# Patient Record
Sex: Male | Born: 1955 | Race: Black or African American | Hispanic: No | Marital: Single | Smoking: Current every day smoker
Health system: Southern US, Community
[De-identification: ages and names within clinical notes are randomized; demographics above are authoritative.]

## PROBLEM LIST (undated history)

## (undated) DIAGNOSIS — J449 Chronic obstructive pulmonary disease, unspecified: Secondary | ICD-10-CM

## (undated) DIAGNOSIS — I272 Pulmonary hypertension, unspecified: Secondary | ICD-10-CM

## (undated) DIAGNOSIS — F1020 Alcohol dependence, uncomplicated: Secondary | ICD-10-CM

## (undated) DIAGNOSIS — I5032 Chronic diastolic (congestive) heart failure: Secondary | ICD-10-CM

## (undated) DIAGNOSIS — F17200 Nicotine dependence, unspecified, uncomplicated: Secondary | ICD-10-CM

## (undated) DIAGNOSIS — I4892 Unspecified atrial flutter: Secondary | ICD-10-CM

## (undated) DIAGNOSIS — I10 Essential (primary) hypertension: Secondary | ICD-10-CM

## (undated) HISTORY — DX: Chronic diastolic (congestive) heart failure: I50.32

## (undated) HISTORY — DX: Chronic obstructive pulmonary disease, unspecified: J44.9

## (undated) HISTORY — DX: Unspecified atrial flutter: I48.92

## (undated) HISTORY — DX: Pulmonary hypertension, unspecified: I27.20

## (undated) HISTORY — PX: OTHER SURGICAL HISTORY: SHX169

---

## 2019-09-27 ENCOUNTER — Encounter (HOSPITAL_BASED_OUTPATIENT_CLINIC_OR_DEPARTMENT_OTHER): Payer: Self-pay | Admitting: *Deleted

## 2019-09-27 ENCOUNTER — Emergency Department (HOSPITAL_BASED_OUTPATIENT_CLINIC_OR_DEPARTMENT_OTHER): Payer: Medicare Other

## 2019-09-27 ENCOUNTER — Other Ambulatory Visit: Payer: Self-pay

## 2019-09-27 ENCOUNTER — Inpatient Hospital Stay (HOSPITAL_BASED_OUTPATIENT_CLINIC_OR_DEPARTMENT_OTHER)
Admission: EM | Admit: 2019-09-27 | Discharge: 2019-10-03 | DRG: 286 | Disposition: A | Discharge status: Home Health | Payer: Medicare Other | Attending: Internal Medicine | Admitting: Internal Medicine

## 2019-09-27 DIAGNOSIS — Z8249 Family history of ischemic heart disease and other diseases of the circulatory system: Secondary | ICD-10-CM

## 2019-09-27 DIAGNOSIS — T501X5A Adverse effect of loop [high-ceiling] diuretics, initial encounter: Secondary | ICD-10-CM | POA: Diagnosis not present

## 2019-09-27 DIAGNOSIS — F1010 Alcohol abuse, uncomplicated: Secondary | ICD-10-CM | POA: Diagnosis present

## 2019-09-27 DIAGNOSIS — T508X5A Adverse effect of diagnostic agents, initial encounter: Secondary | ICD-10-CM | POA: Diagnosis not present

## 2019-09-27 DIAGNOSIS — I130 Hypertensive heart and chronic kidney disease with heart failure and stage 1 through stage 4 chronic kidney disease, or unspecified chronic kidney disease: Secondary | ICD-10-CM | POA: Diagnosis present

## 2019-09-27 DIAGNOSIS — E8770 Fluid overload, unspecified: Secondary | ICD-10-CM

## 2019-09-27 DIAGNOSIS — I082 Rheumatic disorders of both aortic and tricuspid valves: Secondary | ICD-10-CM | POA: Diagnosis present

## 2019-09-27 DIAGNOSIS — F1020 Alcohol dependence, uncomplicated: Secondary | ICD-10-CM | POA: Diagnosis present

## 2019-09-27 DIAGNOSIS — I10 Essential (primary) hypertension: Secondary | ICD-10-CM | POA: Diagnosis present

## 2019-09-27 DIAGNOSIS — I509 Heart failure, unspecified: Secondary | ICD-10-CM

## 2019-09-27 DIAGNOSIS — I774 Celiac artery compression syndrome: Secondary | ICD-10-CM | POA: Diagnosis present

## 2019-09-27 DIAGNOSIS — I361 Nonrheumatic tricuspid (valve) insufficiency: Secondary | ICD-10-CM | POA: Diagnosis not present

## 2019-09-27 DIAGNOSIS — D6959 Other secondary thrombocytopenia: Secondary | ICD-10-CM | POA: Diagnosis present

## 2019-09-27 DIAGNOSIS — N179 Acute kidney failure, unspecified: Secondary | ICD-10-CM

## 2019-09-27 DIAGNOSIS — F1090 Alcohol use, unspecified, uncomplicated: Secondary | ICD-10-CM | POA: Diagnosis present

## 2019-09-27 DIAGNOSIS — I471 Supraventricular tachycardia: Secondary | ICD-10-CM | POA: Diagnosis present

## 2019-09-27 DIAGNOSIS — I4892 Unspecified atrial flutter: Secondary | ICD-10-CM

## 2019-09-27 DIAGNOSIS — N141 Nephropathy induced by other drugs, medicaments and biological substances: Secondary | ICD-10-CM | POA: Diagnosis not present

## 2019-09-27 DIAGNOSIS — I50814 Right heart failure due to left heart failure: Secondary | ICD-10-CM

## 2019-09-27 DIAGNOSIS — J449 Chronic obstructive pulmonary disease, unspecified: Secondary | ICD-10-CM | POA: Diagnosis present

## 2019-09-27 DIAGNOSIS — I519 Heart disease, unspecified: Secondary | ICD-10-CM

## 2019-09-27 DIAGNOSIS — E785 Hyperlipidemia, unspecified: Secondary | ICD-10-CM | POA: Diagnosis present

## 2019-09-27 DIAGNOSIS — F17210 Nicotine dependence, cigarettes, uncomplicated: Secondary | ICD-10-CM | POA: Diagnosis present

## 2019-09-27 DIAGNOSIS — I5031 Acute diastolic (congestive) heart failure: Secondary | ICD-10-CM

## 2019-09-27 DIAGNOSIS — B9689 Other specified bacterial agents as the cause of diseases classified elsewhere: Secondary | ICD-10-CM | POA: Diagnosis present

## 2019-09-27 DIAGNOSIS — F17200 Nicotine dependence, unspecified, uncomplicated: Secondary | ICD-10-CM | POA: Diagnosis present

## 2019-09-27 DIAGNOSIS — I5043 Acute on chronic combined systolic (congestive) and diastolic (congestive) heart failure: Secondary | ICD-10-CM

## 2019-09-27 DIAGNOSIS — I351 Nonrheumatic aortic (valve) insufficiency: Secondary | ICD-10-CM | POA: Diagnosis not present

## 2019-09-27 DIAGNOSIS — J9601 Acute respiratory failure with hypoxia: Secondary | ICD-10-CM | POA: Diagnosis present

## 2019-09-27 DIAGNOSIS — N189 Chronic kidney disease, unspecified: Secondary | ICD-10-CM | POA: Diagnosis present

## 2019-09-27 DIAGNOSIS — K761 Chronic passive congestion of liver: Secondary | ICD-10-CM | POA: Diagnosis present

## 2019-09-27 DIAGNOSIS — I4719 Other supraventricular tachycardia: Secondary | ICD-10-CM | POA: Diagnosis present

## 2019-09-27 DIAGNOSIS — F1011 Alcohol abuse, in remission: Secondary | ICD-10-CM | POA: Diagnosis present

## 2019-09-27 DIAGNOSIS — I2729 Other secondary pulmonary hypertension: Secondary | ICD-10-CM | POA: Diagnosis present

## 2019-09-27 DIAGNOSIS — I5082 Biventricular heart failure: Secondary | ICD-10-CM | POA: Diagnosis present

## 2019-09-27 DIAGNOSIS — I272 Pulmonary hypertension, unspecified: Secondary | ICD-10-CM

## 2019-09-27 DIAGNOSIS — Z20822 Contact with and (suspected) exposure to covid-19: Secondary | ICD-10-CM | POA: Diagnosis present

## 2019-09-27 DIAGNOSIS — R0602 Shortness of breath: Secondary | ICD-10-CM | POA: Diagnosis present

## 2019-09-27 DIAGNOSIS — R8271 Bacteriuria: Secondary | ICD-10-CM | POA: Diagnosis present

## 2019-09-27 DIAGNOSIS — I5033 Acute on chronic diastolic (congestive) heart failure: Secondary | ICD-10-CM

## 2019-09-27 DIAGNOSIS — R000 Tachycardia, unspecified: Secondary | ICD-10-CM | POA: Diagnosis not present

## 2019-09-27 DIAGNOSIS — R945 Abnormal results of liver function studies: Secondary | ICD-10-CM | POA: Diagnosis present

## 2019-09-27 DIAGNOSIS — F1029 Alcohol dependence with unspecified alcohol-induced disorder: Secondary | ICD-10-CM | POA: Diagnosis not present

## 2019-09-27 HISTORY — DX: Nicotine dependence, unspecified, uncomplicated: F17.200

## 2019-09-27 HISTORY — DX: Essential (primary) hypertension: I10

## 2019-09-27 HISTORY — DX: Alcohol dependence, uncomplicated: F10.20

## 2019-09-27 LAB — URINALYSIS, COMPLETE (UACMP) WITH MICROSCOPIC
Bilirubin Urine: NEGATIVE
Glucose, UA: NEGATIVE mg/dL
Ketones, ur: NEGATIVE mg/dL
Nitrite: NEGATIVE
Protein, ur: 100 mg/dL — AB
Specific Gravity, Urine: 1.02 (ref 1.005–1.030)
pH: 6 (ref 5.0–8.0)

## 2019-09-27 LAB — CBC WITH DIFFERENTIAL/PLATELET
Abs Immature Granulocytes: 0.03 10*3/uL (ref 0.00–0.07)
Basophils Absolute: 0 10*3/uL (ref 0.0–0.1)
Basophils Relative: 1 %
Eosinophils Absolute: 0 10*3/uL (ref 0.0–0.5)
Eosinophils Relative: 1 %
HCT: 40.4 % (ref 39.0–52.0)
Hemoglobin: 14.1 g/dL (ref 13.0–17.0)
Immature Granulocytes: 1 %
Lymphocytes Relative: 42 %
Lymphs Abs: 2.6 10*3/uL (ref 0.7–4.0)
MCH: 33.7 pg (ref 26.0–34.0)
MCHC: 34.9 g/dL (ref 30.0–36.0)
MCV: 96.7 fL (ref 80.0–100.0)
Monocytes Absolute: 0.8 10*3/uL (ref 0.1–1.0)
Monocytes Relative: 13 %
Neutro Abs: 2.5 10*3/uL (ref 1.7–7.7)
Neutrophils Relative %: 42 %
Platelets: 130 10*3/uL — ABNORMAL LOW (ref 150–400)
RBC: 4.18 MIL/uL — ABNORMAL LOW (ref 4.22–5.81)
RDW: 16.8 % — ABNORMAL HIGH (ref 11.5–15.5)
WBC: 6 10*3/uL (ref 4.0–10.5)
nRBC: 0.3 % — ABNORMAL HIGH (ref 0.0–0.2)

## 2019-09-27 LAB — HEPATIC FUNCTION PANEL
ALT: 84 U/L — ABNORMAL HIGH (ref 0–44)
AST: 71 U/L — ABNORMAL HIGH (ref 15–41)
Albumin: 3.7 g/dL (ref 3.5–5.0)
Alkaline Phosphatase: 71 U/L (ref 38–126)
Bilirubin, Direct: 0.7 mg/dL — ABNORMAL HIGH (ref 0.0–0.2)
Indirect Bilirubin: 0.8 mg/dL (ref 0.3–0.9)
Total Bilirubin: 1.5 mg/dL — ABNORMAL HIGH (ref 0.3–1.2)
Total Protein: 7.6 g/dL (ref 6.5–8.1)

## 2019-09-27 LAB — BRAIN NATRIURETIC PEPTIDE: B Natriuretic Peptide: 604.8 pg/mL — ABNORMAL HIGH (ref 0.0–100.0)

## 2019-09-27 LAB — BASIC METABOLIC PANEL
Anion gap: 11 (ref 5–15)
BUN: 22 mg/dL (ref 8–23)
CO2: 30 mmol/L (ref 22–32)
Calcium: 8.7 mg/dL — ABNORMAL LOW (ref 8.9–10.3)
Chloride: 97 mmol/L — ABNORMAL LOW (ref 98–111)
Creatinine, Ser: 1.64 mg/dL — ABNORMAL HIGH (ref 0.61–1.24)
GFR calc Af Amer: 50 mL/min — ABNORMAL LOW (ref >60)
GFR calc non Af Amer: 43 mL/min — ABNORMAL LOW (ref >60)
Glucose, Bld: 84 mg/dL (ref 70–99)
Potassium: 4.2 mmol/L (ref 3.5–5.1)
Sodium: 138 mmol/L (ref 135–145)

## 2019-09-27 LAB — SARS CORONAVIRUS 2 AG (30 MIN TAT): SARS Coronavirus 2 Ag: NEGATIVE

## 2019-09-27 LAB — PROTIME-INR
INR: 1 (ref 0.8–1.2)
Prothrombin Time: 12.9 seconds (ref 11.4–15.2)

## 2019-09-27 LAB — TROPONIN I (HIGH SENSITIVITY)
Troponin I (High Sensitivity): 70 ng/L — ABNORMAL HIGH (ref <18)
Troponin I (High Sensitivity): 65 ng/L — ABNORMAL HIGH (ref <18)

## 2019-09-27 LAB — D-DIMER, QUANTITATIVE: D-Dimer, Quant: 1.17 ug/mL-FEU — ABNORMAL HIGH (ref 0.00–0.50)

## 2019-09-27 MED ORDER — DILTIAZEM HCL-DEXTROSE 125-5 MG/125ML-% IV SOLN (PREMIX)
5.0000 mg/h | INTRAVENOUS | Status: DC
  Administered 2019-09-27: 10 mg/h via INTRAVENOUS
  Administered 2019-09-28 – 2019-09-29 (×4): 15 mg/h via INTRAVENOUS
  Filled 2019-09-27 (×5): qty 125

## 2019-09-27 MED ORDER — ADENOSINE 6 MG/2ML IV SOLN
INTRAVENOUS | Status: AC
  Administered 2019-09-27: 19:00:00 6 mg
  Filled 2019-09-27: qty 6

## 2019-09-27 MED ORDER — LORAZEPAM 2 MG/ML IJ SOLN: 0.0000 mg | Freq: Two times a day (BID) | INTRAMUSCULAR | Status: DC

## 2019-09-27 MED ORDER — LORAZEPAM 1 MG PO TABS: 0.0000 mg | ORAL_TABLET | Freq: Two times a day (BID) | ORAL | Status: DC

## 2019-09-27 MED ORDER — THIAMINE HCL 100 MG/ML IJ SOLN: 100.0000 mg | Freq: Every day | INTRAMUSCULAR | Status: DC

## 2019-09-27 MED ORDER — IOHEXOL 350 MG/ML SOLN
100.0000 mL | Freq: Once | INTRAVENOUS | Status: AC
  Administered 2019-09-27: 80 mL via INTRAVENOUS

## 2019-09-27 MED ORDER — LORAZEPAM 2 MG/ML IJ SOLN: 0.0000 mg | Freq: Four times a day (QID) | INTRAMUSCULAR | Status: DC

## 2019-09-27 MED ORDER — DILTIAZEM LOAD VIA INFUSION
10.0000 mg | Freq: Once | INTRAVENOUS | Status: AC
  Administered 2019-09-27: 10 mg via INTRAVENOUS
  Filled 2019-09-27: qty 10

## 2019-09-27 MED ORDER — FUROSEMIDE 10 MG/ML IJ SOLN: 40.0000 mg | Freq: Once | INTRAMUSCULAR | Status: DC

## 2019-09-27 MED ORDER — FUROSEMIDE 10 MG/ML IJ SOLN
40.0000 mg | Freq: Once | INTRAMUSCULAR | Status: AC
  Administered 2019-09-27: 40 mg via INTRAVENOUS
  Filled 2019-09-27: qty 4

## 2019-09-27 MED ORDER — LORAZEPAM 1 MG PO TABS: 0.0000 mg | ORAL_TABLET | Freq: Four times a day (QID) | ORAL | Status: DC

## 2019-09-27 MED ORDER — THIAMINE HCL 100 MG PO TABS
100.0000 mg | ORAL_TABLET | Freq: Every day | ORAL | Status: DC
  Administered 2019-09-28: 09:00:00 100 mg via ORAL
  Filled 2019-09-27: qty 1

## 2019-09-27 NOTE — ED Notes (Signed)
PT on monitor 

## 2019-09-27 NOTE — ED Notes (Signed)
Pt. Has Cardizem at 10mg  /hr going IV with no adverse affects.  Pt. Back to NSR  With Resp. At 14-18 and Sats 100% on 3 Liters [place].  Pt. In no distress at present time.  Pt. B/P still slightly elevated.  Pt. Lower extremities are edematous since Feb. With no relief.  Pt. Has not been taking care of self accordingly.  Pt. Legs up to thighs are swollen actually.  Pt. Does not use oxygen at home.  Pt. Has friend at bedside aware of Pt. Condition helping with Pt. History.

## 2019-09-27 NOTE — ED Triage Notes (Signed)
Both legs have been swelling x 3 days. His face is puffy. He feels SOB. He does not see a doctor. Hx of HTN.

## 2019-09-27 NOTE — ED Provider Notes (Addendum)
[facility] EMERGENCY DEPARTMENT Provider Note   CSN: [id number] Arrival date & time: 09/27/19  1833     History Chief Complaint  Patient presents with  . Leg Swelling  . Tachycardia    Galen Russman is a 64 y.o. male.  Patient states leg swelling increasing over the last several days, some shortness of breath, heavy alcohol use history.  Does not see a doctor regularly and does not take any medications.  Heart rate upon arrival 170s.  Patient denies any chest pain.  No headache, no abdominal pain.  The history is provided by the patient.  Shortness of Breath Severity:  Mild Onset quality:  Gradual Timing:  Constant Progression:  Unchanged Chronicity:  New Context: activity   Relieved by:  Nothing Worsened by:  Nothing Associated symptoms: no abdominal pain, no chest pain, no claudication, no cough, no diaphoresis, no ear pain, no fever, no headaches, no hemoptysis, no neck pain, no PND, no rash, no sore throat, no sputum production, no syncope, no swollen glands, no vomiting and no wheezing   Risk factors: alcohol use        Past Medical History:  Diagnosis Date  . Hypertension     There are no problems to display for this patient.   History reviewed. No pertinent surgical history.     No family history on file.  Social History   Tobacco Use  . Smoking status: Current Every Day Smoker  . Smokeless tobacco: Never Used  Substance Use Topics  . Alcohol use: Yes  . Drug use: Never    Home Medications Prior to Admission medications   Not on File    Allergies    Patient has no known allergies.  Review of Systems   Review of Systems  Constitutional: Negative for chills, diaphoresis and fever.  HENT: Negative for ear pain and sore throat.   Eyes: Negative for pain and visual disturbance.  Respiratory: Positive for shortness of breath. Negative for cough, hemoptysis, sputum production and wheezing.   Cardiovascular: Positive for leg swelling.  Negative for chest pain, palpitations, claudication, syncope and PND.  Gastrointestinal: Negative for abdominal pain and vomiting.  Genitourinary: Negative for dysuria and hematuria.  Musculoskeletal: Negative for arthralgias, back pain and neck pain.  Skin: Negative for color change and rash.  Neurological: Negative for seizures, syncope and headaches.  All other systems reviewed and are negative.   Physical Exam Updated Vital Signs BP (!) 154/116 (BP Location: Right Arm)   Pulse 96   Temp 98.2 F (36.8 C) (Oral)   Resp (!) 22   Ht 5' 9.5" (1.765 m)   Wt 81 kg   SpO2 94%   BMI 25.99 kg/m   Physical Exam Vitals and nursing note reviewed.  Constitutional:      General: He is not in acute distress.    Appearance: He is well-developed. He is not ill-appearing.  HENT:     Head: Normocephalic and atraumatic.     Nose: Nose normal.     Mouth/Throat:     Mouth: Mucous membranes are moist.  Eyes:     Extraocular Movements: Extraocular movements intact.     Conjunctiva/sclera: Conjunctivae normal.     Pupils: Pupils are equal, round, and reactive to light.  Cardiovascular:     Rate and Rhythm: Regular rhythm. Tachycardia present.     Heart sounds: Normal heart sounds. No murmur.  Pulmonary:     Effort: No respiratory distress.     Breath sounds: Wheezing  and rales present.     Comments: Mild increased work of breathing  Abdominal:     General: Abdomen is flat. There is no distension.     Palpations: Abdomen is soft. There is no mass.     Tenderness: There is no abdominal tenderness. There is no guarding or rebound.     Hernia: No hernia is present.  Musculoskeletal:     Cervical back: Neck supple.     Right lower leg: Edema (3+) present.     Left lower leg: Edema (3+) present.  Skin:    General: Skin is warm.     Capillary Refill: Capillary refill takes less than 2 seconds.  Neurological:     General: No focal deficit present.     Mental Status: He is alert.   Psychiatric:        Mood and Affect: Mood normal.     ED Results / Procedures / Treatments   Labs (all labs ordered are listed, but only abnormal results are displayed) Labs Reviewed  BASIC METABOLIC PANEL - Abnormal; Notable for the following components:      Result Value   Chloride 97 (*)    Creatinine, Ser 1.64 (*)    Calcium 8.7 (*)    GFR calc non Af Amer 43 (*)    GFR calc Af Amer 50 (*)    All other components within normal limits  CBC WITH DIFFERENTIAL/PLATELET - Abnormal; Notable for the following components:   RBC 4.18 (*)    RDW 16.8 (*)    Platelets 130 (*)    nRBC 0.3 (*)    All other components within normal limits  BRAIN NATRIURETIC PEPTIDE - Abnormal; Notable for the following components:   B Natriuretic Peptide 604.8 (*)    All other components within normal limits  HEPATIC FUNCTION PANEL - Abnormal; Notable for the following components:   AST 71 (*)    ALT 84 (*)    Total Bilirubin 1.5 (*)    Bilirubin, Direct 0.7 (*)    All other components within normal limits  D-DIMER, QUANTITATIVE (NOT AT [facility]) - Abnormal; Notable for the following components:   D-Dimer, Quant 1.17 (*)    All other components within normal limits  TROPONIN I (HIGH SENSITIVITY) - Abnormal; Notable for the following components:   Troponin I (High Sensitivity) 70 (*)    All other components within normal limits  SARS CORONAVIRUS 2 AG (30 MIN TAT)  URINE CULTURE  SARS CORONAVIRUS 2 (TAT 6-24 HRS)  PROTIME-INR  URINALYSIS, COMPLETE (UACMP) WITH MICROSCOPIC  TROPONIN I (HIGH SENSITIVITY)    EKG EKG Interpretation  Date/Time:  Monday September 27 2019 19:05:42 EDT Ventricular Rate:  175 PR Interval:    QRS Duration: 98 QT Interval:  297 QTC Calculation: 507 R Axis:   96 Text Interpretation: Supraventricular tachycardia Anterior infarct, age indeterminate Baseline wander in lead(s) II Confirmed by Virgina Norfolk [phone]) on 09/27/2019 7:45:27 PM   Radiology CT Angio Chest PE W  and/or Wo Contrast  Result Date: 09/27/2019 CLINICAL DATA:  Shortness of breath. Swelling of both legs and face. EXAM: CT ANGIOGRAPHY CHEST WITH CONTRAST TECHNIQUE: Multidetector CT imaging of the chest was performed using the standard protocol during bolus administration of intravenous contrast. Multiplanar CT image reconstructions and MIPs were obtained to evaluate the vascular anatomy. CONTRAST:  59mL OMNIPAQUE IOHEXOL 350 MG/ML SOLN COMPARISON:  None. FINDINGS: Cardiovascular: Contrast injection is sufficient to demonstrate satisfactory opacification of the pulmonary arteries to the segmental level.  There is no pulmonary embolus. The main pulmonary artery is within normal limits for size. There is no CT evidence of acute right heart strain. There are atherosclerotic changes of the thoracic aorta without evidence for dissection or aneurysm. Heart size is mildly enlarged. There is a trace pericardial effusion. Mediastinum/Nodes: --No mediastinal or hilar lymphadenopathy. --No axillary lymphadenopathy. --No supraclavicular lymphadenopathy. --Normal thyroid gland. --The esophagus is unremarkable Lungs/Pleura: There are moderate emphysematous changes. There is no pneumothorax. No pleural effusion. Upper Abdomen: There is a 1.9 cm cyst in the left hepatic lobe. There is a moderate grade stenosis of the origin of the celiac axis with poststenotic dilatation of the mid to distal celiac axis which currently measures approximately 1.4 cm. Musculoskeletal: There is an old healed sternal body fracture. Review of the MIP images confirms the above findings. IMPRESSION: 1. No evidence for pulmonary embolus. 2. Moderate emphysematous changes in the lungs. 3. 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 4. Aortic Atherosclerosis (ICD10-I70.0) and Emphysema (ICD10-J43.9). Electronically Signed   By:  Constance Holster M.D.   On: 09/27/2019 22:04   DG Chest Port 1 View  Result Date: 09/27/2019 CLINICAL DATA:  64 year old male with shortness of breath. EXAM: PORTABLE CHEST 1 VIEW COMPARISON:  Chest radiograph dated 11/28/2017 FINDINGS: No focal consolidation, pleural effusion, pneumothorax. There is mild cardiomegaly. Atherosclerotic calcification of the aorta. No acute osseous pathology. IMPRESSION: No active disease. Electronically Signed   By: Anner Crete M.D.   On: 09/27/2019 19:54    Procedures .Critical Care Performed by: Lennice Sites, DO Authorized by: Lennice Sites, DO   Critical care provider statement:    Critical care time (minutes):  45   Critical care was necessary to treat or prevent imminent or life-threatening deterioration of the following conditions:  Cardiac failure   Critical care was time spent personally by me on the following activities:  Blood draw for specimens, development of treatment plan with patient or surrogate, discussions with consultants, discussions with primary provider, evaluation of patient's response to treatment, examination of patient, obtaining history from patient or surrogate, ordering and performing treatments and interventions, ordering and review of laboratory studies, ordering and review of radiographic studies, pulse oximetry, re-evaluation of patient's condition and review of old charts   I assumed direction of critical care for this patient from another provider in my specialty: no     (including critical care time)  Medications Ordered in ED Medications  diltiazem (CARDIZEM) 1 mg/mL load via infusion 10 mg (10 mg Intravenous Bolus from Bag 09/27/19 2005)    And  diltiazem (CARDIZEM) 125 mg in dextrose 5% 125 mL (1 mg/mL) infusion (12 mg/hr Intravenous Rate/Dose Change 09/27/19 2210)  LORazepam (ATIVAN) injection 0-4 mg (has no administration in time range)    Or  LORazepam (ATIVAN) tablet 0-4 mg (has no administration in time range)   LORazepam (ATIVAN) injection 0-4 mg (has no administration in time range)    Or  LORazepam (ATIVAN) tablet 0-4 mg (has no administration in time range)  thiamine tablet 100 mg (has no administration in time range)    Or  thiamine (B-1) injection 100 mg (has no administration in time range)  furosemide (LASIX) injection 40 mg (has no administration in time range)  adenosine (ADENOCARD) 6 MG/2ML injection (6 mg  Given by Other 09/27/19 1922)  iohexol (OMNIPAQUE) 350 MG/ML injection 100 mL (80 mLs Intravenous Contrast  Given 09/27/19 2149)    ED Course  I have reviewed the triage vital signs and the nursing notes.  Pertinent labs & imaging results that were available during my care of the patient were reviewed by me and considered in my medical decision making (see chart for details).    MDM Rules/Calculators/A&P                       Spenser Harren is a 64 year old male with no significant medical history presents the ED with leg swelling, shortness of breath.  Vital signs upon arrival are significant for tachycardia to the 170s.  Otherwise some hypertension.  Patient placed on 2 L for comfort however greater than 90% on room air.  Patient tachypneic.  States that he has noticed increasing leg swelling bilaterally for the last several days.  EKG upon arrival shows SVT.  Patient was given 6 mg of adenosine and heart rate slowed down enough to show that likely has atrial tachycardia.  Went right back into an SVT rhythm.  Discussed EKG on the phone with cardiology and also likely atrial tachycardia.  Patient to be started on IV diltiazem bolus and infusion.  Patient appears to have volume overload on exam.  3+ pitting edema.  No chest pain.  Has some coarse breath sounds on exam may be some wheezing.  However chest x-ray shows mild cardiomegaly but no major effusions.  BNP is 604 and troponin is 70.  While on diltiazem infusion patient did convert to a normal sinus rhythm.  No ischemic changes.  No chest  pain.  No significant anemia, electrolyte abnormality except for mild elevation in creatinine.  No leukocytosis.  No obvious pneumonia.  Likely volume overload in the setting of heavy alcohol abuse.  Also possibly due to arrhythmia.  However will further evaluate with D-dimer for PE.  D-dimer is elevated.  We will get a CT PE scan to further rule out PE.  Patient on 2 L of oxygen and continues to be stable in a normal sinus rhythm. First COVID test is negative.  CT scan negative for PE.  To be admitted for further care for acute hypoxic respiratory failure likely from volume overload.   This chart was dictated using voice recognition software.  Despite best efforts to proofread,  errors can occur which can change the documentation meaning.    Brahm Barbeau was evaluated in Emergency Department on 09/27/2019 for the symptoms described in the history of present illness. He was evaluated in the context of the global COVID-19 pandemic, which necessitated consideration that the patient might be at risk for infection with the SARS-CoV-2 virus that causes COVID-19. Institutional protocols and algorithms that pertain to the evaluation of patients at risk for COVID-19 are in a state of rapid change based on information released by regulatory bodies including the CDC and federal and state organizations. These policies and algorithms were followed during the patient's care in the ED.    Final Clinical Impression(s) / ED Diagnoses Final diagnoses:  Tachycardia  Hypervolemia, unspecified hypervolemia type  Acute respiratory failure with hypoxia [facility])    Rx / DC Orders ED Discharge Orders    None       Virgina Norfolk, DO 09/27/19 2211    Virgina Norfolk, DO 09/27/19 2211

## 2019-09-27 NOTE — ED Notes (Signed)
ED Provider at bedside. 

## 2019-09-27 NOTE — ED Notes (Signed)
[facility] notified Aaron Fuller) - to repage hospitalist that was on the line previously

## 2019-09-27 NOTE — ED Notes (Signed)
HR 177 at 1922 - given 6 of adenosine, pt HR down to 88, unsustained, pt HR Returned to 175.

## 2019-09-27 NOTE — Progress Notes (Signed)
Patient is currently on a 2 liter nasal cannula with a SPO2 of 96%.

## 2019-09-27 NOTE — ED Notes (Signed)
Patient denies pain and is resting comfortably.  

## 2019-09-27 NOTE — ED Notes (Signed)
[facility] notified (Tara) - patient ready for transport 

## 2019-09-28 ENCOUNTER — Encounter (HOSPITAL_COMMUNITY): Payer: Self-pay | Admitting: Internal Medicine

## 2019-09-28 ENCOUNTER — Inpatient Hospital Stay (HOSPITAL_COMMUNITY): Payer: Medicare Other

## 2019-09-28 DIAGNOSIS — I4892 Unspecified atrial flutter: Secondary | ICD-10-CM

## 2019-09-28 DIAGNOSIS — I272 Pulmonary hypertension, unspecified: Secondary | ICD-10-CM

## 2019-09-28 DIAGNOSIS — I361 Nonrheumatic tricuspid (valve) insufficiency: Secondary | ICD-10-CM

## 2019-09-28 DIAGNOSIS — I50814 Right heart failure due to left heart failure: Secondary | ICD-10-CM

## 2019-09-28 DIAGNOSIS — F1020 Alcohol dependence, uncomplicated: Secondary | ICD-10-CM | POA: Diagnosis present

## 2019-09-28 DIAGNOSIS — I351 Nonrheumatic aortic (valve) insufficiency: Secondary | ICD-10-CM

## 2019-09-28 DIAGNOSIS — F1010 Alcohol abuse, uncomplicated: Secondary | ICD-10-CM | POA: Diagnosis present

## 2019-09-28 DIAGNOSIS — I5031 Acute diastolic (congestive) heart failure: Secondary | ICD-10-CM

## 2019-09-28 DIAGNOSIS — I509 Heart failure, unspecified: Secondary | ICD-10-CM

## 2019-09-28 DIAGNOSIS — F1011 Alcohol abuse, in remission: Secondary | ICD-10-CM | POA: Diagnosis present

## 2019-09-28 DIAGNOSIS — R000 Tachycardia, unspecified: Secondary | ICD-10-CM

## 2019-09-28 DIAGNOSIS — F1090 Alcohol use, unspecified, uncomplicated: Secondary | ICD-10-CM | POA: Diagnosis present

## 2019-09-28 DIAGNOSIS — I5033 Acute on chronic diastolic (congestive) heart failure: Secondary | ICD-10-CM

## 2019-09-28 DIAGNOSIS — I5043 Acute on chronic combined systolic (congestive) and diastolic (congestive) heart failure: Secondary | ICD-10-CM

## 2019-09-28 DIAGNOSIS — F17200 Nicotine dependence, unspecified, uncomplicated: Secondary | ICD-10-CM | POA: Diagnosis present

## 2019-09-28 DIAGNOSIS — I10 Essential (primary) hypertension: Secondary | ICD-10-CM | POA: Diagnosis present

## 2019-09-28 DIAGNOSIS — I519 Heart disease, unspecified: Secondary | ICD-10-CM

## 2019-09-28 LAB — CBC
HCT: 39.2 % (ref 39.0–52.0)
Hemoglobin: 13.6 g/dL (ref 13.0–17.0)
MCH: 33.2 pg (ref 26.0–34.0)
MCHC: 34.7 g/dL (ref 30.0–36.0)
MCV: 95.6 fL (ref 80.0–100.0)
Platelets: 125 K/uL — ABNORMAL LOW (ref 150–400)
RBC: 4.1 MIL/uL — ABNORMAL LOW (ref 4.22–5.81)
RDW: 16.5 % — ABNORMAL HIGH (ref 11.5–15.5)
WBC: 4.7 K/uL (ref 4.0–10.5)
nRBC: 0.4 % — ABNORMAL HIGH (ref 0.0–0.2)

## 2019-09-28 LAB — COMPREHENSIVE METABOLIC PANEL WITH GFR
ALT: 76 U/L — ABNORMAL HIGH (ref 0–44)
AST: 67 U/L — ABNORMAL HIGH (ref 15–41)
Albumin: 3.4 g/dL — ABNORMAL LOW (ref 3.5–5.0)
Alkaline Phosphatase: 70 U/L (ref 38–126)
Anion gap: 12 (ref 5–15)
BUN: 14 mg/dL (ref 8–23)
CO2: 35 mmol/L — ABNORMAL HIGH (ref 22–32)
Calcium: 8.7 mg/dL — ABNORMAL LOW (ref 8.9–10.3)
Chloride: 95 mmol/L — ABNORMAL LOW (ref 98–111)
Creatinine, Ser: 1.36 mg/dL — ABNORMAL HIGH (ref 0.61–1.24)
GFR calc Af Amer: >60 mL/min (ref >60)
GFR calc non Af Amer: 54 mL/min — ABNORMAL LOW (ref >60)
Glucose, Bld: 75 mg/dL (ref 70–99)
Potassium: 4 mmol/L (ref 3.5–5.1)
Sodium: 142 mmol/L (ref 135–145)
Total Bilirubin: 3.3 mg/dL — ABNORMAL HIGH (ref 0.3–1.2)
Total Protein: 7.1 g/dL (ref 6.5–8.1)

## 2019-09-28 LAB — HIV ANTIBODY (ROUTINE TESTING W REFLEX): HIV Screen 4th Generation wRfx: NONREACTIVE

## 2019-09-28 LAB — SARS CORONAVIRUS 2 (TAT 6-24 HRS): SARS Coronavirus 2: NEGATIVE

## 2019-09-28 LAB — PROTIME-INR
INR: 1.1 (ref 0.8–1.2)
Prothrombin Time: 14 seconds (ref 11.4–15.2)

## 2019-09-28 LAB — PHOSPHORUS: Phosphorus: 3.5 mg/dL (ref 2.5–4.6)

## 2019-09-28 LAB — LIPID PANEL
Cholesterol: 186 mg/dL (ref 0–200)
HDL: 101 mg/dL (ref >40)
LDL Cholesterol: 71 mg/dL (ref 0–99)
Total CHOL/HDL Ratio: 1.8 RATIO
Triglycerides: 71 mg/dL (ref <150)
VLDL: 14 mg/dL (ref 0–40)

## 2019-09-28 LAB — MAGNESIUM: Magnesium: 0.8 mg/dL — CL (ref 1.7–2.4)

## 2019-09-28 LAB — TSH: TSH: 0.914 u[IU]/mL (ref 0.350–4.500)

## 2019-09-28 LAB — ECHOCARDIOGRAM COMPLETE
Height: 69 in
Weight: 2744 oz

## 2019-09-28 LAB — HEMOGLOBIN A1C
Hgb A1c MFr Bld: 5.2 % (ref 4.8–5.6)
Mean Plasma Glucose: 102.54 mg/dL

## 2019-09-28 MED ORDER — SODIUM CHLORIDE 0.9% FLUSH: 3.0000 mL | Freq: Two times a day (BID) | INTRAVENOUS | Status: DC

## 2019-09-28 MED ORDER — HYDROCODONE-ACETAMINOPHEN 5-325 MG PO TABS: 1.0000 | ORAL_TABLET | ORAL | Status: DC | PRN

## 2019-09-28 MED ORDER — ZOLPIDEM TARTRATE 5 MG PO TABS
5.0000 mg | ORAL_TABLET | Freq: Every evening | ORAL | Status: DC | PRN
  Administered 2019-09-29 – 2019-09-30 (×2): 5 mg via ORAL
  Filled 2019-09-28 (×2): qty 1

## 2019-09-28 MED ORDER — SODIUM CHLORIDE 0.9 % IV SOLN: 250.0000 mL | INTRAVENOUS | Status: DC | PRN

## 2019-09-28 MED ORDER — ASPIRIN 81 MG PO CHEW
81.0000 mg | CHEWABLE_TABLET | ORAL | Status: AC
  Administered 2019-09-29: 81 mg via ORAL
  Filled 2019-09-28: qty 1

## 2019-09-28 MED ORDER — ENOXAPARIN SODIUM 40 MG/0.4ML ~~LOC~~ SOLN: 40.0000 mg | SUBCUTANEOUS | Status: DC

## 2019-09-28 MED ORDER — ENOXAPARIN SODIUM 40 MG/0.4ML ~~LOC~~ SOLN
40.0000 mg | SUBCUTANEOUS | Status: DC
  Administered 2019-09-28: 40 mg via SUBCUTANEOUS
  Filled 2019-09-28 (×2): qty 0.4

## 2019-09-28 MED ORDER — ASPIRIN EC 81 MG PO TBEC
81.0000 mg | DELAYED_RELEASE_TABLET | Freq: Every day | ORAL | Status: DC
  Filled 2019-09-28: qty 1

## 2019-09-28 MED ORDER — ADULT MULTIVITAMIN W/MINERALS CH
1.0000 | ORAL_TABLET | Freq: Every day | ORAL | Status: DC
  Administered 2019-09-28 – 2019-10-03 (×6): 1 via ORAL
  Filled 2019-09-28 (×6): qty 1

## 2019-09-28 MED ORDER — FOLIC ACID 1 MG PO TABS
1.0000 mg | ORAL_TABLET | Freq: Every day | ORAL | Status: DC
  Administered 2019-09-28 – 2019-10-03 (×6): 1 mg via ORAL
  Filled 2019-09-28 (×6): qty 1

## 2019-09-28 MED ORDER — BISACODYL 5 MG PO TBEC: 5.0000 mg | DELAYED_RELEASE_TABLET | Freq: Every day | ORAL | Status: DC | PRN

## 2019-09-28 MED ORDER — THIAMINE HCL 100 MG PO TABS
100.0000 mg | ORAL_TABLET | Freq: Every day | ORAL | Status: DC
  Administered 2019-09-29 – 2019-10-03 (×5): 100 mg via ORAL
  Filled 2019-09-28 (×5): qty 1

## 2019-09-28 MED ORDER — MORPHINE SULFATE (PF) 2 MG/ML IV SOLN: 2.0000 mg | INTRAVENOUS | Status: DC | PRN

## 2019-09-28 MED ORDER — SODIUM CHLORIDE 0.9% FLUSH
3.0000 mL | Freq: Two times a day (BID) | INTRAVENOUS | Status: DC
  Administered 2019-09-28 – 2019-10-02 (×2): 3 mL via INTRAVENOUS

## 2019-09-28 MED ORDER — POLYETHYLENE GLYCOL 3350 17 G PO PACK: 17.0000 g | PACK | Freq: Every day | ORAL | Status: DC | PRN

## 2019-09-28 MED ORDER — ONDANSETRON HCL 4 MG PO TABS: 4.0000 mg | ORAL_TABLET | Freq: Four times a day (QID) | ORAL | Status: DC | PRN

## 2019-09-28 MED ORDER — DOCUSATE SODIUM 100 MG PO CAPS
100.0000 mg | ORAL_CAPSULE | Freq: Two times a day (BID) | ORAL | Status: DC
  Administered 2019-09-28 – 2019-10-03 (×10): 100 mg via ORAL
  Filled 2019-09-28 (×11): qty 1

## 2019-09-28 MED ORDER — HEPARIN BOLUS VIA INFUSION
4000.0000 [IU] | Freq: Once | INTRAVENOUS | Status: DC
  Filled 2019-09-28: qty 4000

## 2019-09-28 MED ORDER — SODIUM CHLORIDE 0.9% FLUSH: 3.0000 mL | INTRAVENOUS | Status: DC | PRN

## 2019-09-28 MED ORDER — NICOTINE 14 MG/24HR TD PT24
14.0000 mg | MEDICATED_PATCH | Freq: Every day | TRANSDERMAL | Status: DC
  Administered 2019-09-28 – 2019-10-01 (×4): 14 mg via TRANSDERMAL
  Filled 2019-09-28 (×6): qty 1

## 2019-09-28 MED ORDER — HYDRALAZINE HCL 20 MG/ML IJ SOLN: 5.0000 mg | INTRAMUSCULAR | Status: DC | PRN

## 2019-09-28 MED ORDER — HEPARIN (PORCINE) 25000 UT/250ML-% IV SOLN
1100.0000 [IU]/h | INTRAVENOUS | Status: DC
  Administered 2019-09-28 – 2019-09-30 (×2): 1100 [IU]/h via INTRAVENOUS
  Filled 2019-09-28 (×2): qty 250

## 2019-09-28 MED ORDER — METOPROLOL TARTRATE 12.5 MG HALF TABLET
12.5000 mg | ORAL_TABLET | Freq: Two times a day (BID) | ORAL | Status: DC
  Administered 2019-09-28 – 2019-09-29 (×2): 12.5 mg via ORAL
  Filled 2019-09-28 (×2): qty 1

## 2019-09-28 MED ORDER — THIAMINE HCL 100 MG/ML IJ SOLN
100.0000 mg | Freq: Every day | INTRAMUSCULAR | Status: DC
  Filled 2019-09-28: qty 2

## 2019-09-28 MED ORDER — SODIUM CHLORIDE 0.9 % IV SOLN: INTRAVENOUS | Status: DC

## 2019-09-28 MED ORDER — ACETAMINOPHEN 650 MG RE SUPP: 650.0000 mg | Freq: Four times a day (QID) | RECTAL | Status: DC | PRN

## 2019-09-28 MED ORDER — FUROSEMIDE 10 MG/ML IJ SOLN
40.0000 mg | Freq: Two times a day (BID) | INTRAMUSCULAR | Status: DC
  Administered 2019-09-28 – 2019-09-29 (×3): 40 mg via INTRAVENOUS
  Filled 2019-09-28 (×4): qty 4

## 2019-09-28 MED ORDER — ACETAMINOPHEN 325 MG PO TABS: 650.0000 mg | ORAL_TABLET | Freq: Four times a day (QID) | ORAL | Status: DC | PRN

## 2019-09-28 MED ORDER — ONDANSETRON HCL 4 MG/2ML IJ SOLN: 4.0000 mg | Freq: Four times a day (QID) | INTRAMUSCULAR | Status: DC | PRN

## 2019-09-28 MED ORDER — ATORVASTATIN CALCIUM 10 MG PO TABS
20.0000 mg | ORAL_TABLET | Freq: Every day | ORAL | Status: DC
  Administered 2019-09-28 – 2019-10-02 (×5): 20 mg via ORAL
  Filled 2019-09-28 (×5): qty 2

## 2019-09-28 MED ORDER — LORAZEPAM 2 MG/ML IJ SOLN: 1.0000 mg | INTRAMUSCULAR | Status: DC | PRN

## 2019-09-28 MED ORDER — ASPIRIN EC 81 MG PO TBEC
81.0000 mg | DELAYED_RELEASE_TABLET | Freq: Every day | ORAL | Status: DC
  Administered 2019-09-28: 81 mg via ORAL
  Filled 2019-09-28: qty 1

## 2019-09-28 MED ORDER — MAGNESIUM OXIDE 400 (241.3 MG) MG PO TABS
400.0000 mg | ORAL_TABLET | Freq: Two times a day (BID) | ORAL | Status: DC
  Administered 2019-09-28 – 2019-10-03 (×11): 400 mg via ORAL
  Filled 2019-09-28 (×11): qty 1

## 2019-09-28 MED ORDER — MAGNESIUM SULFATE 2 GM/50ML IV SOLN
2.0000 g | Freq: Once | INTRAVENOUS | Status: AC
  Administered 2019-09-28: 13:00:00 2 g via INTRAVENOUS
  Filled 2019-09-28: qty 50

## 2019-09-28 MED ORDER — LORAZEPAM 1 MG PO TABS: 1.0000 mg | ORAL_TABLET | ORAL | Status: DC | PRN

## 2019-09-28 NOTE — Progress Notes (Signed)
MD made aware, pts. Mg 0.8

## 2019-09-28 NOTE — Progress Notes (Addendum)
ANTICOAGULATION CONSULT NOTE - Initial Consult  Pharmacy Consult for Heparin Indication: atrial fibrillation  Allergies  Allergen Reactions  . Latex Rash    Patient Measurements: Height: 5\' 9"  (175.3 cm) Weight: 77.8 kg (171 lb 8 oz)(scale b) IBW/kg (Calculated) : 70.7 Heparin Dosing Weight: 77.8 kg  Vital Signs: Temp: 98.3 F (36.8 C) (04/06 2023) Temp Source: Oral (04/06 2023) BP: 155/90 (04/06 2023) Pulse Rate: 94 (04/06 2023)  Labs: Recent Labs    09/27/19 1912 09/27/19 2017 09/27/19 2208 09/28/19 1104  HGB 14.1  --   --  13.6  HCT 40.4  --   --  39.2  PLT 130*  --   --  125*  LABPROT  --  12.9  --  14.0  INR  --  1.0  --  1.1  CREATININE 1.64*  --   --  1.36*  TROPONINIHS 70*  --  65*  --     Estimated Creatinine Clearance: 54.2 mL/min (A) (by C-G formula based on SCr of 1.36 mg/dL (H)).   Medical History: Past Medical History:  Diagnosis Date  . Alcohol dependence (HCC)   . Hypertension   . Tobacco dependence     Assessment: 64 year old male with SVT vs atrial tachycardia/flutter now converted to NSR. Pharmacy consulted for IV Heparin with plans to transition to ELiquis after cath per Cardiology since duration of flutter with RVR unknown. Not on anticoagulants prior to admission. H/H within normal limits. Platelets low on admission - 125 currently, per Dr. 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 INR 1.1 - baseline.  SCr elevated. Did receive Lovenox 40mg  at 2121 PM. Plan for Cath in AM.   Goal of Therapy:  Heparin level 0.3-0.7 units/ml Monitor platelets by anticoagulation protocol: Yes   Plan:  Start Heparin at 1100 units/hr.  No bolus due to recent Lovenox and low Plts.  Heparin level in 6 hours.  Daily Heparin level and CBC while on therapy Follow-up Cath tomorrow- timing not yet available.   , PharmD, BCPS, BCCCP Clinical Pharmacist Please refer to [facility] for [facility]  numbers 09/28/2019,10:39 PM

## 2019-09-28 NOTE — Plan of Care (Signed)

## 2019-09-28 NOTE — ED Notes (Signed)
Spoke with EDP about Pt. Need for ativan and thiamine.[name] KitchenMarland KitchenConsider for tomorrow if Pt. Still in ED due to Pt. Having no symptoms tonight and CIWA is 0 now.

## 2019-09-28 NOTE — TOC Progression Note (Signed)
Transition of Care [facility]) - Progression Note    Patient Details  Name: Aaron Fuller MRN: [id number] Date of Birth: 1956/03/06  Transition of Care [facility] CM/SW [place], Schley Phone Number: 09/28/2019, 12:38 PM  Clinical Narrative:    CSW received a consult from RN to speak with pt.  CSW met with pt and girlfriend at bedside.  Pt's girlfriend would like to complete HPOA.  CSW updated Spiritual Care with request.  No further needs noted.  Please consult [place] again if a need arise.  CSW signing off.        Expected Discharge Plan and Services                                                 Social Determinants of Health (SDOH) Interventions    Readmission Risk Interventions No flowsheet data found.

## 2019-09-28 NOTE — Consult Note (Signed)
Cardiology Consultation:   Patient ID: Aaron Fuller MRN: [id number]; DOB: 1955/06/29  Admit date: 09/27/2019 Date of Consult: 09/28/2019  Primary Care Provider: Patient, No Pcp Per Primary Cardiologist: Fransico Him, MD  Primary Electrophysiologist:  None    Patient Profile:   Aaron Fuller is a 64 y.o. male with a hx of HTN, tobacco use, and ETOH dependency who is being seen today for the evaluation of new onset heart failure at the request of Dr. Lorin Mercy.  History of Present Illness:   Aaron Fuller has not been seen by cardiology in the past. No history of MI, stent, arrythmia, stroke, diabetes, hyperlipidemia. Family history positive for MI in father at 66 and diabetes and MI in mother. No drug use. 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 He has smoked most of his adult life. Patient lives with his girlfriend as is able to care for himself.  The patient presented to [facility] for shortness of breath and lower leg edema for the last week. States legs have been getting much bigger and feeling heavy. Also reports orthopnea and PND. Denied chest pain, fever, chills, N/V, bowel changes.    On arrival he was tachycardic with rates in the 170s, possible SVT. He was also hypertensive. He was given adenosine 6mg  and subsequent rhythm reviewed by cardiology to be aflutter. Noted to have edema on exam and was hypoxic placed on supplemental oxygen. BNP elevated to 604. Creatinine 1.64, potassium 4.2, glucose 84, albumin 3.7. AST 71 and ALT 84. WBC 6.0, Hgb 14.1.  Hs troponin 70>65 CXR unremarkable. CTA chest negative for PE. He was started on IV dilt and given IV lasix and transferred to [facility] for further work-up.   Patient is feeling better today. States swelling in his legs has improved and breathing is better. No chest pain or history of chest pain. He plans to quit smoking and drinking ETOH.    Past Medical History:  Diagnosis Date  . Alcohol dependence ([place])    . Hypertension   . Tobacco dependence     Past Surgical History:  Procedure Laterality Date  . ulcer surgery       Home Medications:  Prior to Admission medications   Not on File    Inpatient Medications: Scheduled Meds: . aspirin EC  81 mg Oral Daily  . atorvastatin  20 mg Oral q1800  . docusate sodium  100 mg Oral BID  . enoxaparin (LOVENOX) injection  40 mg Subcutaneous Q24H  . folic acid  1 mg Oral Daily  . furosemide  40 mg Intravenous Q12H  . magnesium oxide  400 mg Oral BID  . metoprolol tartrate  12.5 mg Oral BID  . multivitamin with minerals  1 tablet Oral Daily  . nicotine  14 mg Transdermal Daily  . sodium chloride flush  3 mL Intravenous Q12H  . thiamine  100 mg Oral Daily   Or  . thiamine  100 mg Intravenous Daily   Continuous Infusions: . sodium chloride    . diltiazem (CARDIZEM) infusion 15 mg/hr (09/28/19 1254)   PRN Meds: sodium chloride, acetaminophen **OR** acetaminophen, bisacodyl, hydrALAZINE, HYDROcodone-acetaminophen, LORazepam **OR** LORazepam, morphine injection, ondansetron **OR** ondansetron (ZOFRAN) IV, polyethylene glycol, sodium chloride flush, zolpidem  Allergies:   No Known Allergies  Social History:   Social History   Socioeconomic History  . Marital status: Single    Spouse name: Not on file  . Number of children: Not on file  .  Years of education: Not on file  . Highest education level: Not on file  Occupational History  . Occupation: retired Investment banker, operational  Tobacco Use  . Smoking status: Current Every Day Smoker    Packs/day: 1.00    Years: 49.00    Pack years: 49.00  . Smokeless tobacco: Never Used  . Tobacco comment: currently 0.5  Substance and Sexual Activity  . Alcohol use: Yes    Comment: drinks 1 pint a day, last drink 2-3 days ago  . Drug use: Never  . Sexual activity: Not on file  Other Topics Concern  . Not on file  Social History Narrative  . Not on file   Social Determinants of Health   Financial Resource  Strain:   . Difficulty of Paying Living Expenses:   Food Insecurity:   . Worried About Programme researcher, broadcasting/film/video in the Last Year:   . Barista in the Last Year:   Transportation Needs:   . Freight forwarder (Medical):   Aaron Kitchen Lack of Transportation (Non-Medical):   Physical Activity:   . Days of Exercise per Week:   . Minutes of Exercise per Session:   Stress:   . Feeling of Stress :   Social Connections:   . Frequency of Communication with Friends and Family:   . Frequency of Social Gatherings with Friends and Family:   . Attends Religious Services:   . Active Member of Clubs or Organizations:   . Attends Banker Meetings:   Aaron Kitchen Marital Status:   Intimate Partner Violence:   . Fear of Current or Ex-Partner:   . Emotionally Abused:   Aaron Kitchen Physically Abused:   . Sexually Abused:     Family History:   Family History  Problem Relation Age of Onset  . CAD Mother   . Diabetes Mother   . Heart failure Mother   . CAD Father   . Diabetes Sister      ROS:  Please see the history of present illness.  All other ROS reviewed and negative.     Physical Exam/Data:   Vitals:   09/28/19 0423 09/28/19 0727 09/28/19 1052 09/28/19 1621  BP: (!) 160/77 (!) 148/83 (!) 152/93 (!) 164/100  Pulse: 89 88 87 91  Resp: 20 20 19 19   Temp: 98.1 F (36.7 C) 98.6 F (37 C) 98.5 F (36.9 C) 97.8 F (36.6 C)  TempSrc: Oral Oral Oral Oral  SpO2: 97% 97% 92% 97%  Weight:      Height:        Intake/Output Summary (Last 24 hours) at 09/28/2019 1707 Last data filed at 09/28/2019 1621 Gross per 24 hour  Intake 471.09 ml  Output 5510 ml  Net -5038.91 ml   Last 3 Weights 09/28/2019 09/27/2019  Weight (lbs) 171 lb 8 oz 178 lb 9.2 oz  Weight (kg) 77.792 kg 81 kg     Body mass index is 25.33 kg/m.  General:  Well nourished, well developed, in no acute distress HEENT: normal Lymph: no adenopathy Neck: + JVD Endocrine:  No thryomegaly Vascular: No carotid bruits; FA pulses 2+  bilaterally without bruits  Cardiac:  normal S1, S2; RRR; no murmur  Lungs:  Diffusely diminished breath sounds, diffuse wheezing  Abd: soft, nontender, no hepatomegaly  Ext: 1+ edema Musculoskeletal:  No deformities, BUE and BLE strength normal and equal Skin: warm and dry  Neuro:  CNs 2-12 intact, no focal abnormalities noted Psych:  Normal affect   EKG:  The EKG was personally reviewed and demonstrates:  NSR,  Telemetry:  Telemetry was personally reviewed and demonstrates:  NSR< HR 80-90s, some elevation 120-150, unsure if this is SVT vs aflutter, back in sinus rhythm.   Relevant CV Studies:  Echo  1. Left ventricular ejection fraction, by estimation, is 55 to 60%. The  left ventricle has normal function. The left ventricle has no regional  wall motion abnormalities. There is mild concentric left ventricular  hypertrophy. Left ventricular diastolic  parameters are consistent with Grade I diastolic dysfunction (impaired  relaxation).  2. Right ventricular systolic function is moderately reduced. The right  ventricular size is moderately enlarged. There is moderately elevated  pulmonary artery systolic pressure. The estimated right ventricular  systolic pressure is 54.7 mmHg.  3. Right atrial size was moderately dilated.  4. The mitral valve is normal in structure. No evidence of mitral valve  regurgitation.  5. The aortic valve is tricuspid. Aortic valve regurgitation is moderate.  No aortic stenosis is present.  6. The inferior vena cava is dilated in size with <50% respiratory  variability, suggesting right atrial pressure of 15 mmHg.  Laboratory Data:  High Sensitivity Troponin:   Recent Labs  Lab 09/27/19 1912 09/27/19 2208  TROPONINIHS 70* 65*     Chemistry Recent Labs  Lab 09/27/19 1912 09/28/19 1104  NA 138 142  K 4.2 4.0  CL 97* 95*  CO2 30 35*  GLUCOSE 84 75  BUN 22 14  CREATININE 1.64* 1.36*  CALCIUM 8.7* 8.7*  GFRNONAA 43* 54*  GFRAA 50* >60   ANIONGAP 11 12    Recent Labs  Lab 09/27/19 1912 09/28/19 1104  PROT 7.6 7.1  ALBUMIN 3.7 3.4*  AST 71* 67*  ALT 84* 76*  ALKPHOS 71 70  BILITOT 1.5* 3.3*   Hematology Recent Labs  Lab 09/27/19 1912 09/28/19 1104  WBC 6.0 4.7  RBC 4.18* 4.10*  HGB 14.1 13.6  HCT 40.4 39.2  MCV 96.7 95.6  MCH 33.7 33.2  MCHC 34.9 34.7  RDW 16.8* 16.5*  PLT 130* 125*   BNP Recent Labs  Lab 09/27/19 1912  BNP 604.8*    DDimer  Recent Labs  Lab 09/27/19 2017  DDIMER 1.17*     Radiology/Studies:  CT Angio Chest PE W and/or Wo Contrast  Result Date: 09/27/2019 CLINICAL DATA:  Shortness of breath. Swelling of both legs and face. EXAM: CT ANGIOGRAPHY CHEST WITH CONTRAST TECHNIQUE: Multidetector CT imaging of the chest was performed using the standard protocol during bolus administration of intravenous contrast. Multiplanar CT image reconstructions and MIPs were obtained to evaluate the vascular anatomy. CONTRAST:  54mL OMNIPAQUE IOHEXOL 350 MG/ML SOLN COMPARISON:  None. FINDINGS: Cardiovascular: Contrast injection is sufficient to demonstrate satisfactory opacification of the pulmonary arteries to the segmental level. There is no pulmonary embolus. The main pulmonary artery is within normal limits for size. There is no CT evidence of acute right heart strain. There are atherosclerotic changes of the thoracic aorta without evidence for dissection or aneurysm. Heart size is mildly enlarged. There is a trace pericardial effusion. Mediastinum/Nodes: --No mediastinal or hilar lymphadenopathy. --No axillary lymphadenopathy. --No supraclavicular lymphadenopathy. --Normal thyroid gland. --The esophagus is unremarkable Lungs/Pleura: There are moderate emphysematous changes. There is no pneumothorax. No pleural effusion. Upper Abdomen: There is a 1.9 cm cyst in the left hepatic lobe. There is a moderate grade stenosis of the origin of the celiac axis with poststenotic dilatation of the mid to distal  celiac axis which currently  measures approximately 1.4 cm. Musculoskeletal: There is an old healed sternal body fracture. Review of the MIP images confirms the above findings. IMPRESSION: 1. No evidence for pulmonary embolus. 2. Moderate emphysematous changes in the lungs. 3. 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 4. Aortic Atherosclerosis (ICD10-I70.0) and Emphysema (ICD10-J43.9). Electronically Signed   By: Katherine Mantle M.D.   On: 09/27/2019 22:04   DG Chest Port 1 View  Result Date: 09/27/2019 CLINICAL DATA:  64 year old male with shortness of breath. EXAM: PORTABLE CHEST 1 VIEW COMPARISON:  Chest radiograph dated 11/28/2017 FINDINGS: No focal consolidation, pleural effusion, pneumothorax. There is mild cardiomegaly. Atherosclerotic calcification of the aorta. No acute osseous pathology. IMPRESSION: No active disease. Electronically Signed   By: Elgie Collard M.D.   On: 09/27/2019 19:54   ECHOCARDIOGRAM COMPLETE  Result Date: 09/28/2019    ECHOCARDIOGRAM REPORT   Patient Name:   DAVIYON WIDMAYER Date of Exam: 09/28/2019 Medical Rec #:  [id number]  Height:       69.0 in Accession #:    [id number] Weight:       171.5 lb Date of Birth:  [birth year]  BSA:          1.935 m Patient Age:    63 years   BP:           152/93 mmHg Patient Gender: M          HR:           87 bpm. Exam Location:  Inpatient Procedure: 2D Echo Indications:    CHF 428.21  History:        Patient has no prior history of Echocardiogram examinations.                 Risk Factors:Hypertension and Current Smoker. Tobacco                 dependence. ETOH Dependence.  Sonographer:    Celene Skeen RDCS (AE) Referring Phys: [id number] JENNIFER YATES IMPRESSIONS  1. Left ventricular ejection fraction, by estimation, is 55 to 60%. The left ventricle has normal function. The left ventricle has no regional wall motion abnormalities.  There is mild concentric left ventricular hypertrophy. Left ventricular diastolic parameters are consistent with Grade I diastolic dysfunction (impaired relaxation).  2. Right ventricular systolic function is moderately reduced. The right ventricular size is moderately enlarged. There is moderately elevated pulmonary artery systolic pressure. The estimated right ventricular systolic pressure is 54.7 mmHg.  3. Right atrial size was moderately dilated.  4. The mitral valve is normal in structure. No evidence of mitral valve regurgitation.  5. The aortic valve is tricuspid. Aortic valve regurgitation is moderate. No aortic stenosis is present.  6. The inferior vena cava is dilated in size with <50% respiratory variability, suggesting right atrial pressure of 15 mmHg. FINDINGS  Left Ventricle: Left ventricular ejection fraction, by estimation, is 55 to 60%. The left ventricle has normal function. The left ventricle has no regional wall motion abnormalities. The left ventricular internal cavity size was normal in size. There is  mild concentric left ventricular hypertrophy. Left ventricular diastolic parameters are consistent with Grade I diastolic dysfunction (impaired relaxation). Indeterminate filling pressures. Right Ventricle: The right ventricular size is moderately enlarged. No increase in right ventricular wall thickness. Right ventricular systolic function is moderately reduced. There is moderately elevated pulmonary artery systolic pressure. The tricuspid  regurgitant velocity  is 3.15 m/s, and with an assumed right atrial pressure of 15 mmHg, the estimated right ventricular systolic pressure is 54.7 mmHg. Left Atrium: Left atrial size was normal in size. Right Atrium: Right atrial size was moderately dilated. Pericardium: There is no evidence of pericardial effusion. Mitral Valve: The mitral valve is normal in structure. No evidence of mitral valve regurgitation. Tricuspid Valve: The tricuspid valve is normal  in structure. Tricuspid valve regurgitation is mild. Aortic Valve: The aortic valve is tricuspid. Aortic valve regurgitation is moderate. Aortic regurgitation PHT measures 403 msec. No aortic stenosis is present. Pulmonic Valve: The pulmonic valve was not well visualized. Pulmonic valve regurgitation is not visualized. Aorta: The aortic root is normal in size and structure. Venous: The inferior vena cava is dilated in size with less than 50% respiratory variability, suggesting right atrial pressure of 15 mmHg. IAS/Shunts: No atrial level shunt detected by color flow Doppler.  LEFT VENTRICLE PLAX 2D LVIDd:         4.30 cm  Diastology LVIDs:         2.90 cm  LV e' medial:   6.85 cm/s LV PW:         1.20 cm  LV E/e' medial: 11.5 LV IVS:        1.40 cm LVOT diam:     2.00 cm LV SV:         67 LV SV Index:   35 LVOT Area:     3.14 cm  RIGHT VENTRICLE RV S prime:     8.81 cm/s TAPSE (M-mode): 1.8 cm LEFT ATRIUM           Index       RIGHT ATRIUM           Index LA diam:      3.10 cm 1.60 cm/m  RA Area:     25.50 cm LA Vol (A4C): 48.0 ml 24.80 ml/m RA Volume:   82.50 ml  42.63 ml/m  AORTIC VALVE LVOT Vmax:   114.00 cm/s LVOT Vmean:  65.050 cm/s LVOT VTI:    0.214 m AI PHT:      403 msec  AORTA Ao Root diam: 3.40 cm MITRAL VALVE               TRICUSPID VALVE MV Area (PHT): 3.60 cm    TR Peak grad:   39.7 mmHg MV Decel Time: 211 msec    TR Vmax:        315.00 cm/s MV E velocity: 78.80 cm/s MV A velocity: 94.30 cm/s  SHUNTS MV E/A ratio:  0.84        Systemic VTI:  0.21 m                            Systemic Diam: 2.00 cm Rachelle Hora Croitoru MD Electronically signed by Thurmon Fair MD Signature Date/Time: 09/28/2019/4:37:35 PM    Final    {  Assessment and Plan:   New onset heart failure Presented with a couple days of worsening sob and LLE. BNP elevated to 604. HS troponin mildly elevated. Albumin 3.4. CXR clear. CTA chest negative for PE.  - Echo showed EF 55-60%, no WMA, G1DD, moderately reduced RV function, mild  LVH, mod AI - started on IV lasix 40 mg BID - patient has put out 5L since admission, 1.6L overnight - weight down 7 lbs - creatinine improving 1.64>1.36 - start on Lopressor 12.5 mg BID - continue  with diuresis  Atrial tachycardia/SVT - on admission noted to be in SVT broke with adenosine and subsequent rhythm thought to be atrial tachycardia.  - started on IV dilt - Follow up EKG shows NSR, 95 bpm, RAD - Telemetry with some elevated rates up to 150s, appears to be regular with some p wave activity. Will discuss with MD - Echo pending as above - Heart rate well controlled - TSH 0.914 - K+ 4.0, Mag 0.8>>supplement  Elevated Troponin - HS troponin mildly elevated at 70>65 - Patient has RF for CAD including HTN, ETOH, tobacco use, family history - Will likely need ischemic eval eventually. Consider cardiac CT - started on Aspirin  HTN - not on meds at baseline - Hydralazine prn added - will add low dose BB. Will likely need titration and/or addition of meds  HLD - started on lipitor - LDL 71  ETOH dependence - chronic>>plans to stop - CIWA - per IM  Tobacco dependence - patient plans to stop - wheezing on exam  Dr. Mayford Knife to see later  For questions or updates, please contact [facility] Please consult [email or web address] for contact info under     Signed, Zakaria Fromer David Stall, PA-C  09/28/2019 5:07 PM

## 2019-09-28 NOTE — Evaluation (Signed)
Physical Therapy Evaluation Patient Details Name: Aaron Fuller MRN: [id number] DOB: 1956/01/28 Today's Date: 09/28/2019   History of Present Illness  Pt is a 64 y/o male admitted secondary to worsening SOB and BLE swelling. Thought to be secondary to new onset CHF. PMH includes alcohol use, tobacco use, and HTN.   Clinical Impression  Pt admitted secondary to problem above with deficits below. Pt requiring min guard to supervision for mobility tasks. Only able to tolerate short distance ambulation secondary to shortness of breath and decreased oxygen sats. Oxygen sats decreased to 87% on 2L and required 2L of O2, prolonged seated rest, and pursed lip breathing to return to 90%. Feel pt will progress well once symptoms improve. Will continue to follow acutely to maximize functional mobility independence and safety.     Follow Up Recommendations Supervision for mobility/OOB(HHPT vs No PT follow up pending progression )    Equipment Recommendations  None recommended by PT    Recommendations for Other Services       Precautions / Restrictions Precautions Precautions: Fall;Other (comment) Precaution Comments: watch O2 sats  Restrictions Weight Bearing Restrictions: No      Mobility  Bed Mobility Overal bed mobility: Needs Assistance Bed Mobility: Supine to Sit;Sit to Supine     Supine to sit: Supervision Sit to supine: Supervision   General bed mobility comments: Supervision for safety and line management.   Transfers Overall transfer level: Needs assistance Equipment used: None Transfers: Sit to/from Stand Sit to Stand: Min assist         General transfer comment: Min A for steadying assist to stand.   Ambulation/Gait Ambulation/Gait assistance: Min guard Gait Distance (Feet): 10 Feet Assistive device: None Gait Pattern/deviations: Step-through pattern;Decreased stride length Gait velocity: Decreased   General Gait Details: Only able to tolerate short distance gait  secondary to increased shortness of breath and oxygen sats decreased to 87% on 2L. Required prolonged seated rest and pursed lip breathing to return to 90% on 2L.   Stairs            Wheelchair Mobility    Modified Rankin (Stroke Patients Only)       Balance Overall balance assessment: Needs assistance Sitting-balance support: No upper extremity supported;Feet supported Sitting balance-Leahy Scale: Good     Standing balance support: No upper extremity supported;During functional activity Standing balance-Leahy Scale: Fair                               Pertinent Vitals/Pain Pain Assessment: No/denies pain    Home Living Family/patient expects to be discharged to:: Private residence Living Arrangements: Spouse/significant other Available Help at Discharge: Family;Available 24 hours/day Type of Home: House Home Access: Stairs to enter Entrance Stairs-Rails: Right;Left(both on the front; on the R on the back ) Secretary/administrator of Steps: 2 Home Layout: One level Home Equipment: None      Prior Function Level of Independence: Independent               Hand Dominance        Extremity/Trunk Assessment   Upper Extremity Assessment Upper Extremity Assessment: Overall WFL for tasks assessed    Lower Extremity Assessment Lower Extremity Assessment: Generalized weakness    Cervical / Trunk Assessment Cervical / Trunk Assessment: Normal  Communication   Communication: No difficulties  Cognition Arousal/Alertness: Awake/alert Behavior During Therapy: WFL for tasks assessed/performed Overall Cognitive Status: Within Functional Limits for tasks assessed  General Comments      Exercises     Assessment/Plan    PT Assessment Patient needs continued PT services  PT Problem List Decreased strength;Decreased mobility;Decreased activity tolerance;Cardiopulmonary status limiting  activity       PT Treatment Interventions Gait training;Stair training;Functional mobility training;Therapeutic activities;Therapeutic exercise;Balance training;Patient/family education    PT Goals (Current goals can be found in the Care Plan section)  Acute Rehab PT Goals Patient Stated Goal: to feel better PT Goal Formulation: With patient Time For Goal Achievement: 10/12/19 Potential to Achieve Goals: Good    Frequency Min 3X/week   Barriers to discharge        Co-evaluation               AM-PAC PT "6 Clicks" Mobility  Outcome Measure Help needed turning from your back to your side while in a flat bed without using bedrails?: None Help needed moving from lying on your back to sitting on the side of a flat bed without using bedrails?: None Help needed moving to and from a bed to a chair (including a wheelchair)?: A Little Help needed standing up from a chair using your arms (e.g., wheelchair or bedside chair)?: A Little Help needed to walk in hospital room?: A Little Help needed climbing 3-5 steps with a railing? : A Lot 6 Click Score: 19    End of Session Equipment Utilized During Treatment: Gait belt;Oxygen Activity Tolerance: Treatment limited secondary to medical complications (Comment)(shortness of breath and decreased oxygen sats) Patient left: in bed;with call bell/phone within reach;with bed alarm set;with family/visitor present;with nursing/sitter in room Nurse Communication: Mobility status PT Visit Diagnosis: Difficulty in walking, not elsewhere classified (R26.2)    Time: 7096-2836 PT Time Calculation (min) (ACUTE ONLY): 20 min   Charges:   PT Evaluation $PT Eval Moderate Complexity: 1 Mod          Reuel Derby, PT, DPT  Acute Rehabilitation Services  Pager: [phone] Office: [phone]   Rudean Hitt 09/28/2019, 4:56 PM

## 2019-09-28 NOTE — Progress Notes (Signed)
Chaplain engaged in initial visit with Aaron Fuller and his girlfriend.  Chaplain provided education around Scientist, water quality.  Chaplain answered all questions asked about the healthcare power of attorney. Ka's girlfriend stated they would try and get the paperwork notarized outside of the hospital.    Chaplain will follow-up as needed.

## 2019-09-28 NOTE — Progress Notes (Signed)
  Echocardiogram 2D Echocardiogram has been performed.  Celene Skeen 09/28/2019, 2:57 PM

## 2019-09-28 NOTE — H&P (Signed)
History and Physical    Aaron Fuller GLO:[id number] DOB: Oct 14, 1955 DOA: 09/27/2019  PCP: Patient, No Pcp Per - has initial appt on 4/23; last saw a PCP maybe 7-8 months ago Consultants:  None Patient coming from:  Home - lives with wife ("my old lady, she's a nurse"); NOK: Aaron Fuller, "my common law wife", [phone]  Chief Complaint: SOB, LE edema  HPI: Aaron Fuller is a 64 y.o. male with medical history significant of HTN; ETOH dependence presenting to [facility] with SOB and B LE edema.  He reports that his legs starting getting heavy, "they have been swelling and going down."  He bends on his knees at the end of the bed to watch TV but this time they were much bigger.  +SOB.  No cough.  +orthopnea.  +PND.  He is feeling better now - he thinks this is due to improved breathing.  His chest has been feeling tight - comes and goes.  Nothing makes it better or worse. "history told me" that he had CHF when he was younger, her sister told him that he has a bad heart.  He knows that he is making poor life choices and that he needs to stop drinking and smoking.  He has a 2yo grandson and wants to live to see him grow up and so he is motivated to change his behaviors.   ED Course:  [facility] to [facility] transfer, per Dr. Loney Loh:   Patient presenting with complaints of shortness of breath and bilateral leg swelling for the past few days.  Symptoms getting progressively worse.  Also has a history of alcoholism.  He was tachycardic on arrival with heart rate in the 170s and what appeared to be SVT.  Also hypertensive.  He was given adenosine 6 mg.  Subsequent rhythm was reviewed with cardiology and felt to be possible Aflutter.  Cardizem infusion started.  He has now converted back to sinus rhythm with heart rate in the 90s.  Has +3 edema on exam and hypoxic with oxygen saturation 88% on room air.  BNP elevated at 604 but chest x-ray clear. CTA negative for PE. Showing moderate emphysematous changes in the lungs. No pulm  edema. Patient will be given Lasix given significant peripheral edema. Will need echo to assess for CM given hx of alcoholism.   Review of Systems: As per HPI; otherwise review of systems reviewed and negative.   Ambulatory Status:  Ambulates without assistance  COVID Vaccine Status:  None  Past Medical History:  Diagnosis Date  . Alcohol dependence (HCC)   . Hypertension   . Tobacco dependence     Past Surgical History:  Procedure Laterality Date  . ulcer surgery      Social History   Socioeconomic History  . Marital status: Single    Spouse name: Not on file  . Number of children: Not on file  . Years of education: Not on file  . Highest education level: Not on file  Occupational History  . Occupation: retired Investment banker, operational  Tobacco Use  . Smoking status: Current Every Day Smoker    Packs/day: 1.00    Years: 49.00    Pack years: 49.00  . Smokeless tobacco: Never Used  . Tobacco comment: currently 0.5  Substance and Sexual Activity  . Alcohol use: Yes    Comment: drinks 1 pint a day, last drink 2-3 days ago  . Drug use: Never  . Sexual activity: Not on file  Other Topics Concern  . Not  on file  Social History Narrative  . Not on file   Social Determinants of Health   Financial Resource Strain:   . Difficulty of Paying Living Expenses:   Food Insecurity:   . Worried About Programme researcher, broadcasting/film/video in the Last Year:   . Barista in the Last Year:   Transportation Needs:   . Freight forwarder (Medical):   [name] Kitchen Lack of Transportation (Non-Medical):   Physical Activity:   . Days of Exercise per Week:   . Minutes of Exercise per Session:   Stress:   . Feeling of Stress :   Social Connections:   . Frequency of Communication with Friends and Family:   . Frequency of Social Gatherings with Friends and Family:   . Attends Religious Services:   . Active Member of Clubs or Organizations:   . Attends Banker Meetings:   [name] Kitchen Marital Status:   Intimate  Partner Violence:   . Fear of Current or Ex-Partner:   . Emotionally Abused:   [name] Kitchen Physically Abused:   . Sexually Abused:     No Known Allergies  Family History  Problem Relation Age of Onset  . CAD Mother   . Diabetes Mother   . Heart failure Mother   . CAD Father   . Diabetes Sister     Prior to Admission medications   Not on File    Physical Exam: Vitals:   09/28/19 0218 09/28/19 0423 09/28/19 0727 09/28/19 1052  BP:  (!) 160/77 (!) 148/83 (!) 152/93  Pulse:  89 88 87  Resp: (!) 23 20 20 19   Temp:  98.1 F (36.7 C) 98.6 F (37 C) 98.5 F (36.9 C)  TempSrc:  Oral Oral Oral  SpO2:  97% 97% 92%  Weight:      Height:         . General:  Appears calm and comfortable and is NAD . Eyes:   EOMI, normal lids, iris . ENT:  grossly normal hearing, lips & tongue, mmm; mostly edentulous . Neck:  no LAD, masses or thyromegaly . Cardiovascular:  RRR, no m/r/g. 3+ LE edema.  Respiratory:   CTA bilaterally with no wheezes/rales/rhonchi.  Normal respiratory effort. . Abdomen:  soft, NT, ND, NABS . Skin:  no rash or induration seen on limited exam . Musculoskeletal:  grossly normal tone BUE/BLE, good ROM, no bony abnormality . Psychiatric:  grossly normal mood and affect, speech fluent and appropriate, AOx3; emotionally labile when talking about his grandson . Neurologic:  CN 2-12 grossly intact, moves all extremities in coordinated fashion    Radiological Exams on Admission: CT Angio Chest PE W and/or Wo Contrast  Result Date: 09/27/2019 CLINICAL DATA:  Shortness of breath. Swelling of both legs and face. EXAM: CT ANGIOGRAPHY CHEST WITH CONTRAST TECHNIQUE: Multidetector CT imaging of the chest was performed using the standard protocol during bolus administration of intravenous contrast. Multiplanar CT image reconstructions and MIPs were obtained to evaluate the vascular anatomy. CONTRAST:  97mL OMNIPAQUE IOHEXOL 350 MG/ML SOLN COMPARISON:  None. FINDINGS: Cardiovascular:  Contrast injection is sufficient to demonstrate satisfactory opacification of the pulmonary arteries to the segmental level. There is no pulmonary embolus. The main pulmonary artery is within normal limits for size. There is no CT evidence of acute right heart strain. There are atherosclerotic changes of the thoracic aorta without evidence for dissection or aneurysm. Heart size is mildly enlarged. There is a trace pericardial effusion. Mediastinum/Nodes: --No mediastinal or  hilar lymphadenopathy. --No axillary lymphadenopathy. --No supraclavicular lymphadenopathy. --Normal thyroid gland. --The esophagus is unremarkable Lungs/Pleura: There are moderate emphysematous changes. There is no pneumothorax. No pleural effusion. Upper Abdomen: There is a 1.9 cm cyst in the left hepatic lobe. There is a moderate grade stenosis of the origin of the celiac axis with poststenotic dilatation of the mid to distal celiac axis which currently measures approximately 1.4 cm. Musculoskeletal: There is an old healed sternal body fracture. Review of the MIP images confirms the above findings. IMPRESSION: 1. No evidence for pulmonary embolus. 2. Moderate emphysematous changes in the lungs. 3. 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 4. Aortic Atherosclerosis (ICD10-I70.0) and Emphysema (ICD10-J43.9). Electronically Signed   By: Constance Holster M.D.   On: 09/27/2019 22:04   DG Chest Port 1 View  Result Date: 09/27/2019 CLINICAL DATA:  64 year old male with shortness of breath. EXAM: PORTABLE CHEST 1 VIEW COMPARISON:  Chest radiograph dated 11/28/2017 FINDINGS: No focal consolidation, pleural effusion, pneumothorax. There is mild cardiomegaly. Atherosclerotic calcification of the aorta. No acute osseous pathology. IMPRESSION: No active disease. Electronically Signed   By: Anner Crete M.D.   On: 09/27/2019  19:54    EKG: Independently reviewed.   1905 - SVT with rate of 175, QTc 507 2014 - NSR with rate 95; nonspecific ST changes with no evidence of acute ischemia   Labs on Admission: I have personally reviewed the available labs and imaging studies at the time of the admission.  Pertinent labs:   BUN 22/Creatinine 1.64/GFR 43 -> 14/1.36/>60 Mag++ 0.8 AST 67/ALT 76/Bili 3.3 BNP 604.8 HS troponin 70, 65 WBC 6.0 Platelets 130 D-dimer 1.17 INR 1.0 UA: trace Hgb, trace LE, 100 protein, many bacteria Lipids: 186/101/71/71 A1c 5.2   Assessment/Plan Principal Problem:   New onset of congestive heart failure (HCC) Active Problems:   Atrial tachycardia (HCC)   Alcohol dependence (HCC)   Hypertension   Tobacco dependence   New onset CHF -Patient presented to [facility] with SVT and/or atrial tachycardia -This was actually asymptomatic for him, but he had noticed SOB, orthopnea, PND, and marked LE edema -CXR/chest CT negative for pulmonary edema and also for PE -Mildly elevated BNP without known baseline -With elevated BNP and symptoms, acute decompensated CHF seems probable as diagnosis -Will admit, as per the Emergency HF Mortality Risk Grade.  The patient has: severe electrolyte abnormalities; cardiac arrhythmia -Will start ASA -No ACE at this time due to renal dysfunction -No beta blocker due to decompensation on presentation -CHF order set utilized -Cardiology consulted -Was given Lasix 40 mg x 1 in ER and will repeat with 40 mg IV BID -Continue [place] O2 for now if needed -Improving kidney function at this time, will follow -Mildly elevated HS troponin is likely related to demand ischemia; however, he does report intermittent midsternal CP which could be c/w angina and so ischemic evaluation may be in order but will defer to cardiology  SVT vs. Atrial tachycardia -Patient with extremely rapid HR while at [facility] -He was given adenosine and then started on Cardizem drip based on  cardiology input -Will order Diltiazem drip as per protocol with plan to transition to PO Diltiazem once heart rate is controlled -Will request Echocardiogram for further evaluation  -Will risk stratify with FLP and HgbA1c (normal); will also order TSH (normal) and UDS.  -Repeat EKG in AM  -Will consult cardiology  -  Heart rate is now well controlled. -Cardiology to weigh in on need for [facility] but does not appear to be indicated at this time  HTN -Reported possibly history but not taking medications -Will add prn hydralazine -He appears likely to need medication during hospitalization or at the time of d/c   HLD -Start Lipitor for LDL goal <70  ETOH dependence -Patient with chronic ETOH dependence -He is at increased risk for complications of withdrawal including seizures, DTs -CIWA protocol -TOC team consult for possible inpatient treatment -Elevated LFTs are likely related to alcoholism  Thrombocytopenia - Due to bone marrow suppression from alcohol abuse - Monitor daily platelet count   Tobacco dependence -Encourage cessation.   -This was discussed with the patient and should be reviewed on an ongoing basis.   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    DVT prophylaxis: Lovenox  Code Status:  Full - confirmed with patient Family Communication: None present; he reports that his "old lady" will be here soon Disposition Plan:  Home once clinically improved Consults called: Cardiology; PT; TOC team  Admission status: Admit - It is my clinical opinion that admission to INPATIENT is reasonable and necessary because this patient will require at least 2 midnights in the hospital to treat this condition based on the medical complexity of the problems presented.  Given the aforementioned  information, the predictability of an adverse outcome is felt to be significant.     Jonah Blue MD [facility]   How to contact the [facility] Attending or Consulting provider 7A - 7P or covering provider during after hours 7P -7A, for this patient?  1. Check the care team in [facility] and look for a) attending/consulting TRH provider listed and b) the [facility] team listed 2. Log into [email or web address] and use [place]'s universal password to access. If you do not have the password, please contact the hospital operator. 3. Locate the [facility] provider you are looking for under [facility] and page to a number that you can be directly reached. 4. If you still have difficulty reaching the provider, please page the [facility] (Director on Call) for the Hospitalists listed on amion for assistance.   09/28/2019, 1:24 PM

## 2019-09-29 ENCOUNTER — Inpatient Hospital Stay (HOSPITAL_COMMUNITY)
Admission: EM | Disposition: A | Discharge status: Home Health | Payer: Self-pay | Source: Home / Self Care | Attending: Internal Medicine

## 2019-09-29 DIAGNOSIS — F1029 Alcohol dependence with unspecified alcohol-induced disorder: Secondary | ICD-10-CM

## 2019-09-29 DIAGNOSIS — I509 Heart failure, unspecified: Secondary | ICD-10-CM

## 2019-09-29 DIAGNOSIS — J9601 Acute respiratory failure with hypoxia: Secondary | ICD-10-CM

## 2019-09-29 HISTORY — PX: RIGHT/LEFT HEART CATH AND CORONARY ANGIOGRAPHY: CATH118266

## 2019-09-29 LAB — POCT I-STAT EG7
Acid-Base Excess: 14 mmol/L — ABNORMAL HIGH (ref 0.0–2.0)
Acid-Base Excess: 14 mmol/L — ABNORMAL HIGH (ref 0.0–2.0)
Acid-Base Excess: 14 mmol/L — ABNORMAL HIGH (ref 0.0–2.0)
Acid-Base Excess: 14 mmol/L — ABNORMAL HIGH (ref 0.0–2.0)
Bicarbonate: 45.8 mmol/L — ABNORMAL HIGH (ref 20.0–28.0)
Bicarbonate: 45.9 mmol/L — ABNORMAL HIGH (ref 20.0–28.0)
Bicarbonate: 46 mmol/L — ABNORMAL HIGH (ref 20.0–28.0)
Bicarbonate: 46.3 mmol/L — ABNORMAL HIGH (ref 20.0–28.0)
Calcium, Ion: 1.18 mmol/L (ref 1.15–1.40)
Calcium, Ion: 1.19 mmol/L (ref 1.15–1.40)
Calcium, Ion: 1.19 mmol/L (ref 1.15–1.40)
Calcium, Ion: 1.2 mmol/L (ref 1.15–1.40)
HCT: 46 % (ref 39.0–52.0)
HCT: 46 % (ref 39.0–52.0)
HCT: 47 % (ref 39.0–52.0)
HCT: 47 % (ref 39.0–52.0)
Hemoglobin: 15.6 g/dL (ref 13.0–17.0)
Hemoglobin: 15.6 g/dL (ref 13.0–17.0)
Hemoglobin: 16 g/dL (ref 13.0–17.0)
Hemoglobin: 16 g/dL (ref 13.0–17.0)
O2 Saturation: 73 %
O2 Saturation: 73 %
O2 Saturation: 94 %
O2 Saturation: 95 %
Potassium: 3.8 mmol/L (ref 3.5–5.1)
Potassium: 3.9 mmol/L (ref 3.5–5.1)
Potassium: 3.9 mmol/L (ref 3.5–5.1)
Potassium: 4 mmol/L (ref 3.5–5.1)
Sodium: 139 mmol/L (ref 135–145)
Sodium: 140 mmol/L (ref 135–145)
Sodium: 140 mmol/L (ref 135–145)
Sodium: 140 mmol/L (ref 135–145)
TCO2: 49 mmol/L — ABNORMAL HIGH (ref 22–32)
TCO2: 49 mmol/L — ABNORMAL HIGH (ref 22–32)
TCO2: 49 mmol/L — ABNORMAL HIGH (ref 22–32)
TCO2: 49 mmol/L — ABNORMAL HIGH (ref 22–32)
pCO2, Ven: 93.5 mmHg (ref 44.0–60.0)
pCO2, Ven: 93.9 mmHg (ref 44.0–60.0)
pCO2, Ven: 94.9 mmHg (ref 44.0–60.0)
pCO2, Ven: 96.3 mmHg (ref 44.0–60.0)
pH, Ven: 7.29 (ref 7.250–7.430)
pH, Ven: 7.293 (ref 7.250–7.430)
pH, Ven: 7.297 (ref 7.250–7.430)
pH, Ven: 7.298 (ref 7.250–7.430)
pO2, Ven: 46 mmHg — ABNORMAL HIGH (ref 32.0–45.0)
pO2, Ven: 46 mmHg — ABNORMAL HIGH (ref 32.0–45.0)
pO2, Ven: 85 mmHg — ABNORMAL HIGH (ref 32.0–45.0)
pO2, Ven: 91 mmHg — ABNORMAL HIGH (ref 32.0–45.0)

## 2019-09-29 LAB — CBC WITH DIFFERENTIAL/PLATELET
Abs Immature Granulocytes: 0.02 10*3/uL (ref 0.00–0.07)
Basophils Absolute: 0 10*3/uL (ref 0.0–0.1)
Basophils Relative: 1 %
Eosinophils Absolute: 0.1 10*3/uL (ref 0.0–0.5)
Eosinophils Relative: 1 %
HCT: 40.7 % (ref 39.0–52.0)
Hemoglobin: 14 g/dL (ref 13.0–17.0)
Immature Granulocytes: 0 %
Lymphocytes Relative: 27 %
Lymphs Abs: 1.5 10*3/uL (ref 0.7–4.0)
MCH: 33.8 pg (ref 26.0–34.0)
MCHC: 34.4 g/dL (ref 30.0–36.0)
MCV: 98.3 fL (ref 80.0–100.0)
Monocytes Absolute: 0.9 10*3/uL (ref 0.1–1.0)
Monocytes Relative: 16 %
Neutro Abs: 3 10*3/uL (ref 1.7–7.7)
Neutrophils Relative %: 55 %
Platelets: 129 10*3/uL — ABNORMAL LOW (ref 150–400)
RBC: 4.14 MIL/uL — ABNORMAL LOW (ref 4.22–5.81)
RDW: 16.6 % — ABNORMAL HIGH (ref 11.5–15.5)
WBC: 5.4 10*3/uL (ref 4.0–10.5)
nRBC: 0.9 % — ABNORMAL HIGH (ref 0.0–0.2)

## 2019-09-29 LAB — BASIC METABOLIC PANEL
Anion gap: 10 (ref 5–15)
BUN: 17 mg/dL (ref 8–23)
CO2: 39 mmol/L — ABNORMAL HIGH (ref 22–32)
Calcium: 8.8 mg/dL — ABNORMAL LOW (ref 8.9–10.3)
Chloride: 93 mmol/L — ABNORMAL LOW (ref 98–111)
Creatinine, Ser: 1.39 mg/dL — ABNORMAL HIGH (ref 0.61–1.24)
GFR calc Af Amer: >60 mL/min (ref >60)
GFR calc non Af Amer: 53 mL/min — ABNORMAL LOW (ref >60)
Glucose, Bld: 150 mg/dL — ABNORMAL HIGH (ref 70–99)
Potassium: 3.7 mmol/L (ref 3.5–5.1)
Sodium: 142 mmol/L (ref 135–145)

## 2019-09-29 LAB — PROTIME-INR
INR: 1.1 (ref 0.8–1.2)
Prothrombin Time: 14.4 seconds (ref 11.4–15.2)

## 2019-09-29 LAB — HEPARIN LEVEL (UNFRACTIONATED): Heparin Unfractionated: 0.32 IU/mL (ref 0.30–0.70)

## 2019-09-29 LAB — MAGNESIUM: Magnesium: 1.3 mg/dL — ABNORMAL LOW (ref 1.7–2.4)

## 2019-09-29 SURGERY — RIGHT/LEFT HEART CATH AND CORONARY ANGIOGRAPHY
Anesthesia: LOCAL | Laterality: Bilateral

## 2019-09-29 MED ORDER — LIDOCAINE HCL (PF) 1 % IJ SOLN
INTRAMUSCULAR | Status: AC
  Filled 2019-09-29: qty 30

## 2019-09-29 MED ORDER — MIDAZOLAM HCL 2 MG/2ML IJ SOLN
INTRAMUSCULAR | Status: AC
  Filled 2019-09-29: qty 2

## 2019-09-29 MED ORDER — MAGNESIUM SULFATE 4 GM/100ML IV SOLN
4.0000 g | Freq: Once | INTRAVENOUS | Status: AC
  Administered 2019-09-29: 08:00:00 4 g via INTRAVENOUS
  Filled 2019-09-29: qty 100

## 2019-09-29 MED ORDER — ONDANSETRON HCL 4 MG/2ML IJ SOLN: 4.0000 mg | Freq: Four times a day (QID) | INTRAMUSCULAR | Status: DC | PRN

## 2019-09-29 MED ORDER — VERAPAMIL HCL 2.5 MG/ML IV SOLN
INTRAVENOUS | Status: DC | PRN
  Administered 2019-09-29: 10 mL via INTRA_ARTERIAL

## 2019-09-29 MED ORDER — IOHEXOL 350 MG/ML SOLN
INTRAVENOUS | Status: DC | PRN
  Administered 2019-09-29: 50 mL via INTRA_ARTERIAL

## 2019-09-29 MED ORDER — LIDOCAINE HCL (PF) 1 % IJ SOLN
INTRAMUSCULAR | Status: DC | PRN
  Administered 2019-09-29: 2 mL via INTRADERMAL

## 2019-09-29 MED ORDER — VERAPAMIL HCL 2.5 MG/ML IV SOLN
INTRAVENOUS | Status: AC
  Filled 2019-09-29: qty 2

## 2019-09-29 MED ORDER — SODIUM CHLORIDE 0.9% FLUSH: 3.0000 mL | INTRAVENOUS | Status: DC | PRN

## 2019-09-29 MED ORDER — LABETALOL HCL 5 MG/ML IV SOLN: 10.0000 mg | INTRAVENOUS | Status: AC | PRN

## 2019-09-29 MED ORDER — HEPARIN (PORCINE) IN NACL 1000-0.9 UT/500ML-% IV SOLN
INTRAVENOUS | Status: AC
  Filled 2019-09-29: qty 500

## 2019-09-29 MED ORDER — ACETAMINOPHEN 325 MG PO TABS: 650.0000 mg | ORAL_TABLET | ORAL | Status: DC | PRN

## 2019-09-29 MED ORDER — METOPROLOL TARTRATE 25 MG PO TABS
25.0000 mg | ORAL_TABLET | Freq: Two times a day (BID) | ORAL | Status: DC
  Administered 2019-09-29 – 2019-09-30 (×3): 25 mg via ORAL
  Filled 2019-09-29 (×3): qty 1

## 2019-09-29 MED ORDER — HYDRALAZINE HCL 20 MG/ML IJ SOLN: 10.0000 mg | INTRAMUSCULAR | Status: AC | PRN

## 2019-09-29 MED ORDER — METOPROLOL TARTRATE 12.5 MG HALF TABLET
12.5000 mg | ORAL_TABLET | Freq: Once | ORAL | Status: AC
  Administered 2019-09-29: 12.5 mg via ORAL
  Filled 2019-09-29: qty 1

## 2019-09-29 MED ORDER — FENTANYL CITRATE (PF) 100 MCG/2ML IJ SOLN
INTRAMUSCULAR | Status: DC | PRN
  Administered 2019-09-29: 25 ug via INTRAVENOUS

## 2019-09-29 MED ORDER — SODIUM CHLORIDE 0.9 % IV SOLN: 250.0000 mL | INTRAVENOUS | Status: DC | PRN

## 2019-09-29 MED ORDER — DIAZEPAM 5 MG PO TABS: 5.0000 mg | ORAL_TABLET | Freq: Four times a day (QID) | ORAL | Status: DC | PRN

## 2019-09-29 MED ORDER — SODIUM CHLORIDE 0.9% FLUSH
3.0000 mL | Freq: Two times a day (BID) | INTRAVENOUS | Status: DC
  Administered 2019-09-30 – 2019-10-03 (×5): 3 mL via INTRAVENOUS

## 2019-09-29 MED ORDER — HEPARIN SODIUM (PORCINE) 1000 UNIT/ML IJ SOLN
INTRAMUSCULAR | Status: AC
  Filled 2019-09-29: qty 1

## 2019-09-29 MED ORDER — FENTANYL CITRATE (PF) 100 MCG/2ML IJ SOLN
INTRAMUSCULAR | Status: AC
  Filled 2019-09-29: qty 2

## 2019-09-29 MED ORDER — MIDAZOLAM HCL 2 MG/2ML IJ SOLN
INTRAMUSCULAR | Status: DC | PRN
  Administered 2019-09-29: 2 mg via INTRAVENOUS

## 2019-09-29 MED ORDER — SODIUM CHLORIDE 0.9 % IV SOLN: INTRAVENOUS | Status: AC

## 2019-09-29 MED ORDER — HEPARIN (PORCINE) IN NACL 1000-0.9 UT/500ML-% IV SOLN
INTRAVENOUS | Status: DC | PRN
  Administered 2019-09-29 (×3): 500 mL

## 2019-09-29 SURGICAL SUPPLY — 13 items
CATH BALLN WEDGE 5F 110CM (CATHETERS) ×1 IMPLANT
CATH OPTITORQUE TIG 4.0 5F (CATHETERS) ×1 IMPLANT
DEVICE RAD COMP TR BAND LRG (VASCULAR PRODUCTS) ×1 IMPLANT
GLIDESHEATH SLEND SS 6F .021 (SHEATH) ×1 IMPLANT
GUIDEWIRE INQWIRE 1.5J.035X260 (WIRE) IMPLANT
INQWIRE 1.5J .035X260CM (WIRE) ×2
KIT HEART LEFT (KITS) ×2 IMPLANT
PACK CARDIAC CATHETERIZATION (CUSTOM PROCEDURE TRAY) ×2 IMPLANT
SHEATH GLIDE SLENDER 4/5FR (SHEATH) ×1 IMPLANT
SHEATH PROBE COVER 6X72 (BAG) ×1 IMPLANT
TRANSDUCER W/STOPCOCK (MISCELLANEOUS) ×2 IMPLANT
TUBING CIL FLEX 10 FLL-RA (TUBING) ×2 IMPLANT
WIRE EMERALD 3MM-J .025X260CM (WIRE) ×1 IMPLANT

## 2019-09-29 NOTE — Progress Notes (Signed)
PROGRESS NOTE   Aaron Fuller  GBT:[id number]    DOB: [birth year]    DOA: 09/27/2019  PCP: Patient, No Pcp Per   I have briefly reviewed patients previous medical records in [facility].  Chief Complaint:   Chief Complaint  Patient presents with  . Leg Swelling  . Tachycardia    Brief Narrative:  64 year old male, lives with family, independent, not on home oxygen, PMH of HTN, alcohol dependence, tobacco abuse, admitted to [facility] via [facility] with complaints of dyspnea, orthopnea, PND and bilateral lower extremity edema.  In the ED noted to be in SVT in the 170s, hypertensive, given adenosine 6 mg, rhythm reviewed by cardiology and felt to be possible atrial flutter, Cardizem infusion started, converted to sinus rhythm.  Also hypoxic at 88% on room air.  CTA chest negative for PE.  Admitted for new onset acute diastolic CHF, SVT/atrial flutter with RVR, elevated troponin, acute respiratory failure with hypoxia.  Diuresed with IV Lasix.  Improving.   Assessment & Plan:  Principal Problem:   New onset of congestive heart failure (HCC) Active Problems:   Atrial tachycardia (HCC)   Alcohol dependence (HCC)   Hypertension   Tobacco dependence   Tachycardia   Right ventricular dysfunction   Pulmonary hypertension, unspecified (HCC)   Acute diastolic heart failure (HCC)   Atrial flutter with rapid ventricular response ([place])   New onset acute diastolic CHF: BNP 710. HS troponin mildly elevated.  CTA chest negative for PE.  TTE showed LVEF 62-69%, grade 1 diastolic dysfunction and moderately reduced RV function.  Started on IV Lasix 40 mg twice daily.  -5.8 L thus far.  Creatinine progressively improving, 1.64 down to 1.39 likely cardiorenal etiology.  Etiology of right heart failure unclear but could be due to moderate pulmonary hypertension given COPD and smoking history.  Cardiology considering eventual right and left heart cath.  Addendum: Patient did have cardiac cath 4/7 which showed  findings consistent with moderate pulmonary hypertension with recommendations to optimize fluid status, blood pressure, cardiac rhythm inpatient with diastolic dysfunction who presented with SVT/atrial flutter.  Elevated troponin: Suspected due to demand ischemia in the setting of atrial flutter with RVR, acute CHF and hypoxia.  Eventual plan for right and left heart cath to define coronary anatomy and assess PAP and LV filling pressure.  SVT/atrial flutter with RVR: Presented with SVT which broke with adenosine and subsequent rhythm thought to be atrial flutter.  Started on IV diltiazem-now appears to be of and on metoprolol p.o.  Converted to sinus rhythm.  TSH 0.914.  Continue IV heparin drip with plans to change to DOAC after cath. CHADS2VASC score is 2  Acute respiratory failure with hypoxia: Due to decompensated CHF complicating underlying COPD.  Seems to have resolved.  Essential hypertension: Uncontrolled.  Adjusting medications.  Metoprolol increased to 25 mg twice daily.  Hyperlipidemia: LDL 71.  Continue atorvastatin.  Alcohol dependence: Abstinence counseled.  On CIWA protocol.  No overt withdrawal.  Tobacco abuse: Cessation counseled.  Continue nicotine patch.  COPD: Tobacco cessation counseled.  As needed bronchodilator nebulizations.  Thrombocytopenia: Likely related to alcohol abuse.  Stable.  Follow daily CBC.  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  Outpatient follow-up.  Body mass index is 23.41 kg/m.   Acute kidney injury: Patient presented with creatinine of 1.64 which is improved to 1.39.  Possibly due to cardiorenal syndrome.  Continue to monitor BMP daily.  No prior labs to know his baseline.  Hypomagnesemia: 0.8 > 1.3.  Replace aggressively and follow.  Abnormal LFTs: Likely combination of alcohol dependence and hepatic passive congestion from CHF.  Stable.  Follow LFTs.  Asymptomatic bacteriuria: Urine culture shows >100 K of gram-positive cocci, follow  final culture results.   DVT prophylaxis: IV heparin drip Code Status: Full Family Communication: None at bedside Disposition:  . Patient came from: Home           . Anticipated d/c place: Home . Barriers to d/c: Decompensated CHF with significant volume overload that requires ongoing IV Lasix and further evaluation including right and left heart cath by cardiology.   Consultants:   Cardiology  Procedures:   None  Antimicrobials:   None   Subjective:  Feels better.  Dyspnea improved but still has dyspnea on exertion.  Leg edema improved but not completely resolved.  States that he drank a pint of vodka daily and smoked half a pack of cigarettes daily prior to admission but plans to quit and not resume.  Objective:   Vitals:   09/29/19 1638 09/29/19 1716 09/29/19 1756 09/29/19 1800  BP: (!) 166/88 (!) 149/87 (!) 152/83 (!) 162/84  Pulse:  76  (!) 106  Resp: 15 20 20 16   Temp:      TempSrc:      SpO2:      Weight:      Height:        General exam: Middle-age male, moderately built and nourished lying propped up in bed, mildly tachypneic but not in overt distress. Respiratory system: Slightly diminished breath sounds bilaterally with few fine basal crackles and scattered couple of expiratory rhonchi posteriorly.  Mildly tachypneic when speaking.  No overt respiratory distress. Cardiovascular system: S1 & S2 heard, RRR. No JVD, murmurs, rubs, gallops or clicks.  1+ bilateral ankle edema.  Telemetry personally reviewed: Sinus rhythm. Gastrointestinal system: Abdomen is nondistended, soft and nontender. No organomegaly or masses felt. Normal bowel sounds heard. Central nervous system: Alert and oriented. No focal neurological deficits. Extremities: Symmetric 5 x 5 power. Skin: No rashes, lesions or ulcers Psychiatry: Judgement and insight appear normal. Mood & affect appropriate.     Data Reviewed:   I have personally reviewed following labs and imaging  studies   CBC: Recent Labs  Lab 09/27/19 1912 09/27/19 1912 09/28/19 1104 09/28/19 1104 09/29/19 0001 09/29/19 1559 09/29/19 1600 09/29/19 1625 09/29/19 1627  WBC 6.0  --  4.7  --  5.4  --   --   --   --   NEUTROABS 2.5  --   --   --  3.0  --   --   --   --   HGB 14.1   < > 13.6   < > 14.0   < > 16.0 15.6 15.6  HCT 40.4   < > 39.2   < > 40.7   < > 47.0 46.0 46.0  MCV 96.7  --  95.6  --  98.3  --   --   --   --   PLT 130*  --  125*  --  129*  --   --   --   --    < > = values in this interval not displayed.    Basic Metabolic Panel: Recent Labs  Lab 09/27/19 1912 09/27/19 1912 09/28/19 1104 09/28/19 1104 09/29/19 0001 09/29/19 1559 09/29/19 1600 09/29/19 1625 09/29/19 1627  NA 138   < > 142   < > 142   < > 140 140 139  K 4.2   < > 4.0   < > 3.7   < > 4.0 3.9 3.8  CL 97*  --  95*  --  93*  --   --   --   --   CO2 30  --  35*  --  39*  --   --   --   --   GLUCOSE 84  --  75  --  150*  --   --   --   --   BUN 22  --  14  --  17  --   --   --   --   CREATININE 1.64*  --  1.36*  --  1.39*  --   --   --   --   CALCIUM 8.7*  --  8.7*  --  8.8*  --   --   --   --   MG  --   --  0.8*  --  1.3*  --   --   --   --   PHOS  --   --  3.5  --   --   --   --   --   --    < > = values in this interval not displayed.    Liver Function Tests: Recent Labs  Lab 09/27/19 1912 09/28/19 1104  AST 71* 67*  ALT 84* 76*  ALKPHOS 71 70  BILITOT 1.5* 3.3*  PROT 7.6 7.1  ALBUMIN 3.7 3.4*    CBG: No results for input(s): GLUCAP in the last 168 hours.  Microbiology Studies:   Recent Results (from the past 240 hour(s))  SARS Coronavirus 2 Ag (30 min TAT) - Nasal Swab (BD Veritor Kit)     Status: None   Collection Time: 09/27/19  8:16 PM   Specimen: Nasal Swab (BD Veritor Kit)  Result Value Ref Range Status   SARS Coronavirus 2 Ag NEGATIVE NEGATIVE Final    Comment: (NOTE) SARS-CoV-2 antigen NOT DETECTED.  Negative results are presumptive.  Negative results do not  preclude SARS-CoV-2 infection and should not be used as the sole basis for treatment or other patient management decisions, including infection  control decisions, particularly in the presence of clinical signs and  symptoms consistent with COVID-19, or in those who have been in contact with the virus.  Negative results must be combined with clinical observations, patient history, and epidemiological information. The expected result is Negative. Fact Sheet for Patients: PodPark.tn Fact Sheet for Healthcare Providers: GiftContent.is This test is not yet approved or cleared by the [place] FDA and  has been authorized for detection and/or diagnosis of SARS-CoV-2 by FDA under an Emergency Use Authorization (EUA).  This EUA will remain in effect (meaning this test can be used) for the duration of  the COVID-19 de claration under Section 564(b)(1) of the Act, 21 U.S.C. section 360bbb-3(b)(1), unless the authorization is terminated or revoked sooner. Performed at [facility], [address] [place], [place] [address]   SARS CORONAVIRUS 2 (TAT 6-24 HRS) Nasopharyngeal Nasopharyngeal Swab     Status: None   Collection Time: 09/27/19  8:17 PM   Specimen: Nasopharyngeal Swab  Result Value Ref Range Status   SARS Coronavirus 2 NEGATIVE NEGATIVE Final    Comment: (NOTE) SARS-CoV-2 target nucleic acids are NOT DETECTED. The SARS-CoV-2 RNA is generally detectable in upper and lower respiratory specimens during the acute phase of infection. Negative results do not preclude SARS-CoV-2 infection,  do not rule out co-infections with other pathogens, and should not be used as the sole basis for treatment or other patient management decisions. Negative results must be combined with clinical observations, patient history, and epidemiological information. The expected result is Negative. Fact Sheet for  Patients: SugarRoll.be Fact Sheet for Healthcare Providers: https://www.woods-mathews.com/ This test is not yet approved or cleared by the [place] FDA and  has been authorized for detection and/or diagnosis of SARS-CoV-2 by FDA under an Emergency Use Authorization (EUA). This EUA will remain  in effect (meaning this test can be used) for the duration of the COVID-19 declaration under Section 56 4(b)(1) of the Act, 21 U.S.C. section 360bbb-3(b)(1), unless the authorization is terminated or revoked sooner. Performed at [facility], [place] [address], [place], [place] [address]   Urine culture     Status: Abnormal (Preliminary result)   Collection Time: 09/27/19  9:59 PM   Specimen: Urine, Random  Result Value Ref Range Status   Specimen Description   Final    URINE, RANDOM Performed at [facility], [address] [place], [place] [address]    Special Requests   Final    NONE Performed at [facility], [address] [place], [place] [address]    Culture (A)  Final    >=100,000 COLONIES/mL GRAM POSITIVE COCCI IDENTIFICATION AND SUSCEPTIBILITIES TO FOLLOW Performed at [facility], [place] [address] [place], [place] [address]    Report Status PENDING  Incomplete     Radiology Studies:  CARDIAC CATHETERIZATION  Result Date: 09/29/2019  Hemodynamic findings consistent with moderate pulmonary hypertension.  Moderately elevated right heart pressures with PA pressure 63/28 and mean pressure 39 mmHg consistent with at least moderate pulmonary hypertension. PVR: 2.9 WU LVEDP 17 - 18 mmHg. Normal epicardial coronary arteries. RECOMMENDATION: Optimization of fluid status, blood pressure, and cardiac rhythm in this patient diastolic dysfunction who presented with SVT/atrial flutter.  Smoking cessation is essential.     Scheduled Meds:   . [START ON 09/30/2019] aspirin EC  81 mg Oral Daily  . atorvastatin  20  mg Oral q1800  . docusate sodium  100 mg Oral BID  . folic acid  1 mg Oral Daily  . furosemide  40 mg Intravenous Q12H  . magnesium oxide  400 mg Oral BID  . metoprolol tartrate  25 mg Oral BID  . multivitamin with minerals  1 tablet Oral Daily  . nicotine  14 mg Transdermal Daily  . sodium chloride flush  3 mL Intravenous Q12H  . sodium chloride flush  3 mL Intravenous Q12H  . sodium chloride flush  3 mL Intravenous Q12H  . thiamine  100 mg Oral Daily   Or  . thiamine  100 mg Intravenous Daily    Continuous Infusions:   . sodium chloride    . sodium chloride    . sodium chloride    . heparin 1,100 Units/hr (09/28/19 2323)     LOS: 2 days     Vernell Leep, MD, [place], [facility]. [facility]    To contact the attending provider between 7A-7P or the covering provider during after hours 7P-7A, please log into the web site [email or web address] and access using universal [place] password for that web site. If you do not have the password, please call the hospital operator.  09/29/2019, 6:38 PM

## 2019-09-29 NOTE — Interval H&P Note (Signed)
Cath Lab Visit (complete for each Cath Lab visit)  Clinical Evaluation Leading to the Procedure:   ACS: No.  Non-ACS:    Anginal Classification: CCS III  Anti-ischemic medical therapy: Minimal Therapy (1 class of medications)  Non-Invasive Test Results: No non-invasive testing performed  Prior CABG: No previous CABG      History and Physical Interval Note:  09/29/2019 3:05 PM  [name]  has presented today for surgery, with the diagnosis of chf.  The various methods of treatment have been discussed with the patient and family. After consideration of risks, benefits and other options for treatment, the patient has consented to  Procedure(s): RIGHT/LEFT HEART CATH AND CORONARY ANGIOGRAPHY (Bilateral) as a surgical intervention.  The patient's history has been reviewed, patient examined, no change in status, stable for surgery.  I have reviewed the patient's chart and labs.  Questions were answered to the patient's satisfaction.     Nicki Guadal[place]

## 2019-09-29 NOTE — Progress Notes (Addendum)
Progress Note  Patient Name: Aaron Fuller Date of Encounter: 09/29/2019  Primary Cardiologist: Fransico Him, MD   Subjective   Denies any chest pain.  Breathing better this am  Inpatient Medications    Scheduled Meds: . [START ON 09/30/2019] aspirin EC  81 mg Oral Daily  . atorvastatin  20 mg Oral q1800  . docusate sodium  100 mg Oral BID  . folic acid  1 mg Oral Daily  . furosemide  40 mg Intravenous Q12H  . magnesium oxide  400 mg Oral BID  . metoprolol tartrate  12.5 mg Oral BID  . multivitamin with minerals  1 tablet Oral Daily  . nicotine  14 mg Transdermal Daily  . sodium chloride flush  3 mL Intravenous Q12H  . sodium chloride flush  3 mL Intravenous Q12H  . thiamine  100 mg Oral Daily   Or  . thiamine  100 mg Intravenous Daily   Continuous Infusions: . sodium chloride    . sodium chloride    . sodium chloride 10 mL/hr at 09/29/19 0606  . diltiazem (CARDIZEM) infusion 15 mg/hr (09/29/19 0601)  . heparin 1,100 Units/hr (09/28/19 2323)  . magnesium sulfate bolus IVPB 4 g (09/29/19 0822)   PRN Meds: sodium chloride, sodium chloride, acetaminophen **OR** acetaminophen, bisacodyl, hydrALAZINE, HYDROcodone-acetaminophen, LORazepam **OR** LORazepam, morphine injection, ondansetron **OR** ondansetron (ZOFRAN) IV, polyethylene glycol, sodium chloride flush, sodium chloride flush, zolpidem   Vital Signs    Vitals:   09/29/19 0037 09/29/19 0412 09/29/19 0416 09/29/19 0737  BP: (!) 142/89 (!) 144/85  (!) 154/94  Pulse: 81 82  81  Resp: 19 20  18   Temp: 98.4 F (36.9 C) 97.9 F (36.6 C)  98 F (36.7 C)  TempSrc: Oral Oral  Oral  SpO2: 97% 96%  90%  Weight:   71.9 kg   Height:        Intake/Output Summary (Last 24 hours) at 09/29/2019 0859 Last data filed at 09/29/2019 0606 Gross per 24 hour  Intake 774.5 ml  Output 2275 ml  Net -1500.5 ml   Filed Weights   09/27/19 1854 09/28/19 0052 09/29/19 0416  Weight: 81 kg 77.8 kg 71.9 kg    Telemetry    NSR -  Personally Reviewed  ECG    No new EKG to review - Personally Reviewed  Physical Exam   GEN: No acute distress.   Neck: No JVD Cardiac: RRR, no murmurs, rubs, or gallops.  Respiratory: wheezes at left base GI: Soft, nontender, non-distended  MS: No edema; No deformity. Neuro:  Nonfocal  Psych: Normal affect   Labs    Chemistry Recent Labs  Lab 09/27/19 1912 09/28/19 1104 09/29/19 0001  NA 138 142 142  K 4.2 4.0 3.7  CL 97* 95* 93*  CO2 30 35* 39*  GLUCOSE 84 75 150*  BUN 22 14 17   CREATININE 1.64* 1.36* 1.39*  CALCIUM 8.7* 8.7* 8.8*  PROT 7.6 7.1  --   ALBUMIN 3.7 3.4*  --   AST 71* 67*  --   ALT 84* 76*  --   ALKPHOS 71 70  --   BILITOT 1.5* 3.3*  --   GFRNONAA 43* 54* 53*  GFRAA 50* >60 >60  ANIONGAP 11 12 10      Hematology Recent Labs  Lab 09/27/19 1912 09/28/19 1104 09/29/19 0001  WBC 6.0 4.7 5.4  RBC 4.18* 4.10* 4.14*  HGB 14.1 13.6 14.0  HCT 40.4 39.2 40.7  MCV 96.7 95.6 98.3  MCH  33.7 33.2 33.8  MCHC 34.9 34.7 34.4  RDW 16.8* 16.5* 16.6*  PLT 130* 125* 129*    Cardiac EnzymesNo results for input(s): TROPONINI in the last 168 hours. No results for input(s): TROPIPOC in the last 168 hours.   BNP Recent Labs  Lab 09/27/19 1912  BNP 604.8*     DDimer  Recent Labs  Lab 09/27/19 2017  DDIMER 1.17*     Radiology    CT Angio Chest PE W and/or Wo Contrast  Result Date: 09/27/2019 CLINICAL DATA:  Shortness of breath. Swelling of both legs and face. EXAM: CT ANGIOGRAPHY CHEST WITH CONTRAST TECHNIQUE: Multidetector CT imaging of the chest was performed using the standard protocol during bolus administration of intravenous contrast. Multiplanar CT image reconstructions and MIPs were obtained to evaluate the vascular anatomy. CONTRAST:  80mL OMNIPAQUE IOHEXOL 350 MG/ML SOLN COMPARISON:  None. FINDINGS: Cardiovascular: Contrast injection is sufficient to demonstrate satisfactory opacification of the pulmonary arteries to the segmental level.  There is no pulmonary embolus. The main pulmonary artery is within normal limits for size. There is no CT evidence of acute right heart strain. There are atherosclerotic changes of the thoracic aorta without evidence for dissection or aneurysm. Heart size is mildly enlarged. There is a trace pericardial effusion. Mediastinum/Nodes: --No mediastinal or hilar lymphadenopathy. --No axillary lymphadenopathy. --No supraclavicular lymphadenopathy. --Normal thyroid gland. --The esophagus is unremarkable Lungs/Pleura: There are moderate emphysematous changes. There is no pneumothorax. No pleural effusion. Upper Abdomen: There is a 1.9 cm cyst in the left hepatic lobe. There is a moderate grade stenosis of the origin of the celiac axis with poststenotic dilatation of the mid to distal celiac axis which currently measures approximately 1.4 cm. Musculoskeletal: There is an old healed sternal body fracture. Review of the MIP images confirms the above findings. IMPRESSION: 1. No evidence for pulmonary embolus. 2. Moderate emphysematous changes in the lungs. 3. 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 4. Aortic Atherosclerosis (ICD10-I70.0) and Emphysema (ICD10-J43.9). Electronically Signed   By: Christopher  Green M.D.   On: 09/27/2019 22:04   DG Chest Port 1 View  Result Date: 09/27/2019 CLINICAL DATA:  63-year-old male with shortness of breath. EXAM: PORTABLE CHEST 1 VIEW COMPARISON:  Chest radiograph dated 11/28/2017 FINDINGS: No focal consolidation, pleural effusion, pneumothorax. There is mild cardiomegaly. Atherosclerotic calcification of the aorta. No acute osseous pathology. IMPRESSION: No active disease. Electronically Signed   By: Arash  Radparvar M.D.   On: 09/27/2019 19:54   ECHOCARDIOGRAM COMPLETE  Result Date: 09/28/2019    ECHOCARDIOGRAM REPORT   Patient Name:   Aaron Fuller Date of  Exam: 09/28/2019 Medical Rec #:  [id number]  Height:       69.0 in Accession #:    [id number] Weight:       171.5 lb Date of Birth:  05/26/1956  BSA:          1.935 m Patient Age:    63 years   BP:           152/93 mmHg Patient Gender: M          HR:           87 bpm. Exam Location:  Inpatient Procedure: 2D Echo Indications:    CHF 428.21  History:        Patient has no prior history of Echocardiogram examinations.                   Risk Factors:Hypertension and Current Smoker. Tobacco                 dependence. ETOH Dependence.  Sonographer:    Celene Skeen RDCS (AE) Referring Phys: [id number] JENNIFER YATES IMPRESSIONS  1. Left ventricular ejection fraction, by estimation, is 55 to 60%. The left ventricle has normal function. The left ventricle has no regional wall motion abnormalities. There is mild concentric left ventricular hypertrophy. Left ventricular diastolic parameters are consistent with Grade I diastolic dysfunction (impaired relaxation).  2. Right ventricular systolic function is moderately reduced. The right ventricular size is moderately enlarged. There is moderately elevated pulmonary artery systolic pressure. The estimated right ventricular systolic pressure is 54.7 mmHg.  3. Right atrial size was moderately dilated.  4. The mitral valve is normal in structure. No evidence of mitral valve regurgitation.  5. The aortic valve is tricuspid. Aortic valve regurgitation is moderate. No aortic stenosis is present.  6. The inferior vena cava is dilated in size with <50% respiratory variability, suggesting right atrial pressure of 15 mmHg. FINDINGS  Left Ventricle: Left ventricular ejection fraction, by estimation, is 55 to 60%. The left ventricle has normal function. The left ventricle has no regional wall motion abnormalities. The left ventricular internal cavity size was normal in size. There is  mild concentric left ventricular hypertrophy. Left ventricular diastolic parameters are consistent with Grade I  diastolic dysfunction (impaired relaxation). Indeterminate filling pressures. Right Ventricle: The right ventricular size is moderately enlarged. No increase in right ventricular wall thickness. Right ventricular systolic function is moderately reduced. There is moderately elevated pulmonary artery systolic pressure. The tricuspid  regurgitant velocity is 3.15 m/s, and with an assumed right atrial pressure of 15 mmHg, the estimated right ventricular systolic pressure is 54.7 mmHg. Left Atrium: Left atrial size was normal in size. Right Atrium: Right atrial size was moderately dilated. Pericardium: There is no evidence of pericardial effusion. Mitral Valve: The mitral valve is normal in structure. No evidence of mitral valve regurgitation. Tricuspid Valve: The tricuspid valve is normal in structure. Tricuspid valve regurgitation is mild. Aortic Valve: The aortic valve is tricuspid. Aortic valve regurgitation is moderate. Aortic regurgitation PHT measures 403 msec. No aortic stenosis is present. Pulmonic Valve: The pulmonic valve was not well visualized. Pulmonic valve regurgitation is not visualized. Aorta: The aortic root is normal in size and structure. Venous: The inferior vena cava is dilated in size with less than 50% respiratory variability, suggesting right atrial pressure of 15 mmHg. IAS/Shunts: No atrial level shunt detected by color flow Doppler.  LEFT VENTRICLE PLAX 2D LVIDd:         4.30 cm  Diastology LVIDs:         2.90 cm  LV e' medial:   6.85 cm/s LV PW:         1.20 cm  LV E/e' medial: 11.5 LV IVS:        1.40 cm LVOT diam:     2.00 cm LV SV:         67 LV SV Index:   35 LVOT Area:     3.14 cm  RIGHT VENTRICLE RV S prime:     8.81 cm/s TAPSE (M-mode): 1.8 cm LEFT ATRIUM           Index       RIGHT ATRIUM           Index LA diam:      3.10 cm 1.60 cm/m  RA Area:  25.50 cm LA Vol (A4C): 48.0 ml 24.80 ml/m RA Volume:   82.50 ml  42.63 ml/m  AORTIC VALVE LVOT Vmax:   114.00 cm/s LVOT Vmean:   65.050 cm/s LVOT VTI:    0.214 m AI PHT:      403 msec  AORTA Ao Root diam: 3.40 cm MITRAL VALVE               TRICUSPID VALVE MV Area (PHT): 3.60 cm    TR Peak grad:   39.7 mmHg MV Decel Time: 211 msec    TR Vmax:        315.00 cm/s MV E velocity: 78.80 cm/s MV A velocity: 94.30 cm/s  SHUNTS MV E/A ratio:  0.84        Systemic VTI:  0.21 m                            Systemic Diam: 2.00 cm Rachelle Hora Croitoru MD Electronically signed by Thurmon Fair MD Signature Date/Time: 09/28/2019/4:37:35 PM    Final     Cardiac Studies   2D echo IMPRESSIONS   1. Left ventricular ejection fraction, by estimation, is 55 to 60%. The  left ventricle has normal function. The left ventricle has no regional  wall motion abnormalities. There is mild concentric left ventricular  hypertrophy. Left ventricular diastolic  parameters are consistent with Grade I diastolic dysfunction (impaired  relaxation).  2. Right ventricular systolic function is moderately reduced. The right  ventricular size is moderately enlarged. There is moderately elevated  pulmonary artery systolic pressure. The estimated right ventricular  systolic pressure is 54.7 mmHg.  3. Right atrial size was moderately dilated.  4. The mitral valve is normal in structure. No evidence of mitral valve  regurgitation.  5. The aortic valve is tricuspid. Aortic valve regurgitation is moderate.  No aortic stenosis is present.  6. The inferior vena cava is dilated in size with <50% respiratory  variability, suggesting right atrial pressure of 15 mmHg.   Patient Profile     64 y.o. male  with a hx of HTN, tobacco and ETOH (1 pint Vodka daliy) abuse and fm hx of CAD with his father having an MI in his late 37's.  Admitted with 1 week hx of DOE and LE edema, Orthopnea and PND and found to be in acute CHF and atrial flutter with RVR.  Assessment & Plan    New onset heart failure Presented with a couple days of worsening sob and LLE. BNP elevated to 604.  HS troponin mildly elevated. Albumin 3.4. CXR clear. CTA chest negative for PE.  - Echo showed EF 55-60%, no WMA, G1DD, moderately reduced RV function, mild LVH, mod AI - started on IV lasix 40 mg BID - he put out 2.75L yesterday and is net neg 5.6L since admit - weight down 20 lbs since admit - creatinine stable 1.64>1.36>1.39 with diuresis - continue Lopressor 12.5 mg BID - continue with diuresis - follow I&O's, daily weights and renal function - ? Etiology of right heart failure - Chest CTA neg for acute PE.  RA also enlarged.   - He has moderate pulmonary HTN and with smoking hx, likely has COPD as at least part of the etiology of his elevated PAP and likely a component of pulmonary venous HTN from diastolic CHF.   - Would benefit from RHC to assess PAP and PVR.  - Hstrop mildly elevated and likely related to demand  ischemia in the setting of aflutter with RVR and CHF but he has CRF including hx of tobacco abuse for years and fm hx of CAD. - Plan for right and left heart cath to define coronary anatomy and assess PAP and LV filling pressure.   SVT/atrial flutter with RVR - on admission noted to be in SVT broke with adenosine and subsequent rhythm thought to be atrial flutter - started on IV dilt - maintaining NSR on tele - TSH 0.914 - K+ 4.0, Mag 0.8>>supplement - continue IV Heparin gtt for now and change to DOAC after cath -CHADS2VASC score is 2  Elevated Troponin - HS troponin mildly elevated at 70>65 - Patient has RF for CAD including HTN, ETOH, tobacco use, family history - likely related to CHF but given CRFs recommend proceeding as above with right and left heart cath - continue ASA  HTN - not on meds at baseline - BP remains elevated -increase Lopressor to 25mg  BID  HLD - started on lipitor - LDL 71  ETOH dependence - chronic>>plans to stop - CIWA - per IM  Tobacco dependence - patient plans to stop - wheezing on exam  I have spent a total of 35  minutes with patient reviewing hospital notes, 2D echo , telemetry, EKGs, labs and examining patient as well as establishing an assessment and plan that was discussed with the patient.  > 50% of time was spent in direct patient care.       For questions or updates, please contact [facility] Please consult [email or web address] for contact info under Cardiology/STEMI.      Signed, , MD  09/29/2019, 8:59 AM

## 2019-09-29 NOTE — Plan of Care (Signed)
  Problem: Education: Goal: Knowledge of General Education information will improve Description: Including pain rating scale, medication(s)/side effects and non-pharmacologic comfort measures Outcome: Progressing   Problem: Health Behavior/Discharge Planning: Goal: Ability to manage health-related needs will improve Outcome: Progressing   Problem: Clinical Measurements: Goal: Ability to maintain clinical measurements within normal limits will improve Outcome: Progressing   Problem: Safety: Goal: Ability to remain free from injury will improve Outcome: Progressing   Problem: Cardiac: Goal: Ability to achieve and maintain adequate cardiopulmonary perfusion will improve Outcome: Progressing   

## 2019-09-29 NOTE — Progress Notes (Signed)
ANTICOAGULATION CONSULT NOTE   Pharmacy Consult for Heparin Indication: atrial fibrillation  Allergies  Allergen Reactions  . Latex Rash    Patient Measurements: Height: 5\' 9"  (175.3 cm) Weight: 77.8 kg (171 lb 8 oz)(scale b) IBW/kg (Calculated) : 70.7 Heparin Dosing Weight: 77.8 kg  Vital Signs: Temp: 98.4 F (36.9 C) (04/07 0037) Temp Source: Oral (04/07 0037) BP: 142/89 (04/07 0037) Pulse Rate: 81 (04/07 0037)  Labs: Recent Labs    09/27/19 1912 09/27/19 1912 09/27/19 2017 09/27/19 2208 09/28/19 1104 09/29/19 0001  HGB 14.1   < >  --   --  13.6 14.0  HCT 40.4  --   --   --  39.2 40.7  PLT 130*  --   --   --  125* 129*  LABPROT  --   --  12.9  --  14.0 14.4  INR  --   --  1.0  --  1.1 1.1  HEPARINUNFRC  --   --   --   --   --  0.32  CREATININE 1.64*  --   --   --  1.36*  --   TROPONINIHS 70*  --   --  65*  --   --    < > = values in this interval not displayed.    Estimated Creatinine Clearance: 54.2 mL/min (A) (by C-G formula based on SCr of 1.36 mg/dL (H)).  Assessment: 64 year old male with SVT vs atrial tachycardia/flutter now converted to NSR. Pharmacy consulted for IV Heparin with plans to transition to ELiquis after cath per Cardiology since duration of flutter with RVR unknown. Not on anticoagulants prior to admission. H/H within normal limits. Platelets low on admission - 125 currently, per Dr. 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 INR 1.1 - baseline.  SCr elevated. Did receive Lovenox 40mg  4/6 2121 PM. Plan for Cath in AM.   Heparin level therapeutic (0.32) on gtt at 1100 units/hr.   Goal of Therapy:  Heparin level 0.3-0.7 units/ml Monitor platelets by anticoagulation protocol: Yes   Plan:  Continue Heparin at 1100 units/hr.  Daily Heparin level and CBC while on therapy Follow-up Cath tomorrow- timing not yet available.   6/6, PharmD, BCPS Please see amion for complete clinical  pharmacist phone list 09/29/2019,1:09 AM

## 2019-09-29 NOTE — H&P (View-Only) (Signed)
Progress Note  Patient Name: Aaron Fuller Date of Encounter: 09/29/2019  Primary Cardiologist: Fransico Him, MD   Subjective   Denies any chest pain.  Breathing better this am  Inpatient Medications    Scheduled Meds: . [START ON 09/30/2019] aspirin EC  81 mg Oral Daily  . atorvastatin  20 mg Oral q1800  . docusate sodium  100 mg Oral BID  . folic acid  1 mg Oral Daily  . furosemide  40 mg Intravenous Q12H  . magnesium oxide  400 mg Oral BID  . metoprolol tartrate  12.5 mg Oral BID  . multivitamin with minerals  1 tablet Oral Daily  . nicotine  14 mg Transdermal Daily  . sodium chloride flush  3 mL Intravenous Q12H  . sodium chloride flush  3 mL Intravenous Q12H  . thiamine  100 mg Oral Daily   Or  . thiamine  100 mg Intravenous Daily   Continuous Infusions: . sodium chloride    . sodium chloride    . sodium chloride 10 mL/hr at 09/29/19 0606  . diltiazem (CARDIZEM) infusion 15 mg/hr (09/29/19 0601)  . heparin 1,100 Units/hr (09/28/19 2323)  . magnesium sulfate bolus IVPB 4 g (09/29/19 0822)   PRN Meds: sodium chloride, sodium chloride, acetaminophen **OR** acetaminophen, bisacodyl, hydrALAZINE, HYDROcodone-acetaminophen, LORazepam **OR** LORazepam, morphine injection, ondansetron **OR** ondansetron (ZOFRAN) IV, polyethylene glycol, sodium chloride flush, sodium chloride flush, zolpidem   Vital Signs    Vitals:   09/29/19 0037 09/29/19 0412 09/29/19 0416 09/29/19 0737  BP: (!) 142/89 (!) 144/85  (!) 154/94  Pulse: 81 82  81  Resp: 19 20  18   Temp: 98.4 F (36.9 C) 97.9 F (36.6 C)  98 F (36.7 C)  TempSrc: Oral Oral  Oral  SpO2: 97% 96%  90%  Weight:   71.9 kg   Height:        Intake/Output Summary (Last 24 hours) at 09/29/2019 0859 Last data filed at 09/29/2019 0606 Gross per 24 hour  Intake 774.5 ml  Output 2275 ml  Net -1500.5 ml   Filed Weights   09/27/19 1854 09/28/19 0052 09/29/19 0416  Weight: 81 kg 77.8 kg 71.9 kg    Telemetry    NSR -  Personally Reviewed  ECG    No new EKG to review - Personally Reviewed  Physical Exam   GEN: No acute distress.   Neck: No JVD Cardiac: RRR, no murmurs, rubs, or gallops.  Respiratory: wheezes at left base GI: Soft, nontender, non-distended  MS: No edema; No deformity. Neuro:  Nonfocal  Psych: Normal affect   Labs    Chemistry Recent Labs  Lab 09/27/19 1912 09/28/19 1104 09/29/19 0001  NA 138 142 142  K 4.2 4.0 3.7  CL 97* 95* 93*  CO2 30 35* 39*  GLUCOSE 84 75 150*  BUN 22 14 17   CREATININE 1.64* 1.36* 1.39*  CALCIUM 8.7* 8.7* 8.8*  PROT 7.6 7.1  --   ALBUMIN 3.7 3.4*  --   AST 71* 67*  --   ALT 84* 76*  --   ALKPHOS 71 70  --   BILITOT 1.5* 3.3*  --   GFRNONAA 43* 54* 53*  GFRAA 50* >60 >60  ANIONGAP 11 12 10      Hematology Recent Labs  Lab 09/27/19 1912 09/28/19 1104 09/29/19 0001  WBC 6.0 4.7 5.4  RBC 4.18* 4.10* 4.14*  HGB 14.1 13.6 14.0  HCT 40.4 39.2 40.7  MCV 96.7 95.6 98.3  MCH  33.7 33.2 33.8  MCHC 34.9 34.7 34.4  RDW 16.8* 16.5* 16.6*  PLT 130* 125* 129*    Cardiac EnzymesNo results for input(s): TROPONINI in the last 168 hours. No results for input(s): TROPIPOC in the last 168 hours.   BNP Recent Labs  Lab 09/27/19 1912  BNP 604.8*     DDimer  Recent Labs  Lab 09/27/19 2017  DDIMER 1.17*     Radiology    CT Angio Chest PE W and/or Wo Contrast  Result Date: 09/27/2019 CLINICAL DATA:  Shortness of breath. Swelling of both legs and face. EXAM: CT ANGIOGRAPHY CHEST WITH CONTRAST TECHNIQUE: Multidetector CT imaging of the chest was performed using the standard protocol during bolus administration of intravenous contrast. Multiplanar CT image reconstructions and MIPs were obtained to evaluate the vascular anatomy. CONTRAST:  46mL OMNIPAQUE IOHEXOL 350 MG/ML SOLN COMPARISON:  None. FINDINGS: Cardiovascular: Contrast injection is sufficient to demonstrate satisfactory opacification of the pulmonary arteries to the segmental level.  There is no pulmonary embolus. The main pulmonary artery is within normal limits for size. There is no CT evidence of acute right heart strain. There are atherosclerotic changes of the thoracic aorta without evidence for dissection or aneurysm. Heart size is mildly enlarged. There is a trace pericardial effusion. Mediastinum/Nodes: --No mediastinal or hilar lymphadenopathy. --No axillary lymphadenopathy. --No supraclavicular lymphadenopathy. --Normal thyroid gland. --The esophagus is unremarkable Lungs/Pleura: There are moderate emphysematous changes. There is no pneumothorax. No pleural effusion. Upper Abdomen: There is a 1.9 cm cyst in the left hepatic lobe. There is a moderate grade stenosis of the origin of the celiac axis with poststenotic dilatation of the mid to distal celiac axis which currently measures approximately 1.4 cm. Musculoskeletal: There is an old healed sternal body fracture. Review of the MIP images confirms the above findings. IMPRESSION: 1. No evidence for pulmonary embolus. 2. Moderate emphysematous changes in the lungs. 3. 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 4. Aortic Atherosclerosis (ICD10-I70.0) and Emphysema (ICD10-J43.9). Electronically Signed   By: Katherine Mantle M.D.   On: 09/27/2019 22:04   DG Chest Port 1 View  Result Date: 09/27/2019 CLINICAL DATA:  64 year old male with shortness of breath. EXAM: PORTABLE CHEST 1 VIEW COMPARISON:  Chest radiograph dated 11/28/2017 FINDINGS: No focal consolidation, pleural effusion, pneumothorax. There is mild cardiomegaly. Atherosclerotic calcification of the aorta. No acute osseous pathology. IMPRESSION: No active disease. Electronically Signed   By: Elgie Collard M.D.   On: 09/27/2019 19:54   ECHOCARDIOGRAM COMPLETE  Result Date: 09/28/2019    ECHOCARDIOGRAM REPORT   Patient Name:   Aaron Fuller Date of  Exam: 09/28/2019 Medical Rec #:  [id number]  Height:       69.0 in Accession #:    [id number] Weight:       171.5 lb Date of Birth:  1956-04-18  BSA:          1.935 m Patient Age:    63 years   BP:           152/93 mmHg Patient Gender: M          HR:           87 bpm. Exam Location:  Inpatient Procedure: 2D Echo Indications:    CHF 428.21  History:        Patient has no prior history of Echocardiogram examinations.  Risk Factors:Hypertension and Current Smoker. Tobacco                 dependence. ETOH Dependence.  Sonographer:    Celene Skeen RDCS (AE) Referring Phys: [id number] JENNIFER YATES IMPRESSIONS  1. Left ventricular ejection fraction, by estimation, is 55 to 60%. The left ventricle has normal function. The left ventricle has no regional wall motion abnormalities. There is mild concentric left ventricular hypertrophy. Left ventricular diastolic parameters are consistent with Grade I diastolic dysfunction (impaired relaxation).  2. Right ventricular systolic function is moderately reduced. The right ventricular size is moderately enlarged. There is moderately elevated pulmonary artery systolic pressure. The estimated right ventricular systolic pressure is 54.7 mmHg.  3. Right atrial size was moderately dilated.  4. The mitral valve is normal in structure. No evidence of mitral valve regurgitation.  5. The aortic valve is tricuspid. Aortic valve regurgitation is moderate. No aortic stenosis is present.  6. The inferior vena cava is dilated in size with <50% respiratory variability, suggesting right atrial pressure of 15 mmHg. FINDINGS  Left Ventricle: Left ventricular ejection fraction, by estimation, is 55 to 60%. The left ventricle has normal function. The left ventricle has no regional wall motion abnormalities. The left ventricular internal cavity size was normal in size. There is  mild concentric left ventricular hypertrophy. Left ventricular diastolic parameters are consistent with Grade I  diastolic dysfunction (impaired relaxation). Indeterminate filling pressures. Right Ventricle: The right ventricular size is moderately enlarged. No increase in right ventricular wall thickness. Right ventricular systolic function is moderately reduced. There is moderately elevated pulmonary artery systolic pressure. The tricuspid  regurgitant velocity is 3.15 m/s, and with an assumed right atrial pressure of 15 mmHg, the estimated right ventricular systolic pressure is 54.7 mmHg. Left Atrium: Left atrial size was normal in size. Right Atrium: Right atrial size was moderately dilated. Pericardium: There is no evidence of pericardial effusion. Mitral Valve: The mitral valve is normal in structure. No evidence of mitral valve regurgitation. Tricuspid Valve: The tricuspid valve is normal in structure. Tricuspid valve regurgitation is mild. Aortic Valve: The aortic valve is tricuspid. Aortic valve regurgitation is moderate. Aortic regurgitation PHT measures 403 msec. No aortic stenosis is present. Pulmonic Valve: The pulmonic valve was not well visualized. Pulmonic valve regurgitation is not visualized. Aorta: The aortic root is normal in size and structure. Venous: The inferior vena cava is dilated in size with less than 50% respiratory variability, suggesting right atrial pressure of 15 mmHg. IAS/Shunts: No atrial level shunt detected by color flow Doppler.  LEFT VENTRICLE PLAX 2D LVIDd:         4.30 cm  Diastology LVIDs:         2.90 cm  LV e' medial:   6.85 cm/s LV PW:         1.20 cm  LV E/e' medial: 11.5 LV IVS:        1.40 cm LVOT diam:     2.00 cm LV SV:         67 LV SV Index:   35 LVOT Area:     3.14 cm  RIGHT VENTRICLE RV S prime:     8.81 cm/s TAPSE (M-mode): 1.8 cm LEFT ATRIUM           Index       RIGHT ATRIUM           Index LA diam:      3.10 cm 1.60 cm/m  RA Area:  25.50 cm LA Vol (A4C): 48.0 ml 24.80 ml/m RA Volume:   82.50 ml  42.63 ml/m  AORTIC VALVE LVOT Vmax:   114.00 cm/s LVOT Vmean:   65.050 cm/s LVOT VTI:    0.214 m AI PHT:      403 msec  AORTA Ao Root diam: 3.40 cm MITRAL VALVE               TRICUSPID VALVE MV Area (PHT): 3.60 cm    TR Peak grad:   39.7 mmHg MV Decel Time: 211 msec    TR Vmax:        315.00 cm/s MV E velocity: 78.80 cm/s MV A velocity: 94.30 cm/s  SHUNTS MV E/A ratio:  0.84        Systemic VTI:  0.21 m                            Systemic Diam: 2.00 cm Rachelle Hora Croitoru MD Electronically signed by Thurmon Fair MD Signature Date/Time: 09/28/2019/4:37:35 PM    Final     Cardiac Studies   2D echo IMPRESSIONS   1. Left ventricular ejection fraction, by estimation, is 55 to 60%. The  left ventricle has normal function. The left ventricle has no regional  wall motion abnormalities. There is mild concentric left ventricular  hypertrophy. Left ventricular diastolic  parameters are consistent with Grade I diastolic dysfunction (impaired  relaxation).  2. Right ventricular systolic function is moderately reduced. The right  ventricular size is moderately enlarged. There is moderately elevated  pulmonary artery systolic pressure. The estimated right ventricular  systolic pressure is 54.7 mmHg.  3. Right atrial size was moderately dilated.  4. The mitral valve is normal in structure. No evidence of mitral valve  regurgitation.  5. The aortic valve is tricuspid. Aortic valve regurgitation is moderate.  No aortic stenosis is present.  6. The inferior vena cava is dilated in size with <50% respiratory  variability, suggesting right atrial pressure of 15 mmHg.   Patient Profile     64 y.o. male  with a hx of HTN, tobacco and ETOH (1 pint Vodka daliy) abuse and fm hx of CAD with his father having an MI in his late 37's.  Admitted with 1 week hx of DOE and LE edema, Orthopnea and PND and found to be in acute CHF and atrial flutter with RVR.  Assessment & Plan    New onset heart failure Presented with a couple days of worsening sob and LLE. BNP elevated to 604.  HS troponin mildly elevated. Albumin 3.4. CXR clear. CTA chest negative for PE.  - Echo showed EF 55-60%, no WMA, G1DD, moderately reduced RV function, mild LVH, mod AI - started on IV lasix 40 mg BID - he put out 2.75L yesterday and is net neg 5.6L since admit - weight down 20 lbs since admit - creatinine stable 1.64>1.36>1.39 with diuresis - continue Lopressor 12.5 mg BID - continue with diuresis - follow I&O's, daily weights and renal function - ? Etiology of right heart failure - Chest CTA neg for acute PE.  RA also enlarged.   - He has moderate pulmonary HTN and with smoking hx, likely has COPD as at least part of the etiology of his elevated PAP and likely a component of pulmonary venous HTN from diastolic CHF.   - Would benefit from RHC to assess PAP and PVR.  - Hstrop mildly elevated and likely related to demand  ischemia in the setting of aflutter with RVR and CHF but he has CRF including hx of tobacco abuse for years and fm hx of CAD. - Plan for right and left heart cath to define coronary anatomy and assess PAP and LV filling pressure.   SVT/atrial flutter with RVR - on admission noted to be in SVT broke with adenosine and subsequent rhythm thought to be atrial flutter - started on IV dilt - maintaining NSR on tele - TSH 0.914 - K+ 4.0, Mag 0.8>>supplement - continue IV Heparin gtt for now and change to DOAC after cath -CHADS2VASC score is 2  Elevated Troponin - HS troponin mildly elevated at 70>65 - Patient has RF for CAD including HTN, ETOH, tobacco use, family history - likely related to CHF but given CRFs recommend proceeding as above with right and left heart cath - continue ASA  HTN - not on meds at baseline - BP remains elevated -increase Lopressor to 25mg  BID  HLD - started on lipitor - LDL 71  ETOH dependence - chronic>>plans to stop - CIWA - per IM  Tobacco dependence - patient plans to stop - wheezing on exam  I have spent a total of 35  minutes with patient reviewing hospital notes, 2D echo , telemetry, EKGs, labs and examining patient as well as establishing an assessment and plan that was discussed with the patient.  > 50% of time was spent in direct patient care.       For questions or updates, please contact [facility] Please consult [email or web address] for contact info under Cardiology/STEMI.      Signed, , MD  09/29/2019, 8:59 AM

## 2019-09-29 NOTE — Progress Notes (Signed)
Received pt from [facility], alert and oriented, wife at bedside. R brachial and R radial level 0

## 2019-09-30 ENCOUNTER — Inpatient Hospital Stay (HOSPITAL_COMMUNITY): Payer: Medicare Other

## 2019-09-30 DIAGNOSIS — N179 Acute kidney failure, unspecified: Secondary | ICD-10-CM

## 2019-09-30 LAB — BASIC METABOLIC PANEL
Anion gap: 10 (ref 5–15)
BUN: 18 mg/dL (ref 8–23)
CO2: 40 mmol/L — ABNORMAL HIGH (ref 22–32)
Calcium: 8.7 mg/dL — ABNORMAL LOW (ref 8.9–10.3)
Chloride: 90 mmol/L — ABNORMAL LOW (ref 98–111)
Creatinine, Ser: 1.95 mg/dL — ABNORMAL HIGH (ref 0.61–1.24)
GFR calc Af Amer: 41 mL/min — ABNORMAL LOW (ref >60)
GFR calc non Af Amer: 35 mL/min — ABNORMAL LOW (ref >60)
Glucose, Bld: 128 mg/dL — ABNORMAL HIGH (ref 70–99)
Potassium: 3.8 mmol/L (ref 3.5–5.1)
Sodium: 140 mmol/L (ref 135–145)

## 2019-09-30 LAB — MAGNESIUM: Magnesium: 2.1 mg/dL (ref 1.7–2.4)

## 2019-09-30 LAB — CBC
HCT: 39.4 % (ref 39.0–52.0)
Hemoglobin: 13.3 g/dL (ref 13.0–17.0)
MCH: 33.6 pg (ref 26.0–34.0)
MCHC: 33.8 g/dL (ref 30.0–36.0)
MCV: 99.5 fL (ref 80.0–100.0)
Platelets: 119 10*3/uL — ABNORMAL LOW (ref 150–400)
RBC: 3.96 MIL/uL — ABNORMAL LOW (ref 4.22–5.81)
RDW: 16.7 % — ABNORMAL HIGH (ref 11.5–15.5)
WBC: 5.3 10*3/uL (ref 4.0–10.5)
nRBC: 1.3 % — ABNORMAL HIGH (ref 0.0–0.2)

## 2019-09-30 LAB — HEPATIC FUNCTION PANEL
ALT: 63 U/L — ABNORMAL HIGH (ref 0–44)
AST: 69 U/L — ABNORMAL HIGH (ref 15–41)
Albumin: 3.1 g/dL — ABNORMAL LOW (ref 3.5–5.0)
Alkaline Phosphatase: 74 U/L (ref 38–126)
Bilirubin, Direct: 0.7 mg/dL — ABNORMAL HIGH (ref 0.0–0.2)
Indirect Bilirubin: 0.9 mg/dL (ref 0.3–0.9)
Total Bilirubin: 1.6 mg/dL — ABNORMAL HIGH (ref 0.3–1.2)
Total Protein: 6.5 g/dL (ref 6.5–8.1)

## 2019-09-30 LAB — URINE CULTURE: Culture: >=100000 — AB

## 2019-09-30 LAB — GLUCOSE, CAPILLARY: Glucose-Capillary: 82 mg/dL (ref 70–99)

## 2019-09-30 LAB — HEPARIN LEVEL (UNFRACTIONATED): Heparin Unfractionated: 0.18 IU/mL — ABNORMAL LOW (ref 0.30–0.70)

## 2019-09-30 MED ORDER — APIXABAN 5 MG PO TABS
5.0000 mg | ORAL_TABLET | Freq: Two times a day (BID) | ORAL | Status: DC
  Administered 2019-09-30 – 2019-10-03 (×7): 5 mg via ORAL
  Filled 2019-09-30 (×7): qty 1

## 2019-09-30 MED FILL — Heparin Sodium (Porcine) Inj 1000 Unit/ML: INTRAMUSCULAR | Qty: 10 | Status: AC

## 2019-09-30 NOTE — Progress Notes (Signed)
Physical Therapy Treatment Patient Details Name: Aaron Fuller MRN: [id number] DOB: [birth year] Today's Date: 09/30/2019    History of Present Illness Pt is a 64 y/o male admitted secondary to worsening SOB and BLE swelling. Thought to be secondary to new onset CHF. PMH includes alcohol use, tobacco use, and HTN.     PT Comments    Pt doing better with mobility this pm, was able to get oob with mod I, sat unsupported edge of bed with mod I and no LOBs, ambulated approx 241ft with RW and min guard assist, slow cadenced gait but functional. Was on room air and initially sats reading 86% (did not change with pursed lip breathing) after change in probe location sats 96% (no change in pt breathing noted/ no distress noted). Will continue to work with pt while he is hospital and progress independence as he tolerates, d/c recommendation has been amended. Pt is willing to have HHPT secondary to this being new onset of CHF.     Follow Up Recommendations  Home health PT     Equipment Recommendations  None recommended by PT    Recommendations for Other Services       Precautions / Restrictions Precautions Precautions: Fall;Other (comment) Precaution Comments: watch O2 sats  Restrictions Weight Bearing Restrictions: No    Mobility  Bed Mobility Overal bed mobility: Modified Independent             General bed mobility comments: able to get in/out bed w/ mod I  Transfers Overall transfer level: Needs assistance Equipment used: None Transfers: Stand Pivot Transfers;Sit to/from Stand Sit to Stand: Min guard;Supervision Stand pivot transfers: Min guard;Supervision       General transfer comment: able to transfer with stand by assist, feels more comfortable with walker sec to weakness experienced earlier  Ambulation/Gait Ambulation/Gait assistance: Min guard Gait Distance (Feet): 200 Feet Assistive device: Rolling walker (2 wheeled) Gait Pattern/deviations: Step-through  pattern Gait velocity: dec   General Gait Details: used walker for safety, did very well with ambulation, slow cadenced but functional   Stairs             Wheelchair Mobility    Modified Rankin (Stroke Patients Only)       Balance Overall balance assessment: Needs assistance Sitting-balance support: Feet supported Sitting balance-Leahy Scale: Good Sitting balance - Comments: able to sit edge of bed with no UE/ extrenal support without LObs   Standing balance support: During functional activity;Bilateral upper extremity supported Standing balance-Leahy Scale: Fair                              Cognition Arousal/Alertness: Awake/alert Behavior During Therapy: WFL for tasks assessed/performed Overall Cognitive Status: Within Functional Limits for tasks assessed                                        Exercises      General Comments General comments (skin integrity, edema, etc.): on room air and sats in 90s, needed change in probe site but able to get good pleth and sats 96%      Pertinent Vitals/Pain Pain Assessment: No/denies pain    Home Living                      Prior Function  PT Goals (current goals can now be found in the care plan section) Acute Rehab PT Goals Patient Stated Goal: return home to family PT Goal Formulation: With patient Time For Goal Achievement: 10/12/19 Potential to Achieve Goals: Good Progress towards PT goals: Progressing toward goals    Frequency    Min 3X/week      PT Plan Current plan remains appropriate    Co-evaluation              AM-PAC PT "6 Clicks" Mobility   Outcome Measure  Help needed turning from your back to your side while in a flat bed without using bedrails?: None Help needed moving from lying on your back to sitting on the side of a flat bed without using bedrails?: None Help needed moving to and from a bed to a chair (including a wheelchair)?:  A Little Help needed standing up from a chair using your arms (e.g., wheelchair or bedside chair)?: A Little Help needed to walk in hospital room?: A Little Help needed climbing 3-5 steps with a railing? : A Lot 6 Click Score: 19    End of Session Equipment Utilized During Treatment: Gait belt Activity Tolerance: Patient limited by fatigue;Patient tolerated treatment well Patient left: in bed;with call bell/phone within reach;with nursing/sitter in room   PT Visit Diagnosis: Difficulty in walking, not elsewhere classified (R26.2)     Time: 2585-2778 PT Time Calculation (min) (ACUTE ONLY): 23 min  Charges:  $Gait Training: 8-22 mins $Therapeutic Activity: 8-22 mins                     Drema Pry, PT    Aaron Fuller 09/30/2019, 2:00 PM

## 2019-09-30 NOTE — Progress Notes (Signed)
ANTICOAGULATION CONSULT NOTE   Pharmacy Consult for Heparin Indication: atrial fibrillation  Allergies  Allergen Reactions  . Latex Rash    Patient Measurements: Height: 5\' 9"  (175.3 cm) Weight: 71.9 kg (158 lb 8 oz) IBW/kg (Calculated) : 70.7 Heparin Dosing Weight: 77.8 kg  Vital Signs: Temp: 99.3 F (37.4 C) (04/08 0029) Temp Source: Oral (04/08 0029) BP: 99/74 (04/08 0029) Pulse Rate: 108 (04/08 0029)  Labs: Recent Labs    09/27/19 1912 09/27/19 1912 09/27/19 2017 09/27/19 2208 09/28/19 1104 09/28/19 1104 09/29/19 0001 09/29/19 1559 09/29/19 1600 09/29/19 1600 09/29/19 1625 09/29/19 1627 09/30/19 0459  HGB 14.1   < >  --   --  13.6   < > 14.0   < > 16.0   < > 15.6 15.6  --   HCT 40.4   < >  --   --  39.2   < > 40.7   < > 47.0  --  46.0 46.0  --   PLT 130*  --   --   --  125*  --  129*  --   --   --   --   --   --   LABPROT  --   --  12.9  --  14.0  --  14.4  --   --   --   --   --   --   INR  --   --  1.0  --  1.1  --  1.1  --   --   --   --   --   --   HEPARINUNFRC  --   --   --   --   --   --  0.32  --   --   --   --   --  0.18*  CREATININE 1.64*  --   --   --  1.36*  --  1.39*  --   --   --   --   --   --   TROPONINIHS 70*  --   --  65*  --   --   --   --   --   --   --   --   --    < > = values in this interval not displayed.    Estimated Creatinine Clearance: 53 mL/min (A) (by C-G formula based on SCr of 1.39 mg/dL (H)).  Assessment: 64 year old male with SVT vs atrial tachycardia/flutter now converted to NSR. Pharmacy consulted for IV Heparin with plans to transition to ELiquis after cath per Cardiology since duration of flutter with RVR unknown. Not on anticoagulants prior to admission. H/H within normal limits. Platelets low on admission - 125 currently, per Dr. 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 INR 1.1 - baseline.  SCr elevated. Did receive Lovenox 40mg  4/6 2121 PM. Plan for Cath in AM.    Heparin level down to subtherapeutic (0.18) on gtt at 1100 units/hr. Heparin was off post cath 4/7 then restarted about midnight. Level may be slightly low due to not quite 6hr post restart.  Goal of Therapy:  Heparin level 0.3-0.7 units/ml Monitor platelets by anticoagulation protocol: Yes   Plan:  Increase Heparin to 1200 units/hr Plan to DOAC today so will not order f/u level now  02-16-2005, PharmD, BCPS Please see amion for complete clinical pharmacist phone list 09/30/2019,5:56 AM

## 2019-09-30 NOTE — TOC Benefit Eligibility Note (Signed)
Transition of Care [facility]) Benefit Eligibility Note    Patient Details  Name: Aaron Fuller MRN: [id number] Date of Birth: 09/07/1955   Medication/Dose: Everlene Balls  2.5 MG BID  Covered?: Yes  Tier: 3 Drug  Prescription Coverage [facility]erred [facility]acy: [facility]   and   [facility]  Spoke with Person/Company/Phone Number:: JEN   @  [phone]  Co-Pay: ZERO DOLLARS     Deductible: (LOWE INCOME SUBSIDY LEVEL TWO)  Additional Notes: ELIQUIS 5 MG BID , COVER- YES, CO-PAY- ZERO DOLLARS, TIER- 3 DRUG, P/A-NO    Mardene Sayer Phone Number: 09/30/2019, 2:12 PM

## 2019-09-30 NOTE — Progress Notes (Signed)
PROGRESS NOTE   Cross Jorge  LZJ:[id number]    DOB: 1955/08/22    DOA: 09/27/2019  PCP: Patient, No Pcp Per   I have briefly reviewed patients previous medical records in [facility].  Chief Complaint:   Chief Complaint  Patient presents with  . Leg Swelling  . Tachycardia    Brief Narrative:  64 year old male, lives with family, independent, not on home oxygen, PMH of HTN, alcohol dependence, tobacco abuse, admitted to [facility] via [facility] with complaints of dyspnea, orthopnea, PND and bilateral lower extremity edema.  In the ED noted to be in SVT in the 170s, hypertensive, given adenosine 6 mg, rhythm reviewed by cardiology and felt to be possible atrial flutter, Cardizem infusion started, converted to sinus rhythm.  Also hypoxic at 88% on room air.  CTA chest negative for PE.  Admitted for new onset acute diastolic CHF, SVT/atrial flutter with RVR, elevated troponin, acute respiratory failure with hypoxia.  Diuresed with IV Lasix.  S/p cardiac cath 4/7.  Course complicated by AKI.   Assessment & Plan:  Principal Problem:   New onset of congestive heart failure (HCC) Active Problems:   Atrial tachycardia (HCC)   Alcohol dependence (HCC)   Hypertension   Tobacco dependence   Tachycardia   Right ventricular dysfunction   Pulmonary hypertension, unspecified (HCC)   Acute diastolic heart failure (HCC)   Atrial flutter with rapid ventricular response ([place])   New onset acute diastolic CHF: BNP 024. HS troponin mildly elevated.  CTA chest negative for PE.  TTE showed LVEF 09-73%, grade 1 diastolic dysfunction and moderately reduced RV function.  Started on IV Lasix 40 mg twice daily.  -5.4 L thus far, may not be fully accurate.  Creatinine which had progressively improved from 1.64-1.39 has now bumped up to 1.9 likely related to aggressive diuresis and cath.  IV Lasix discontinued.  Cardiac cath on 4/7 showed elevated filling pressures, pulmonary hypertension and normal coronary arteries.   May consider oral Lasix tomorrow pending improvement in creatinine at time of discharge  Pulmonary hypertension: RHC showed elevated LVEDP.  Cardiology checking PFTs.   Elevated troponin: Suspected due to demand ischemia in the setting of atrial flutter with RVR, acute CHF and hypoxia.  Cardiac cath 4/7 showed normal coronaries.  SVT/atrial flutter with RVR: Presented with SVT which broke with adenosine and subsequent rhythm thought to be atrial flutter.  Started on IV diltiazem-now discontinued and on metoprolol p.o.  Converted to sinus rhythm.  TSH 0.914.  IV heparin changed to Eliquis by cardiology. CHADS2VASC score is 2  Acute respiratory failure with hypoxia: Due to decompensated CHF complicating underlying COPD.  Seems to have resolved.  Essential hypertension: Controlled on metoprolol increased to 25 mg twice daily.  Hyperlipidemia: LDL 71.  Continue atorvastatin.  Alcohol dependence: Abstinence counseled.  On CIWA protocol.  No overt withdrawal.  Tobacco abuse: Cessation counseled.  Continue nicotine patch.  COPD: Tobacco cessation counseled.  As needed bronchodilator nebulizations.  Thrombocytopenia: Likely related to alcohol abuse.  Stable.  Follow daily CBC.  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  Outpatient follow-up.  Body mass index is 23.81 kg/m.   Acute kidney injury: Patient presented with creatinine of 1.64 which had improved to 1.39 but has gone up again to 1.94 today.  AKI likely related to aggressive diuresis and post cath.  IV Lasix held.  Follow BMP in a.m.  No prior labs to know his baseline.  Hypomagnesemia: 0.8 > 1.3.  Replace aggressively/2.1 today.  Abnormal LFTs: Likely combination of alcohol dependence and hepatic passive congestion from CHF.  Stable.  Improving.  Asymptomatic bacteriuria: Urine culture shows >100 K Aerococcus.   DVT prophylaxis: IV heparin drip Code Status: Full Family Communication: None at bedside Disposition:  . Patient  came from: Home           . Anticipated d/c place: Home . Barriers to d/c: Acute kidney injury complicated by recent aggressive IV diuresis and post cath.  Lasix discontinued.  Close monitoring of BMP in a.m. and if improved, possible discharge home 4/9.   Consultants:   Cardiology  Procedures:   None  Antimicrobials:   None   Subjective:  Continues to feel better.  No dyspnea or chest pain.  Occasional wheezing.  Leg swelling resolved.  Objective:   Vitals:   09/30/19 0605 09/30/19 0605 09/30/19 0607 09/30/19 0726  BP:  105/78  120/76  Pulse:  (!) 103 (!) 103 (!) 101  Resp:  20  20  Temp:  98.4 F (36.9 C)  98.6 F (37 C)  TempSrc:  Oral  Oral  SpO2:  (!) 59% (!) 68% 90%  Weight: 73.1 kg     Height:        General exam: Middle-age male, moderately built and nourished lying propped up in bed, mildly tachypneic but not in overt distress. Respiratory system: Clear to auscultation.  No increased work of breathing.  No crackles, wheezing or rhonchi appreciated. Cardiovascular system: S1 and S2 heard, RRR.  No JVD, murmurs or pedal/ankle edema.  Telemetry personally reviewed: SR-mild ST in the 100s. Gastrointestinal system: Abdomen is nondistended, soft and nontender. No organomegaly or masses felt. Normal bowel sounds heard. Central nervous system: Alert and oriented. No focal neurological deficits. Extremities: Symmetric 5 x 5 power. Skin: No rashes, lesions or ulcers Psychiatry: Judgement and insight appear normal. Mood & affect appropriate.     Data Reviewed:   I have personally reviewed following labs and imaging studies   CBC: Recent Labs  Lab 09/27/19 1912 09/27/19 1912 09/28/19 1104 09/28/19 1104 09/29/19 0001 09/29/19 1559 09/29/19 1625 09/29/19 1627 09/30/19 0459  WBC 6.0   < > 4.7  --  5.4  --   --   --  5.3  NEUTROABS 2.5  --   --   --  3.0  --   --   --   --   HGB 14.1   < > 13.6   < > 14.0   < > 15.6 15.6 13.3  HCT 40.4   < > 39.2   < > 40.7    < > 46.0 46.0 39.4  MCV 96.7   < > 95.6  --  98.3  --   --   --  99.5  PLT 130*   < > 125*  --  129*  --   --   --  119*   < > = values in this interval not displayed.    Basic Metabolic Panel: Recent Labs  Lab 09/28/19 1104 09/28/19 1104 09/29/19 0001 09/29/19 1559 09/29/19 1625 09/29/19 1627 09/30/19 0459  NA 142   < > 142   < > 140 139 140  K 4.0   < > 3.7   < > 3.9 3.8 3.8  CL 95*  --  93*  --   --   --  90*  CO2 35*  --  39*  --   --   --  40*  GLUCOSE 75  --  150*  --   --   --  128*  BUN 14  --  17  --   --   --  18  CREATININE 1.36*  --  1.39*  --   --   --  1.95*  CALCIUM 8.7*  --  8.8*  --   --   --  8.7*  MG 0.8*  --  1.3*  --   --   --  2.1  PHOS 3.5  --   --   --   --   --   --    < > = values in this interval not displayed.    Liver Function Tests: Recent Labs  Lab 09/27/19 1912 09/28/19 1104 09/30/19 0459  AST 71* 67* 69*  ALT 84* 76* 63*  ALKPHOS 71 70 74  BILITOT 1.5* 3.3* 1.6*  PROT 7.6 7.1 6.5  ALBUMIN 3.7 3.4* 3.1*    CBG: No results for input(s): GLUCAP in the last 168 hours.  Microbiology Studies:   Recent Results (from the past 240 hour(s))  SARS Coronavirus 2 Ag (30 min TAT) - Nasal Swab (BD Veritor Kit)     Status: None   Collection Time: 09/27/19  8:16 PM   Specimen: Nasal Swab (BD Veritor Kit)  Result Value Ref Range Status   SARS Coronavirus 2 Ag NEGATIVE NEGATIVE Final    Comment: (NOTE) SARS-CoV-2 antigen NOT DETECTED.  Negative results are presumptive.  Negative results do not preclude SARS-CoV-2 infection and should not be used as the sole basis for treatment or other patient management decisions, including infection  control decisions, particularly in the presence of clinical signs and  symptoms consistent with COVID-19, or in those who have been in contact with the virus.  Negative results must be combined with clinical observations, patient history, and epidemiological information. The expected result is Negative. Fact  Sheet for Patients: PodPark.tn Fact Sheet for Healthcare Providers: GiftContent.is This test is not yet approved or cleared by the [place] FDA and  has been authorized for detection and/or diagnosis of SARS-CoV-2 by FDA under an Emergency Use Authorization (EUA).  This EUA will remain in effect (meaning this test can be used) for the duration of  the COVID-19 de claration under Section 564(b)(1) of the Act, 21 U.S.C. section 360bbb-3(b)(1), unless the authorization is terminated or revoked sooner. Performed at [facility], [address] [place], [place] [address]   SARS CORONAVIRUS 2 (TAT 6-24 HRS) Nasopharyngeal Nasopharyngeal Swab     Status: None   Collection Time: 09/27/19  8:17 PM   Specimen: Nasopharyngeal Swab  Result Value Ref Range Status   SARS Coronavirus 2 NEGATIVE NEGATIVE Final    Comment: (NOTE) SARS-CoV-2 target nucleic acids are NOT DETECTED. The SARS-CoV-2 RNA is generally detectable in upper and lower respiratory specimens during the acute phase of infection. Negative results do not preclude SARS-CoV-2 infection, do not rule out co-infections with other pathogens, and should not be used as the sole basis for treatment or other patient management decisions. Negative results must be combined with clinical observations, patient history, and epidemiological information. The expected result is Negative. Fact Sheet for Patients: SugarRoll.be Fact Sheet for Healthcare Providers: https://www.woods-mathews.com/ This test is not yet approved or cleared by the [place] FDA and  has been authorized for detection and/or diagnosis of SARS-CoV-2 by FDA under an Emergency Use Authorization (EUA). This EUA will remain  in effect (meaning this test can be used) for the duration  of the COVID-19 declaration under Section 56 4(b)(1) of the Act, 21  U.S.C. section 360bbb-3(b)(1), unless the authorization is terminated or revoked sooner. Performed at [facility], [address], [place], [place] [address]   Urine culture     Status: Abnormal   Collection Time: 09/27/19  9:59 PM   Specimen: Urine, Random  Result Value Ref Range Status   Specimen Description   Final    URINE, RANDOM Performed at [facility], [address] [place], [place] [address]    Special Requests   Final    NONE Performed at [facility], [address] [place], [place] [address]    Culture (A)  Final    >=100,000 COLONIES/mL AEROCOCCUS SPECIES Standardized susceptibility testing for this organism is not available. Performed at [facility], [place] [address] [place], [place] [address]    Report Status 09/30/2019 FINAL  Final     Radiology Studies:  CARDIAC CATHETERIZATION  Result Date: 09/29/2019  Hemodynamic findings consistent with moderate pulmonary hypertension.  Moderately elevated right heart pressures with PA pressure 63/28 and mean pressure 39 mmHg consistent with at least moderate pulmonary hypertension. PVR: 2.9 WU LVEDP 17 - 18 mmHg. Normal epicardial coronary arteries. RECOMMENDATION: Optimization of fluid status, blood pressure, and cardiac rhythm in this patient diastolic dysfunction who presented with SVT/atrial flutter.  Smoking cessation is essential.     Scheduled Meds:   . apixaban  5 mg Oral BID  . atorvastatin  20 mg Oral q1800  . docusate sodium  100 mg Oral BID  . folic acid  1 mg Oral Daily  . magnesium oxide  400 mg Oral BID  . metoprolol tartrate  25 mg Oral BID  . multivitamin with minerals  1 tablet Oral Daily  . nicotine  14 mg Transdermal Daily  . sodium chloride flush  3 mL Intravenous Q12H  . sodium chloride flush  3 mL Intravenous Q12H  . sodium chloride flush  3 mL Intravenous Q12H  . thiamine  100 mg Oral Daily   Or  . thiamine  100 mg Intravenous Daily    Continuous  Infusions:   . sodium chloride    . sodium chloride       LOS: 3 days     Vernell Leep, MD, [place], [facility]. [facility]    To contact the attending provider between 7A-7P or the covering provider during after hours 7P-7A, please log into the web site [email or web address] and access using universal [place] password for that web site. If you do not have the password, please call the hospital operator.  09/30/2019, 12:02 PM

## 2019-09-30 NOTE — Progress Notes (Signed)
pts is a mews yellow MD notified  Will continue to monitor

## 2019-09-30 NOTE — Care Management Important Message (Signed)
Important Message  Patient Details  Name: Aaron Fuller MRN: [id number] Date of Birth: 07/26/1955   Medicare Important Message Given:  Yes     Renie Ora 09/30/2019, 10:53 AM

## 2019-09-30 NOTE — Progress Notes (Signed)
Progress Note  Patient Name: Aaron Fuller Date of Encounter: 09/30/2019  Primary Cardiologist: Fransico Him, MD   Subjective   Doing well post cath.  Cath showed elevated filling pressures, pulmonary HTN and normal coronary arteries.   Inpatient Medications    Scheduled Meds: . aspirin EC  81 mg Oral Daily  . atorvastatin  20 mg Oral q1800  . docusate sodium  100 mg Oral BID  . folic acid  1 mg Oral Daily  . magnesium oxide  400 mg Oral BID  . metoprolol tartrate  25 mg Oral BID  . multivitamin with minerals  1 tablet Oral Daily  . nicotine  14 mg Transdermal Daily  . sodium chloride flush  3 mL Intravenous Q12H  . sodium chloride flush  3 mL Intravenous Q12H  . sodium chloride flush  3 mL Intravenous Q12H  . thiamine  100 mg Oral Daily   Or  . thiamine  100 mg Intravenous Daily   Continuous Infusions: . sodium chloride    . sodium chloride    . heparin 1,100 Units/hr (09/30/19 0031)   PRN Meds: sodium chloride, sodium chloride, acetaminophen, bisacodyl, diazepam, HYDROcodone-acetaminophen, LORazepam **OR** LORazepam, morphine injection, ondansetron (ZOFRAN) IV, ondansetron **OR** [DISCONTINUED] ondansetron (ZOFRAN) IV, polyethylene glycol, sodium chloride flush, sodium chloride flush, zolpidem   Vital Signs    Vitals:   09/30/19 0605 09/30/19 0605 09/30/19 0607 09/30/19 0726  BP:  105/78  120/76  Pulse:  (!) 103 (!) 103 (!) 101  Resp:  20  20  Temp:  98.4 F (36.9 C)  98.6 F (37 C)  TempSrc:  Oral  Oral  SpO2:  (!) 59% (!) 68% 90%  Weight: 73.1 kg     Height:        Intake/Output Summary (Last 24 hours) at 09/30/2019 5361 Last data filed at 09/30/2019 0411 Gross per 24 hour  Intake 640.7 ml  Output 375 ml  Net 265.7 ml   Filed Weights   09/28/19 0052 09/29/19 0416 09/30/19 0605  Weight: 77.8 kg 71.9 kg 73.1 kg    Telemetry    Sinus tachycardia at 101bpm - Personally Reviewed  ECG    Sinus tachycardia with septal infarct - Personally  Reviewed  Physical Exam   GEN: Well nourished, well developed in no acute distress HEENT: Normal NECK: No JVD; No carotid bruits LYMPHATICS: No lymphadenopathy CARDIAC:regular and tachy, no murmurs, rubs, gallops RESPIRATORY:  Scattered wheezes on exam ABDOMEN: Soft, non-tender, non-distended MUSCULOSKELETAL:  No edema; No deformity  SKIN: Warm and dry NEUROLOGIC:  Alert and oriented x 3 PSYCHIATRIC:  Normal affect    Labs    Chemistry Recent Labs  Lab 09/27/19 1912 09/27/19 1912 09/28/19 1104 09/28/19 1104 09/29/19 0001 09/29/19 1559 09/29/19 1625 09/29/19 1627 09/30/19 0459  NA 138   < > 142   < > 142   < > 140 139 140  K 4.2   < > 4.0   < > 3.7   < > 3.9 3.8 3.8  CL 97*   < > 95*  --  93*  --   --   --  90*  CO2 30   < > 35*  --  39*  --   --   --  40*  GLUCOSE 84   < > 75  --  150*  --   --   --  128*  BUN 22   < > 14  --  17  --   --   --  18  CREATININE 1.64*   < > 1.36*  --  1.39*  --   --   --  1.95*  CALCIUM 8.7*   < > 8.7*  --  8.8*  --   --   --  8.7*  PROT 7.6  --  7.1  --   --   --   --   --  6.5  ALBUMIN 3.7  --  3.4*  --   --   --   --   --  3.1*  AST 71*  --  67*  --   --   --   --   --  69*  ALT 84*  --  76*  --   --   --   --   --  63*  ALKPHOS 71  --  70  --   --   --   --   --  74  BILITOT 1.5*  --  3.3*  --   --   --   --   --  1.6*  GFRNONAA 43*   < > 54*  --  53*  --   --   --  35*  GFRAA 50*   < > >60  --  >60  --   --   --  41*  ANIONGAP 11   < > 12  --  10  --   --   --  10   < > = values in this interval not displayed.     Hematology Recent Labs  Lab 09/28/19 1104 09/28/19 1104 09/29/19 0001 09/29/19 1559 09/29/19 1625 09/29/19 1627 09/30/19 0459  WBC 4.7  --  5.4  --   --   --  5.3  RBC 4.10*  --  4.14*  --   --   --  3.96*  HGB 13.6   < > 14.0   < > 15.6 15.6 13.3  HCT 39.2   < > 40.7   < > 46.0 46.0 39.4  MCV 95.6  --  98.3  --   --   --  99.5  MCH 33.2  --  33.8  --   --   --  33.6  MCHC 34.7  --  34.4  --   --   --   33.8  RDW 16.5*  --  16.6*  --   --   --  16.7*  PLT 125*  --  129*  --   --   --  119*   < > = values in this interval not displayed.    Cardiac EnzymesNo results for input(s): TROPONINI in the last 168 hours. No results for input(s): TROPIPOC in the last 168 hours.   BNP Recent Labs  Lab 09/27/19 1912  BNP 604.8*     DDimer  Recent Labs  Lab 09/27/19 2017  DDIMER 1.17*     Radiology    CARDIAC CATHETERIZATION  Result Date: 09/29/2019  Hemodynamic findings consistent with moderate pulmonary hypertension.  Moderately elevated right heart pressures with PA pressure 63/28 and mean pressure 39 mmHg consistent with at least moderate pulmonary hypertension. PVR: 2.9 WU LVEDP 17 - 18 mmHg. Normal epicardial coronary arteries. RECOMMENDATION: Optimization of fluid status, blood pressure, and cardiac rhythm in this patient diastolic dysfunction who presented with SVT/atrial flutter.  Smoking cessation is essential.   ECHOCARDIOGRAM COMPLETE  Result Date: 09/28/2019    ECHOCARDIOGRAM REPORT   Patient Name:   [name] Date of Exam:  09/28/2019 Medical Rec #:  [id number]  Height:       69.0 in Accession #:    [id number] Weight:       171.5 lb Date of Birth:  Oct 17, 1955  BSA:          1.935 m Patient Age:    63 years   BP:           152/93 mmHg Patient Gender: M          HR:           87 bpm. Exam Location:  Inpatient Procedure: 2D Echo Indications:    CHF 428.21  History:        Patient has no prior history of Echocardiogram examinations.                 Risk Factors:Hypertension and Current Smoker. Tobacco                 dependence. ETOH Dependence.  Sonographer:    Celene Skeen RDCS (AE) Referring Phys: [id number] JENNIFER YATES IMPRESSIONS  1. Left ventricular ejection fraction, by estimation, is 55 to 60%. The left ventricle has normal function. The left ventricle has no regional wall motion abnormalities. There is mild concentric left ventricular hypertrophy. Left ventricular diastolic parameters  are consistent with Grade I diastolic dysfunction (impaired relaxation).  2. Right ventricular systolic function is moderately reduced. The right ventricular size is moderately enlarged. There is moderately elevated pulmonary artery systolic pressure. The estimated right ventricular systolic pressure is 54.7 mmHg.  3. Right atrial size was moderately dilated.  4. The mitral valve is normal in structure. No evidence of mitral valve regurgitation.  5. The aortic valve is tricuspid. Aortic valve regurgitation is moderate. No aortic stenosis is present.  6. The inferior vena cava is dilated in size with <50% respiratory variability, suggesting right atrial pressure of 15 mmHg. FINDINGS  Left Ventricle: Left ventricular ejection fraction, by estimation, is 55 to 60%. The left ventricle has normal function. The left ventricle has no regional wall motion abnormalities. The left ventricular internal cavity size was normal in size. There is  mild concentric left ventricular hypertrophy. Left ventricular diastolic parameters are consistent with Grade I diastolic dysfunction (impaired relaxation). Indeterminate filling pressures. Right Ventricle: The right ventricular size is moderately enlarged. No increase in right ventricular wall thickness. Right ventricular systolic function is moderately reduced. There is moderately elevated pulmonary artery systolic pressure. The tricuspid  regurgitant velocity is 3.15 m/s, and with an assumed right atrial pressure of 15 mmHg, the estimated right ventricular systolic pressure is 54.7 mmHg. Left Atrium: Left atrial size was normal in size. Right Atrium: Right atrial size was moderately dilated. Pericardium: There is no evidence of pericardial effusion. Mitral Valve: The mitral valve is normal in structure. No evidence of mitral valve regurgitation. Tricuspid Valve: The tricuspid valve is normal in structure. Tricuspid valve regurgitation is mild. Aortic Valve: The aortic valve is  tricuspid. Aortic valve regurgitation is moderate. Aortic regurgitation PHT measures 403 msec. No aortic stenosis is present. Pulmonic Valve: The pulmonic valve was not well visualized. Pulmonic valve regurgitation is not visualized. Aorta: The aortic root is normal in size and structure. Venous: The inferior vena cava is dilated in size with less than 50% respiratory variability, suggesting right atrial pressure of 15 mmHg. IAS/Shunts: No atrial level shunt detected by color flow Doppler.  LEFT VENTRICLE PLAX 2D LVIDd:         4.30 cm  Diastology LVIDs:  2.90 cm  LV e' medial:   6.85 cm/s LV PW:         1.20 cm  LV E/e' medial: 11.5 LV IVS:        1.40 cm LVOT diam:     2.00 cm LV SV:         67 LV SV Index:   35 LVOT Area:     3.14 cm  RIGHT VENTRICLE RV S prime:     8.81 cm/s TAPSE (M-mode): 1.8 cm LEFT ATRIUM           Index       RIGHT ATRIUM           Index LA diam:      3.10 cm 1.60 cm/m  RA Area:     25.50 cm LA Vol (A4C): 48.0 ml 24.80 ml/m RA Volume:   82.50 ml  42.63 ml/m  AORTIC VALVE LVOT Vmax:   114.00 cm/s LVOT Vmean:  65.050 cm/s LVOT VTI:    0.214 m AI PHT:      403 msec  AORTA Ao Root diam: 3.40 cm MITRAL VALVE               TRICUSPID VALVE MV Area (PHT): 3.60 cm    TR Peak grad:   39.7 mmHg MV Decel Time: 211 msec    TR Vmax:        315.00 cm/s MV E velocity: 78.80 cm/s MV A velocity: 94.30 cm/s  SHUNTS MV E/A ratio:  0.84        Systemic VTI:  0.21 m                            Systemic Diam: 2.00 cm Rachelle Hora Croitoru MD Electronically signed by Thurmon Fair MD Signature Date/Time: 09/28/2019/4:37:35 PM    Final     Cardiac Studies   2D echo IMPRESSIONS   1. Left ventricular ejection fraction, by estimation, is 55 to 60%. The  left ventricle has normal function. The left ventricle has no regional  wall motion abnormalities. There is mild concentric left ventricular  hypertrophy. Left ventricular diastolic  parameters are consistent with Grade I diastolic dysfunction  (impaired  relaxation).  2. Right ventricular systolic function is moderately reduced. The right  ventricular size is moderately enlarged. There is moderately elevated  pulmonary artery systolic pressure. The estimated right ventricular  systolic pressure is 54.7 mmHg.  3. Right atrial size was moderately dilated.  4. The mitral valve is normal in structure. No evidence of mitral valve  regurgitation.  5. The aortic valve is tricuspid. Aortic valve regurgitation is moderate.  No aortic stenosis is present.  6. The inferior vena cava is dilated in size with <50% respiratory  variability, suggesting right atrial pressure of 15 mmHg.   Patient Profile     64 y.o. male  with a hx of HTN, tobacco and ETOH (1 pint Vodka daliy) abuse and fm hx of CAD with his father having an MI in his late 76's.  Admitted with 1 week hx of DOE and LE edema, Orthopnea and PND and found to be in acute CHF and atrial flutter with RVR.  Assessment & Plan    New onset heart failure Presented with a couple days of worsening sob and LLE. BNP elevated to 604. HS troponin mildly elevated. Albumin 3.4. CXR clear. CTA chest negative for PE.  - Echo showed EF 55-60%, no WMA, G1DD, moderately  reduced RV function, mild LVH, mod AI - started on IV lasix 40 mg BID - I&O's incomplete yesterday but net neg 5.4L thus far that has been counted - weight up 3lbs since yesterday - creatinine bumped this am 1.64>1.36>1.39>1.95 post cath  - continue Lopressor 12.5 mg BID - will hold Lasix today with uptrending creatinine - follow I&O's, daily weights and renal function - ? Etiology of right heart failure - Chest CTA neg for acute PE.  RA also enlarged.   - He has moderate pulmonary HTN and with smoking hx, likely has COPD as at least part of the etiology of his elevated PAP and likely a component of pulmonary venous HTN from diastolic CHF based on RHC showing elevated LVEDP - PVR borderline at 2.9WU - check PFTs    SVT/atrial flutter with RVR - on admission noted to be in SVT broke with adenosine and subsequent rhythm thought to be atrial flutter - maintaining NSR on tele - TSH 0.914 - K+ 4.0, Mag 0.8>>supplement - CHADS2VASC score is 2 - stop Heparin and start Eliquis 5mg  BID today - continue on Lopressor 25mg  BID  Elevated Troponin - HS troponin mildly elevated at 70>65 - Patient has RF for CAD including HTN, ETOH, tobacco use, family history - likely related to CHF as normal coronary arteries on cath - continue ASA  HTN - not on meds at baseline - BP controlled today - continue Lopressor to 25mg  BID  HLD - started on lipitor - LDL 71  ETOH dependence - chronic>>plans to stop - CIWA - per IM  Tobacco dependence - patient plans to stop - wheezing on exam - suspect he has COPD - check PFTs   I have spent a total of 35 minutes with patient reviewing hospital notes, 2D echo , telemetry, EKGs, labs and examining patient as well as establishing an assessment and plan that was discussed with the patient.  > 50% of time was spent in direct patient care.       For questions or updates, please contact [facility] Please consult [email or web address] for contact info under Cardiology/STEMI.      Signed, Armanda Magic, MD  09/30/2019, 8:21 AM

## 2019-09-30 NOTE — Social Work (Signed)
CSW met with pt bedside. CSW introduced herself and explained her role. CSW completed sbirt, after initially saying no. Pt scored a 23 on the sbirt scale. Pt stated that he drinks a pint of liquor a day. Pt reports he starts drinking in the morning and stops when the bottle is gone. Pt reports he has only recently started drinking alcohol. Pt reports he retired from his job and got bored and started hanging out with other people who abuse alcohol. 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   CSW and pt discussed the effects of alcohol can impact health. Pt stated he does not go to the doctor but reports he will now keep up with his appoitments because he is not ready to die. Pt reports he plans on quitting drinking Pt shared that he has a good support system at home and he has a young grand baby and he doesn't want his drinking to negatively impact his family.   CSW shared resources. Pt was receptive to resources.  Emeterio Reeve, Latanya Presser, [place] Licensed Clinical Social Worker [phone]

## 2019-10-01 ENCOUNTER — Inpatient Hospital Stay (HOSPITAL_COMMUNITY): Payer: Medicare Other

## 2019-10-01 LAB — PULMONARY FUNCTION TEST
FEF 25-75 Pre: 0.24 L/sec
FEF2575-%Pred-Pre: 9 %
FEV1-%Pred-Pre: 20 %
FEV1-Pre: 0.58 L
FEV1FVC-%Pred-Pre: 52 %
FEV6-%Pred-Pre: 36 %
FEV6-Pre: 1.33 L
FEV6FVC-%Pred-Pre: 96 %
FVC-%Pred-Pre: 37 %
FVC-Pre: 1.43 L
Pre FEV1/FVC ratio: 41 %
Pre FEV6/FVC Ratio: 93 %

## 2019-10-01 LAB — BASIC METABOLIC PANEL
Anion gap: 13 (ref 5–15)
BUN: 26 mg/dL — ABNORMAL HIGH (ref 8–23)
CO2: 37 mmol/L — ABNORMAL HIGH (ref 22–32)
Calcium: 9.2 mg/dL (ref 8.9–10.3)
Chloride: 89 mmol/L — ABNORMAL LOW (ref 98–111)
Creatinine, Ser: 2.79 mg/dL — ABNORMAL HIGH (ref 0.61–1.24)
GFR calc Af Amer: 26 mL/min — ABNORMAL LOW (ref >60)
GFR calc non Af Amer: 23 mL/min — ABNORMAL LOW (ref >60)
Glucose, Bld: 102 mg/dL — ABNORMAL HIGH (ref 70–99)
Potassium: 4.6 mmol/L (ref 3.5–5.1)
Sodium: 139 mmol/L (ref 135–145)

## 2019-10-01 LAB — URINALYSIS, ROUTINE W REFLEX MICROSCOPIC
Bilirubin Urine: NEGATIVE
Glucose, UA: NEGATIVE mg/dL
Hgb urine dipstick: NEGATIVE
Ketones, ur: NEGATIVE mg/dL
Nitrite: NEGATIVE
Protein, ur: 100 mg/dL — AB
Specific Gravity, Urine: 1.02 (ref 1.005–1.030)
pH: 6 (ref 5.0–8.0)

## 2019-10-01 LAB — NA AND K (SODIUM & POTASSIUM), RAND UR
Potassium Urine: 48 mmol/L
Sodium, Ur: 41 mmol/L

## 2019-10-01 LAB — CBC
HCT: 41.8 % (ref 39.0–52.0)
Hemoglobin: 14.2 g/dL (ref 13.0–17.0)
MCH: 32.9 pg (ref 26.0–34.0)
MCHC: 34 g/dL (ref 30.0–36.0)
MCV: 96.8 fL (ref 80.0–100.0)
Platelets: 122 10*3/uL — ABNORMAL LOW (ref 150–400)
RBC: 4.32 MIL/uL (ref 4.22–5.81)
RDW: 16.2 % — ABNORMAL HIGH (ref 11.5–15.5)
WBC: 6.3 10*3/uL (ref 4.0–10.5)
nRBC: 0.8 % — ABNORMAL HIGH (ref 0.0–0.2)

## 2019-10-01 LAB — GLUCOSE, CAPILLARY
Glucose-Capillary: 104 mg/dL — ABNORMAL HIGH (ref 70–99)
Glucose-Capillary: 98 mg/dL (ref 70–99)
Glucose-Capillary: 102 mg/dL — ABNORMAL HIGH (ref 70–99)
Glucose-Capillary: 83 mg/dL (ref 70–99)

## 2019-10-01 MED ORDER — SODIUM CHLORIDE 0.9 % IV SOLN: INTRAVENOUS | Status: DC

## 2019-10-01 MED ORDER — METOPROLOL SUCCINATE ER 25 MG PO TB24
25.0000 mg | ORAL_TABLET | Freq: Every day | ORAL | Status: DC
  Administered 2019-10-01: 25 mg via ORAL
  Filled 2019-10-01: qty 1

## 2019-10-01 NOTE — Progress Notes (Addendum)
Progress Note  Patient Name: Aaron Fuller Date of Encounter: 10/01/2019  Primary Cardiologist: Armanda Magic, MD   Subjective   No CP or SOB  He is net neg 5.3L since admit with diuresis.   Inpatient Medications    Scheduled Meds: . apixaban  5 mg Oral BID  . atorvastatin  20 mg Oral q1800  . docusate sodium  100 mg Oral BID  . folic acid  1 mg Oral Daily  . magnesium oxide  400 mg Oral BID  . metoprolol tartrate  25 mg Oral BID  . multivitamin with minerals  1 tablet Oral Daily  . nicotine  14 mg Transdermal Daily  . sodium chloride flush  3 mL Intravenous Q12H  . sodium chloride flush  3 mL Intravenous Q12H  . sodium chloride flush  3 mL Intravenous Q12H  . thiamine  100 mg Oral Daily   Or  . thiamine  100 mg Intravenous Daily   Continuous Infusions: . sodium chloride    . sodium chloride     PRN Meds: sodium chloride, sodium chloride, acetaminophen, bisacodyl, HYDROcodone-acetaminophen, LORazepam **OR** LORazepam, morphine injection, ondansetron (ZOFRAN) IV, ondansetron **OR** [DISCONTINUED] ondansetron (ZOFRAN) IV, polyethylene glycol, sodium chloride flush, sodium chloride flush, zolpidem   Vital Signs    Vitals:   09/30/19 2023 09/30/19 2111 10/01/19 0032 10/01/19 0438  BP: 117/74  98/69 115/90  Pulse: 91  85 86  Resp: 19  (!) 23 (!) 22  Temp: 98.2 F (36.8 C)  98.1 F (36.7 C) 97.9 F (36.6 C)  TempSrc: Oral  Oral Oral  SpO2: (!) 86% (!) 89% 90% (!) 89%  Weight:    73.1 kg  Height:        Intake/Output Summary (Last 24 hours) at 10/01/2019 0715 Last data filed at 09/30/2019 2100 Gross per 24 hour  Intake 360 ml  Output 250 ml  Net 110 ml   Filed Weights   09/29/19 0416 09/30/19 0605 10/01/19 0438  Weight: 71.9 kg 73.1 kg 73.1 kg    Telemetry    NSR - Personally Reviewed  ECG    NSR with no ST changes- Personally Reviewed  Physical Exam   GEN: Well nourished, well developed in no acute distress HEENT: Normal NECK: No JVD; No carotid  bruits LYMPHATICS: No lymphadenopathy CARDIAC:RRR, no murmurs, rubs, gallops RESPIRATORY:  Clear to auscultation without rales, wheezing or rhonchi  ABDOMEN: Soft, non-tender, non-distended MUSCULOSKELETAL:  No edema; No deformity  SKIN: Warm and dry NEUROLOGIC:  Alert and oriented x 3 PSYCHIATRIC:  Normal affect    Labs    Chemistry Recent Labs  Lab 09/27/19 1912 09/27/19 1912 09/28/19 1104 09/28/19 1104 09/29/19 0001 09/29/19 1559 09/29/19 1627 09/30/19 0459 10/01/19 0424  NA 138   < > 142   < > 142   < > 139 140 139  K 4.2   < > 4.0   < > 3.7   < > 3.8 3.8 4.6  CL 97*   < > 95*   < > 93*  --   --  90* 89*  CO2 30   < > 35*   < > 39*  --   --  40* 37*  GLUCOSE 84   < > 75   < > 150*  --   --  128* 102*  BUN 22   < > 14   < > 17  --   --  18 26*  CREATININE 1.64*   < > 1.36*   < >  1.39*  --   --  1.95* 2.79*  CALCIUM 8.7*   < > 8.7*   < > 8.8*  --   --  8.7* 9.2  PROT 7.6  --  7.1  --   --   --   --  6.5  --   ALBUMIN 3.7  --  3.4*  --   --   --   --  3.1*  --   AST 71*  --  67*  --   --   --   --  69*  --   ALT 84*  --  76*  --   --   --   --  63*  --   ALKPHOS 71  --  70  --   --   --   --  74  --   BILITOT 1.5*  --  3.3*  --   --   --   --  1.6*  --   GFRNONAA 43*   < > 54*   < > 53*  --   --  35* 23*  GFRAA 50*   < > >60   < > >60  --   --  41* 26*  ANIONGAP 11   < > 12   < > 10  --   --  10 13   < > = values in this interval not displayed.     Hematology Recent Labs  Lab 09/29/19 0001 09/29/19 1559 09/29/19 1627 09/30/19 0459 10/01/19 0424  WBC 5.4  --   --  5.3 6.3  RBC 4.14*  --   --  3.96* 4.32  HGB 14.0   < > 15.6 13.3 14.2  HCT 40.7   < > 46.0 39.4 41.8  MCV 98.3  --   --  99.5 96.8  MCH 33.8  --   --  33.6 32.9  MCHC 34.4  --   --  33.8 34.0  RDW 16.6*  --   --  16.7* 16.2*  PLT 129*  --   --  119* 122*   < > = values in this interval not displayed.    Cardiac EnzymesNo results for input(s): TROPONINI in the last 168 hours. No results for  input(s): TROPIPOC in the last 168 hours.   BNP Recent Labs  Lab 09/27/19 1912  BNP 604.8*     DDimer  Recent Labs  Lab 09/27/19 2017  DDIMER 1.17*     Radiology    CARDIAC CATHETERIZATION  Result Date: 09/29/2019  Hemodynamic findings consistent with moderate pulmonary hypertension.  Moderately elevated right heart pressures with PA pressure 63/28 and mean pressure 39 mmHg consistent with at least moderate pulmonary hypertension. PVR: 2.9 WU LVEDP 17 - 18 mmHg. Normal epicardial coronary arteries. RECOMMENDATION: Optimization of fluid status, blood pressure, and cardiac rhythm in this patient diastolic dysfunction who presented with SVT/atrial flutter.  Smoking cessation is essential.    Cardiac Studies   2D echo IMPRESSIONS   1. Left ventricular ejection fraction, by estimation, is 55 to 60%. The  left ventricle has normal function. The left ventricle has no regional  wall motion abnormalities. There is mild concentric left ventricular  hypertrophy. Left ventricular diastolic  parameters are consistent with Grade I diastolic dysfunction (impaired  relaxation).  2. Right ventricular systolic function is moderately reduced. The right  ventricular size is moderately enlarged. There is moderately elevated  pulmonary artery systolic pressure. The estimated right ventricular  systolic pressure is 54.7  mmHg.  3. Right atrial size was moderately dilated.  4. The mitral valve is normal in structure. No evidence of mitral valve  regurgitation.  5. The aortic valve is tricuspid. Aortic valve regurgitation is moderate.  No aortic stenosis is present.  6. The inferior vena cava is dilated in size with <50% respiratory  variability, suggesting right atrial pressure of 15 mmHg.   Cardiac Cath 09/2019 Conclusion    Hemodynamic findings consistent with moderate pulmonary hypertension.   Moderately elevated right heart pressures with PA pressure 63/28 and mean pressure 39  mmHg consistent with at least moderate pulmonary hypertension.  PVR: 2.9 WU  LVEDP 17 - 18 mmHg.  Normal epicardial coronary arteries.  RECOMMENDATION: Optimization of fluid status, blood pressure, and cardiac rhythm in this patient diastolic dysfunction who presented with SVT/atrial flutter.  Smoking cessation is essential.     Patient Profile     64 y.o. male  with a hx of HTN, tobacco and ETOH (1 pint Vodka daliy) abuse and fm hx of CAD with his father having an MI in his late 26's.  Admitted with 1 week hx of DOE and LE edema, Orthopnea and PND and found to be in acute CHF and atrial flutter with RVR.  Assessment & Plan    New onset heart failure Presented with a couple days of worsening sob and LLE. BNP elevated to 604. HS troponin mildly elevated. Albumin 3.4. CXR clear. CTA chest negative for PE.  - Echo showed EF 55-60%, no WMA, G1DD, moderately reduced RV function, mild LVH, mod AI - started on IV lasix 40 mg BID - I&O's incomplete yesterday but net neg 5.3L thus far that has been counted - weight down 17lbs from admit - creatinine continues to climb this am 1.64>1.36>1.39>1.95>2.79 post cath - continue to hold Lasix  - change Lopresor to Toprol XL 25mg  daily due to wheezing yesterday and probable COPD - follow I&O's, daily weights and renal function - ? Etiology of right heart failure - Chest CTA neg for acute PE.  RA also enlarged.   - He has moderate pulmonary HTN and with smoking hx, likely has COPD as at least part of the etiology of his elevated PAP and likely a component of pulmonary venous HTN from diastolic CHF based on RHC showing elevated LVEDP - PVR borderline at 2.9WU - PFTs pending  SVT/atrial flutter with RVR - on admission noted to be in SVT broke with adenosine and subsequent rhythm thought to be atrial flutter - maintaining NSR on tele - TSH 0.914 - K+ 4.6, Mag 2.1 - CHADS2VASC score is 2 - continue Eliquis 5mg  BID today - continue on  BB  Elevated Troponin - HS troponin mildly elevated at 70>65 - Patient has RF for CAD including HTN, ETOH, tobacco use, family history - likely related to CHF as normal coronary arteries on cath - continue ASA  HTN - not on meds at baseline - BP controlled today at 115/74mmHg - change Lopressor tartrate to Toprol XL 25mg  daily due to probable COPD and SOB  HLD - started on lipitor - LDL 71  ETOH dependence - chronic>>plans to stop - CIWA - per IM  Tobacco dependence - patient plans to stop - suspect he has COPD - O2 Sats 89% on RA - he denies SOB but appears short winded when he talks - wheezing improved from yesterday - start O2 at 2L - PFTs pending -r epeat Cxray - per TRH  AKI -likely related to cath  -  Will ask nephrology to see 1.64>1.36>1.39>1.95>2.79 -diuretics have been on hold (elevated filling pressures on cath) -may need to gently hydrate  I have spent a total of 35 minutes with patient reviewing hospital notes, 2D echo , telemetry, EKGs, labs and examining patient as well as establishing an assessment and plan that was discussed with the patient.  > 50% of time was spent in direct patient care.       For questions or updates, please contact [facility] Please consult [email or web address] for contact info under Cardiology/STEMI.      Signed, Armanda Magic, MD  10/01/2019, 7:15 AM

## 2019-10-01 NOTE — TOC Initial Note (Signed)
Transition of Care [facility]) - Initial/Assessment Note    Patient Details  Name: Aaron Fuller MRN: [id number] Date of Birth: 1955/12/20  Transition of Care [facility]) CM/SW Contact:    Gildardo Griffes, LCSW Phone Number: 10/01/2019, 4:25 PM  Clinical Narrative:                  CSW spoke with patient regarding discharge planning and home health services, patient agreeable to home health PT with no preference of agency.   CSW spoke with Elnita Maxwell with [facility] and she is able to accept patient for home health PT, CSW also provided her with patient's updated address of [address] [place] [address].   Patient reports no DME needs at this time.   Expected Discharge Plan: Home w Home Health Services Barriers to Discharge: Continued Medical Work up   Patient Goals and CMS Choice Patient states their goals for this hospitalization and ongoing recovery are:: to go home CMS Medicare.gov Compare Post Acute Care list provided to:: Patient Choice offered to / list presented to : Patient  Expected Discharge Plan and Services Expected Discharge Plan: Home w Home Health Services     Post Acute Care Choice: Home Health Living arrangements for the past 2 months: Single Family Home                           HH Arranged: PT HH Agency: [facility] Date [facility] Agency Contacted: 10/01/19 Time HH Agency Contacted: 1625 Representative spoke with at [facility] Agency: Elnita Maxwell  Prior Living Arrangements/Services Living arrangements for the past 2 months: Single Family Home Lives with:: Self Patient language and need for interpreter reviewed:: Yes Do you feel safe going back to the place where you live?: Yes      Need for Family Participation in Patient Care: Yes (Comment) Care giver support system in place?: Yes (comment)   Criminal Activity/Legal Involvement Pertinent to Current Situation/Hospitalization: No - Comment as needed  Activities of Daily Living Home Assistive Devices/Equipment:  None ADL Screening (condition at time of admission) Patient's cognitive ability adequate to safely complete daily activities?: Yes Is the patient deaf or have difficulty hearing?: No Does the patient have difficulty seeing, even when wearing glasses/contacts?: No Does the patient have difficulty concentrating, remembering, or making decisions?: No Patient able to express need for assistance with ADLs?: No Does the patient have difficulty dressing or bathing?: No Independently performs ADLs?: Yes (appropriate for developmental age) Does the patient have difficulty walking or climbing stairs?: No Weakness of Legs: Both Weakness of Arms/Hands: None  Permission Sought/Granted Permission sought to share information with : Case Manager, Magazine features editor, Family Supports Permission granted to share information with : Yes, Verbal Permission Granted     Permission granted to share info w AGENCY: Home Health        Emotional Assessment Appearance:: Appears stated age Attitude/Demeanor/Rapport: Gracious Affect (typically observed): Calm Orientation: : Oriented to Self, Oriented to Place, Oriented to  Time, Oriented to Situation Alcohol / Substance Use: Not Applicable Psych Involvement: No (comment)  Admission diagnosis:  Atrial flutter (HCC) [I48.92] Tachycardia [R00.0] Acute respiratory failure with hypoxia (HCC) [J96.01] Hypervolemia, unspecified hypervolemia type [E87.70] Patient Active Problem List   Diagnosis Date Noted  . New onset of congestive heart failure (HCC) 09/28/2019  . Alcohol dependence (HCC)   . Hypertension   . Tobacco dependence   . Tachycardia   . Right ventricular dysfunction   .  Pulmonary hypertension, unspecified ([place])   . Acute diastolic heart failure ([place])   . Atrial flutter with rapid ventricular response ([place])   . Atrial tachycardia ([place]) 09/27/2019   PCP:  Patient, No Pcp Per Pharmacy:   [place], [place] [address] Phone: [phone] Fax: [place], [place] - [address] [place] [address] Phone: [phone] Fax: [phone]     Social Determinants of Health (SDOH) Interventions    Readmission Risk Interventions No flowsheet data found.

## 2019-10-01 NOTE — Progress Notes (Signed)
PROGRESS NOTE   Aaron Fuller  XQJ:[id number]    DOB: 05-04-1956    DOA: 09/27/2019  PCP: Patient, No Pcp Per   I have briefly reviewed patients previous medical [facility] in [facility].  Chief Complaint:   Chief Complaint  Patient presents with  . Leg Swelling  . Tachycardia    Brief Narrative:  64 year old male, lives with family, independent, not on home oxygen, PMH of HTN, alcohol dependence, tobacco abuse, admitted to [facility] via [facility] with complaints of dyspnea, orthopnea, PND and bilateral lower extremity edema.  In the ED noted to be in SVT in the 170s, hypertensive, given adenosine 6 mg, rhythm reviewed by cardiology and felt to be possible atrial flutter, Cardizem infusion started, converted to sinus rhythm.  Also hypoxic at 88% on room air.  CTA chest negative for PE.  Admitted for new onset acute diastolic CHF, SVT/atrial flutter with RVR, elevated troponin, acute respiratory failure with hypoxia.  Diuresed with IV Lasix.  S/p cardiac cath 4/7.  Course complicated by AKI, creatinine up to 2.79 on 4/8 and nephrology consulted..   Assessment & Plan:  Principal Problem:   New onset of congestive heart failure (HCC) Active Problems:   Atrial tachycardia (HCC)   Alcohol dependence (HCC)   Hypertension   Tobacco dependence   Tachycardia   Right ventricular dysfunction   Pulmonary hypertension, unspecified (HCC)   Acute diastolic heart failure (HCC)   Atrial flutter with rapid ventricular response ([place])   New onset acute diastolic CHF: BNP 448. HS troponin mildly elevated.  CTA chest negative for PE.  TTE showed LVEF 18-56%, grade 1 diastolic dysfunction and moderately reduced RV function.  Started on IV Lasix 40 mg twice daily.  -5.133 L thus far, may not be fully accurate. Cardiac cath on 4/7 showed elevated filling pressures, pulmonary hypertension and normal coronary arteries.  Initially treated with IV Lasix and diuresed aggressively.  Weight down by 17 pounds since admission.   Due to progressive acute kidney injury, IV Lasix discontinued, last dose 4/7.  Volume status appears okay.  Pulmonary hypertension: RHC showed elevated LVEDP.  Cardiology checking PFTs.   Elevated troponin: Suspected due to demand ischemia in the setting of atrial flutter with RVR, acute CHF and hypoxia.  Cardiac cath 4/7 showed normal coronaries.  SVT/atrial flutter with RVR: Presented with SVT which broke with adenosine and subsequent rhythm thought to be atrial flutter.  Started on IV diltiazem-now discontinued and on metoprolol p.o.  Converted to sinus rhythm.  TSH 0.914.  IV heparin changed to Eliquis by cardiology. CHADS2VASC score is 2.  Cardiology change beta-blockers to Toprol-XL due to concern for COPD.  Acute respiratory failure with hypoxia: Due to decompensated CHF complicating underlying COPD.  Oxygen saturation again at 89% this morning.  Chest x-ray without acute cardiopulmonary disease.  Essential hypertension: Beta-blockers changed to Toprol-XL.  Hyperlipidemia: LDL 71.  Continue atorvastatin.  Alcohol dependence: Abstinence counseled.  On CIWA protocol.  No overt withdrawal.  Tobacco abuse: Cessation counseled.  Continue nicotine patch.  COPD: Tobacco cessation counseled.  As needed bronchodilator nebulizations.  Thrombocytopenia: Likely related to alcohol abuse.  Stable.  Follow daily CBC.  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  Outpatient follow-up.  Body mass index is 23.79 kg/m.   Acute kidney injury: Patient presented with creatinine of 1.64 which had improved to 1.39 but has has progressively increased to 2.7 on 4/8.  Multifactorial due to recent aggressive IV Lasix diuresis, contrast nephropathy from recent cardiac cath and  CTA chest and hemodynamics.  Nephrology input appreciated.  Holding diuretics.  Hydrating with IV normal saline 100 mL/h x 15 hours.  Avoid nephrotoxic's.  Follow BMP in a.m.  Discussed with nephrology.  Hypomagnesemia: 0.8 > 1.3.   Replace aggressively/2.1 today.  Abnormal LFTs: Likely combination of alcohol dependence and hepatic passive congestion from CHF.  Stable.  Improving.  Asymptomatic bacteriuria: Urine culture shows >100 K Aerococcus.   DVT prophylaxis: IV heparin drip Code Status: Full Family Communication: None at bedside Disposition:  . Patient came from: Home           . Anticipated d/c place: Home . Barriers to d/c: Progressively worsening acute kidney injury.   Consultants:   Cardiology  Procedures:   None  Antimicrobials:   None   Subjective:  Denied dyspnea or chest pain.  No wheezing reported.  Urine concentrated in urinal.  Objective:   Vitals:   10/01/19 0032 10/01/19 0438 10/01/19 0852 10/01/19 1127  BP: 98/69 115/90 (!) 145/99 (!) 189/106  Pulse: 85 86 83 79  Resp: (!) 23 (!) 22  18  Temp: 98.1 F (36.7 C) 97.9 F (36.6 C)  97.6 F (36.4 C)  TempSrc: Oral Oral  Oral  SpO2: 90% (!) 89% 94% 100%  Weight:  73.1 kg    Height:        General exam: Middle-age male, moderately built and nourished lying propped up in bed, not in distress but pursed lip breathing with minimal activity. Respiratory system: Distant breath sounds but no overt rhonchi, wheezing or crackles.  Pursed lip breathing at times. Cardiovascular system: S1 and S2 heard, RRR.  No JVD, murmurs or pedal/ankle edema.  Telemetry personally reviewed: Sinus rhythm. Gastrointestinal system: Abdomen is nondistended, soft and nontender. No organomegaly or masses felt. Normal bowel sounds heard. Central nervous system: Alert and oriented. No focal neurological deficits. Extremities: Symmetric 5 x 5 power. Skin: No rashes, lesions or ulcers Psychiatry: Judgement and insight appear normal. Mood & affect appropriate.     Data Reviewed:   I have personally reviewed following labs and imaging studies   CBC: Recent Labs  Lab 09/27/19 1912 09/28/19 1104 09/29/19 0001 09/29/19 1559 09/29/19 1627 09/30/19 0459  10/01/19 0424  WBC 6.0   < > 5.4  --   --  5.3 6.3  NEUTROABS 2.5  --  3.0  --   --   --   --   HGB 14.1   < > 14.0   < > 15.6 13.3 14.2  HCT 40.4   < > 40.7   < > 46.0 39.4 41.8  MCV 96.7   < > 98.3  --   --  99.5 96.8  PLT 130*   < > 129*  --   --  119* 122*   < > = values in this interval not displayed.    Basic Metabolic Panel: Recent Labs  Lab 09/28/19 1104 09/28/19 1104 09/29/19 0001 09/29/19 1559 09/29/19 1627 09/30/19 0459 10/01/19 0424  NA 142   < > 142   < > 139 140 139  K 4.0   < > 3.7   < > 3.8 3.8 4.6  CL 95*   < > 93*  --   --  90* 89*  CO2 35*   < > 39*  --   --  40* 37*  GLUCOSE 75   < > 150*  --   --  128* 102*  BUN 14   < > 17  --   --  18 26*  CREATININE 1.36*   < > 1.39*  --   --  1.95* 2.79*  CALCIUM 8.7*   < > 8.8*  --   --  8.7* 9.2  MG 0.8*  --  1.3*  --   --  2.1  --   PHOS 3.5  --   --   --   --   --   --    < > = values in this interval not displayed.    Liver Function Tests: Recent Labs  Lab 09/27/19 1912 09/28/19 1104 09/30/19 0459  AST 71* 67* 69*  ALT 84* 76* 63*  ALKPHOS 71 70 74  BILITOT 1.5* 3.3* 1.6*  PROT 7.6 7.1 6.5  ALBUMIN 3.7 3.4* 3.1*    CBG: Recent Labs  Lab 09/30/19 2115 10/01/19 0559 10/01/19 1129  GLUCAP 104* 98 102*    Microbiology Studies:   Recent Results (from the past 240 hour(s))  SARS Coronavirus 2 Ag (30 min TAT) - Nasal Swab (BD Veritor Kit)     Status: None   Collection Time: 09/27/19  8:16 PM   Specimen: Nasal Swab (BD Veritor Kit)  Result Value Ref Range Status   SARS Coronavirus 2 Ag NEGATIVE NEGATIVE Final    Comment: (NOTE) SARS-CoV-2 antigen NOT DETECTED.  Negative results are presumptive.  Negative results do not preclude SARS-CoV-2 infection and should not be used as the sole basis for treatment or other patient management decisions, including infection  control decisions, particularly in the presence of clinical signs and  symptoms consistent with COVID-19, or in those who have been  in contact with the virus.  Negative results must be combined with clinical observations, patient history, and epidemiological information. The expected result is Negative. Fact Sheet for Patients: PodPark.tn Fact Sheet for Healthcare Providers: GiftContent.is This test is not yet approved or cleared by the [place] FDA and  has been authorized for detection and/or diagnosis of SARS-CoV-2 by FDA under an Emergency Use Authorization (EUA).  This EUA will remain in effect (meaning this test can be used) for the duration of  the COVID-19 de claration under Section 564(b)(1) of the Act, 21 U.S.C. section 360bbb-3(b)(1), unless the authorization is terminated or revoked sooner. Performed at [facility], [address] [place], [place] [address]   SARS CORONAVIRUS 2 (TAT 6-24 HRS) Nasopharyngeal Nasopharyngeal Swab     Status: None   Collection Time: 09/27/19  8:17 PM   Specimen: Nasopharyngeal Swab  Result Value Ref Range Status   SARS Coronavirus 2 NEGATIVE NEGATIVE Final    Comment: (NOTE) SARS-CoV-2 target nucleic acids are NOT DETECTED. The SARS-CoV-2 RNA is generally detectable in upper and lower respiratory specimens during the acute phase of infection. Negative results do not preclude SARS-CoV-2 infection, do not rule out co-infections with other pathogens, and should not be used as the sole basis for treatment or other patient management decisions. Negative results must be combined with clinical observations, patient history, and epidemiological information. The expected result is Negative. Fact Sheet for Patients: SugarRoll.be Fact Sheet for Healthcare Providers: https://www.woods-mathews.com/ This test is not yet approved or cleared by the [place] FDA and  has been authorized for detection and/or diagnosis of SARS-CoV-2 by FDA under an Emergency Use  Authorization (EUA). This EUA will remain  in effect (meaning this test can be used) for the duration of the COVID-19 declaration under Section 56 4(b)(1) of the Act, 21 U.S.C. section 360bbb-3(b)(1), unless the authorization is terminated  or revoked sooner. Performed at [facility], [place] [address], [place], [place] [address]   Urine culture     Status: Abnormal   Collection Time: 09/27/19  9:59 PM   Specimen: Urine, Random  Result Value Ref Range Status   Specimen Description   Final    URINE, RANDOM Performed at [facility], [address] [place], [place] [address]    Special Requests   Final    NONE Performed at [facility], [address] [place], [place] [address]    Culture (A)  Final    >=100,000 COLONIES/mL AEROCOCCUS SPECIES Standardized susceptibility testing for this organism is not available. Performed at [facility], [place] [address] [place], [place] [address]    Report Status 09/30/2019 FINAL  Final     Radiology Studies:  DG Chest 2 View  Result Date: 10/01/2019 CLINICAL DATA:  Dyspnea EXAM: CHEST - 2 VIEW COMPARISON:  09/27/2019 FINDINGS: Cardiac shadow is mildly enlarged but stable. The lungs are well aerated bilaterally. No focal infiltrate or sizable effusion is seen. No acute bony abnormality is noted. IMPRESSION: No active cardiopulmonary disease. Electronically Signed   By: Inez Catalina M.D.   On: 10/01/2019 09:39     Scheduled Meds:   . apixaban  5 mg Oral BID  . atorvastatin  20 mg Oral q1800  . docusate sodium  100 mg Oral BID  . folic acid  1 mg Oral Daily  . magnesium oxide  400 mg Oral BID  . metoprolol succinate  25 mg Oral Daily  . multivitamin with minerals  1 tablet Oral Daily  . nicotine  14 mg Transdermal Daily  . sodium chloride flush  3 mL Intravenous Q12H  . sodium chloride flush  3 mL Intravenous Q12H  . sodium chloride flush  3 mL Intravenous Q12H  . thiamine  100 mg Oral Daily   Or  .  thiamine  100 mg Intravenous Daily    Continuous Infusions:   . sodium chloride    . sodium chloride    . sodium chloride 100 mL/hr at 10/01/19 1410     LOS: 4 days     Vernell Leep, MD, [place], [facility]. [facility]    To contact the attending provider between 7A-7P or the covering provider during after hours 7P-7A, please log into the web site [email or web address] and access using universal [place] password for that web site. If you do not have the password, please call the hospital operator.  10/01/2019, 6:31 PM

## 2019-10-01 NOTE — Consult Note (Addendum)
[facility] Nephrology Consultation Note  Requesting MD: Dr Marcellus Scott Reason for consult: AKI  HPI:  Aaron Fuller is a 64 y.o. male with history of HTN, tobacco and alcohol abuse, presented on 4/5 for worsening lower extremity edema and shortness of breath due to CHF exacerbation, seen as a consultation at the request of Dr. Waymon Amato and the cardiology team for acute kidney injury.  In the ER, patient had SVT with heart rate in 170s, hypertensive.  EKG rhythm consistent with atrial flutter with RVR. The cardiology was consulted who started on Cardizem drip.  The creatinine level was 1.64.  Patient underwent a CT angiogram of the chest  which was negative for PE.  He was admitted for new onset of acute diastolic CHF, atrial flutter with RVR, respiratory failure with hypoxia.  Initially treated with IV diuretics with creatinine level trending down to 1.3.  Subsequently patient underwent left and right heart cath on 4/7.  Around 50 mL of IV contrast was used during the cath.  Subsequently, the creatinine level elevated to 1.95 and then 2.79 today.  The diuretics was held and we are consulted to evaluate the patient.  Patient reported that he was diagnosed with hypertension but he is not sure about the medications.  He follows at [facility].   Denies any known history of kidney disease.  Denies use of NSAIDs.  Currently patient feels much better.  Denies headache, dizziness, nausea, vomiting, chest pain, shortness of breath.  No urinary complaint.  The urine output decreases after stopping diuretics.  Urinalysis with possible UTI and proteinuria.  The chest x-ray was normal today.   Creatinine, Ser  Date/Time Value Ref Range Status  10/01/2019 04:24 AM 2.79 (H) 0.61 - 1.24 mg/dL Final  73/71/0626 94:85 AM 1.95 (H) 0.61 - 1.24 mg/dL Final  46/27/0350 09:38 AM 1.39 (H) 0.61 - 1.24 mg/dL Final  18/29/9371 69:67 AM 1.36 (H) 0.61 - 1.24 mg/dL Final  89/38/1017 51:02 PM  1.64 (H) 0.61 - 1.24 mg/dL Final     PMHx:   Past Medical History:  Diagnosis Date  . Alcohol dependence (HCC)   . Hypertension   . Tobacco dependence     Past Surgical History:  Procedure Laterality Date  . RIGHT/LEFT HEART CATH AND CORONARY ANGIOGRAPHY Bilateral 09/29/2019   Procedure: RIGHT/LEFT HEART CATH AND CORONARY ANGIOGRAPHY;  Surgeon: Lennette Bihari, MD;  Location: [facility];  Service: Cardiovascular;  Laterality: Bilateral;  . ulcer surgery      Family Hx:  Family History  Problem Relation Age of Onset  . CAD Mother   . Diabetes Mother   . Heart failure Mother   . CAD Father   . Diabetes Sister     Social History:  reports that he has been smoking. He has a 49.00 pack-year smoking history. He has never used smokeless tobacco. He reports current alcohol use. He reports that he does not use drugs.  Allergies:  Allergies  Allergen Reactions  . Latex Rash    Medications: Prior to Admission medications   Medication Sig Start Date End Date Taking? Authorizing Provider  diphenhydrAMINE (BENADRYL) 25 mg capsule Take 25 mg by mouth every 6 (six) hours as needed (for grass/pollen-induced allergic symptoms).    Yes [provider]    I have reviewed the patient's current medications.  Labs:  Results for orders placed or performed during the hospital encounter of 09/27/19 (from the past 48 hour(s))  POCT I-Stat EG7  Status: Abnormal   Collection Time: 09/29/19  3:59 PM  Result Value Ref Range   pH, Ven 7.293 7.250 - 7.430   pCO2, Ven 94.9 (HH) 44.0 - 60.0 mmHg   pO2, Ven 46.0 (H) 32.0 - 45.0 mmHg   Bicarbonate 46.0 (H) 20.0 - 28.0 mmol/L   TCO2 49 (H) 22 - 32 mmol/L   O2 Saturation 73.0 %   Acid-Base Excess 14.0 (H) 0.0 - 2.0 mmol/L   Sodium 140 135 - 145 mmol/L   Potassium 3.9 3.5 - 5.1 mmol/L   Calcium, Ion 1.19 1.15 - 1.40 mmol/L   HCT 47.0 39.0 - 52.0 %   Hemoglobin 16.0 13.0 - 17.0 g/dL   Patient temperature HIDE    Sample type  VENOUS   POCT I-Stat EG7     Status: Abnormal   Collection Time: 09/29/19  4:00 PM  Result Value Ref Range   pH, Ven 7.297 7.250 - 7.430   pCO2, Ven 93.9 (HH) 44.0 - 60.0 mmHg   pO2, Ven 46.0 (H) 32.0 - 45.0 mmHg   Bicarbonate 45.9 (H) 20.0 - 28.0 mmol/L   TCO2 49 (H) 22 - 32 mmol/L   O2 Saturation 73.0 %   Acid-Base Excess 14.0 (H) 0.0 - 2.0 mmol/L   Sodium 140 135 - 145 mmol/L   Potassium 4.0 3.5 - 5.1 mmol/L   Calcium, Ion 1.19 1.15 - 1.40 mmol/L   HCT 47.0 39.0 - 52.0 %   Hemoglobin 16.0 13.0 - 17.0 g/dL   Patient temperature HIDE    Sample type VENOUS   POCT I-Stat EG7     Status: Abnormal   Collection Time: 09/29/19  4:25 PM  Result Value Ref Range   pH, Ven 7.290 7.250 - 7.430   pCO2, Ven 96.3 (HH) 44.0 - 60.0 mmHg   pO2, Ven 85.0 (H) 32.0 - 45.0 mmHg   Bicarbonate 46.3 (H) 20.0 - 28.0 mmol/L   TCO2 49 (H) 22 - 32 mmol/L   O2 Saturation 94.0 %   Acid-Base Excess 14.0 (H) 0.0 - 2.0 mmol/L   Sodium 140 135 - 145 mmol/L   Potassium 3.9 3.5 - 5.1 mmol/L   Calcium, Ion 1.20 1.15 - 1.40 mmol/L   HCT 46.0 39.0 - 52.0 %   Hemoglobin 15.6 13.0 - 17.0 g/dL   Patient temperature HIDE    Sample type VENOUS    Comment NOTIFIED PHYSICIAN   POCT I-Stat EG7     Status: Abnormal   Collection Time: 09/29/19  4:27 PM  Result Value Ref Range   pH, Ven 7.298 7.250 - 7.430   pCO2, Ven 93.5 (HH) 44.0 - 60.0 mmHg   pO2, Ven 91.0 (H) 32.0 - 45.0 mmHg   Bicarbonate 45.8 (H) 20.0 - 28.0 mmol/L   TCO2 49 (H) 22 - 32 mmol/L   O2 Saturation 95.0 %   Acid-Base Excess 14.0 (H) 0.0 - 2.0 mmol/L   Sodium 139 135 - 145 mmol/L   Potassium 3.8 3.5 - 5.1 mmol/L   Calcium, Ion 1.18 1.15 - 1.40 mmol/L   HCT 46.0 39.0 - 52.0 %   Hemoglobin 15.6 13.0 - 17.0 g/dL   Patient temperature HIDE    Sample type VENOUS    Comment NOTIFIED PHYSICIAN   Basic metabolic panel     Status: Abnormal   Collection Time: 09/30/19  4:59 AM  Result Value Ref Range   Sodium 140 135 - 145 mmol/L   Potassium 3.8 3.5  - 5.1 mmol/L   Chloride  90 (L) 98 - 111 mmol/L   CO2 40 (H) 22 - 32 mmol/L   Glucose, Bld 128 (H) 70 - 99 mg/dL    Comment: Glucose reference range applies only to samples taken after fasting for at least 8 hours.   BUN 18 8 - 23 mg/dL   Creatinine, Ser 7.26 (H) 0.61 - 1.24 mg/dL   Calcium 8.7 (L) 8.9 - 10.3 mg/dL   GFR calc non Af Amer 35 (L) >60 mL/min   GFR calc Af Amer 41 (L) >60 mL/min   Anion gap 10 5 - 15    Comment: Performed at [facility], [address] [place], [place] [address]  Heparin level (unfractionated)     Status: Abnormal   Collection Time: 09/30/19  4:59 AM  Result Value Ref Range   Heparin Unfractionated 0.18 (L) 0.30 - 0.70 IU/mL    Comment: (NOTE) If heparin results are below expected values, and patient dosage has  been confirmed, suggest follow up testing of antithrombin III levels. Performed at [facility], [address] [place], [place] [address]   CBC     Status: Abnormal   Collection Time: 09/30/19  4:59 AM  Result Value Ref Range   WBC 5.3 4.0 - 10.5 K/uL   RBC 3.96 (L) 4.22 - 5.81 MIL/uL   Hemoglobin 13.3 13.0 - 17.0 g/dL   HCT 38.4 53.6 - 46.8 %   MCV 99.5 80.0 - 100.0 fL   MCH 33.6 26.0 - 34.0 pg   MCHC 33.8 30.0 - 36.0 g/dL   RDW 03.2 (H) 12.2 - 48.2 %   Platelets 119 (L) 150 - 400 K/uL    Comment: REPEATED TO VERIFY PLATELET COUNT CONFIRMED BY SMEAR SPECIMEN CHECKED FOR CLOTS    nRBC 1.3 (H) 0.0 - 0.2 %    Comment: Performed at [facility], [address], [place], [place] [address]  Hepatic function panel     Status: Abnormal   Collection Time: 09/30/19  4:59 AM  Result Value Ref Range   Total Protein 6.5 6.5 - 8.1 g/dL   Albumin 3.1 (L) 3.5 - 5.0 g/dL   AST 69 (H) 15 - 41 U/L   ALT 63 (H) 0 - 44 U/L   Alkaline Phosphatase 74 38 - 126 U/L   Total Bilirubin 1.6 (H) 0.3 - 1.2 mg/dL   Bilirubin, Direct 0.7 (H) 0.0 - 0.2 mg/dL   Indirect Bilirubin 0.9 0.3 - 0.9 mg/dL    Comment: Performed at [facility], [address] [place], [place] [address]  Magnesium     Status: None   Collection Time: 09/30/19  4:59 AM  Result Value Ref Range   Magnesium 2.1 1.7 - 2.4 mg/dL    Comment: Performed at [facility], [address] [place], [place] [address]  Glucose, capillary     Status: None   Collection Time: 09/30/19  3:57 PM  Result Value Ref Range   Glucose-Capillary 82 70 - 99 mg/dL    Comment: Glucose reference range applies only to samples taken after fasting for at least 8 hours.  Glucose, capillary     Status: Abnormal   Collection Time: 09/30/19  9:15 PM  Result Value Ref Range   Glucose-Capillary 104 (H) 70 - 99 mg/dL    Comment: Glucose reference range applies only to samples taken after fasting for at least 8 hours.  Basic metabolic panel     Status: Abnormal  Collection Time: 10/01/19  4:24 AM  Result Value Ref Range   Sodium 139 135 - 145 mmol/L   Potassium 4.6 3.5 - 5.1 mmol/L   Chloride 89 (L) 98 - 111 mmol/L   CO2 37 (H) 22 - 32 mmol/L   Glucose, Bld 102 (H) 70 - 99 mg/dL    Comment: Glucose reference range applies only to samples taken after fasting for at least 8 hours.   BUN 26 (H) 8 - 23 mg/dL   Creatinine, Ser 4.36 (H) 0.61 - 1.24 mg/dL   Calcium 9.2 8.9 - 06.7 mg/dL   GFR calc non Af Amer 23 (L) >60 mL/min   GFR calc Af Amer 26 (L) >60 mL/min   Anion gap 13 5 - 15    Comment: Performed at [facility], [address] [place], [place] [address]  CBC     Status: Abnormal   Collection Time: 10/01/19  4:24 AM  Result Value Ref Range   WBC 6.3 4.0 - 10.5 K/uL   RBC 4.32 4.22 - 5.81 MIL/uL   Hemoglobin 14.2 13.0 - 17.0 g/dL   HCT 35.2 48.1 - 85.9 %   MCV 96.8 80.0 - 100.0 fL   MCH 32.9 26.0 - 34.0 pg   MCHC 34.0 30.0 - 36.0 g/dL   RDW 09.3 (H) 11.2 - 16.2 %   Platelets 122 (L) 150 - 400 K/uL   nRBC 0.8 (H) 0.0 - 0.2 %    Comment: Performed at [facility], [address] [place], [place] [address]  Glucose, capillary     Status: None    Collection Time: 10/01/19  5:59 AM  Result Value Ref Range   Glucose-Capillary 98 70 - 99 mg/dL    Comment: Glucose reference range applies only to samples taken after fasting for at least 8 hours.  Glucose, capillary     Status: Abnormal   Collection Time: 10/01/19 11:29 AM  Result Value Ref Range   Glucose-Capillary 102 (H) 70 - 99 mg/dL    Comment: Glucose reference range applies only to samples taken after fasting for at least 8 hours.     ROS:  Pertinent items noted in HPI and remainder of comprehensive ROS otherwise negative.  Physical Exam: Vitals:   10/01/19 0852 10/01/19 1127  BP: (!) 145/99 (!) 189/106  Pulse: 83 79  Resp:  18  Temp:  97.6 F (36.4 C)  SpO2: 94% 100%     General exam: Appears calm and comfortable  Respiratory system: Diffuse occasional expiratory wheeze, no crackle. Cardiovascular system: S1 & S2 heard, RRR.  No pedal edema. Gastrointestinal system: Abdomen is nondistended, soft and nontender. Normal bowel sounds heard. Central nervous system: Alert and oriented. No focal neurological deficits. Extremities: Symmetric 5 x 5 power. Skin: No rashes, lesions or ulcers Psychiatry: Judgement and insight appear normal. Mood & affect appropriate.   Assessment/Plan:  #Acute kidney injury, nonoliguric: Likely due to contrast nephropathy in addition to hemodynamic change related with IV diuretics.  Patient had CT angio on 4/5 and cardiac cath on 4/7 in association with IV lasix.  Agree with holding diuretics.  He looks dry on physical exam therefore I will start gentle IV hydration NS at 100 cc for 15 hours. Repeat urinalysis, urine electrolytes and check kidney ultrasound to evaluate the echogenicity and to rule out obstruction which is less likely. Hopefully the creatinine will plateau in next 1 to 2 days. Avoid ACE inhibitor or ARB.  #New onset acute  diastolic CHF: EF 55 to 60%.  Received IV diuretics which is on hold as above.  Cardiology is  following.  #SVT/atrial flutter with RVR: Starting Eliquis and beta-blocker.  #Hypertension: Blood pressure variable.  On metoprolol.  Volume status acceptable.  #Patient may have undiagnosed underlying COPD given active smoking and wheezing on lung exam.  Defer to primary team.  Thank you for the consult.  Will follow with you.  Lavel Rieman Jaynie Collins 10/01/2019, 1:10 PM  [employer].

## 2019-10-02 LAB — RENAL FUNCTION PANEL
Albumin: 2.9 g/dL — ABNORMAL LOW (ref 3.5–5.0)
Anion gap: 11 (ref 5–15)
BUN: 26 mg/dL — ABNORMAL HIGH (ref 8–23)
CO2: 37 mmol/L — ABNORMAL HIGH (ref 22–32)
Calcium: 8.7 mg/dL — ABNORMAL LOW (ref 8.9–10.3)
Chloride: 90 mmol/L — ABNORMAL LOW (ref 98–111)
Creatinine, Ser: 1.9 mg/dL — ABNORMAL HIGH (ref 0.61–1.24)
GFR calc Af Amer: 42 mL/min — ABNORMAL LOW (ref >60)
GFR calc non Af Amer: 36 mL/min — ABNORMAL LOW (ref >60)
Glucose, Bld: 87 mg/dL (ref 70–99)
Phosphorus: 4.1 mg/dL (ref 2.5–4.6)
Potassium: 4.1 mmol/L (ref 3.5–5.1)
Sodium: 138 mmol/L (ref 135–145)

## 2019-10-02 LAB — CBC
HCT: 38.7 % — ABNORMAL LOW (ref 39.0–52.0)
Hemoglobin: 13.4 g/dL (ref 13.0–17.0)
MCH: 33.3 pg (ref 26.0–34.0)
MCHC: 34.6 g/dL (ref 30.0–36.0)
MCV: 96 fL (ref 80.0–100.0)
Platelets: 116 10*3/uL — ABNORMAL LOW (ref 150–400)
RBC: 4.03 MIL/uL — ABNORMAL LOW (ref 4.22–5.81)
RDW: 15.9 % — ABNORMAL HIGH (ref 11.5–15.5)
WBC: 5.7 10*3/uL (ref 4.0–10.5)
nRBC: 0 % (ref 0.0–0.2)

## 2019-10-02 MED ORDER — METOPROLOL SUCCINATE ER 50 MG PO TB24
50.0000 mg | ORAL_TABLET | Freq: Every day | ORAL | Status: DC
  Administered 2019-10-02 – 2019-10-03 (×2): 50 mg via ORAL
  Filled 2019-10-02 (×2): qty 1

## 2019-10-02 NOTE — Progress Notes (Signed)
Progress Note  Patient Name: Aaron Fuller Date of Encounter: 10/02/2019  Primary Cardiologist: Armanda Magic, MD   Subjective   Denies any chest pain or SOB.  Creatinine improved from 2.79>1.9 today  Inpatient Medications    Scheduled Meds: . apixaban  5 mg Oral BID  . atorvastatin  20 mg Oral q1800  . docusate sodium  100 mg Oral BID  . folic acid  1 mg Oral Daily  . magnesium oxide  400 mg Oral BID  . metoprolol succinate  25 mg Oral Daily  . multivitamin with minerals  1 tablet Oral Daily  . nicotine  14 mg Transdermal Daily  . sodium chloride flush  3 mL Intravenous Q12H  . sodium chloride flush  3 mL Intravenous Q12H  . sodium chloride flush  3 mL Intravenous Q12H  . thiamine  100 mg Oral Daily   Or  . thiamine  100 mg Intravenous Daily   Continuous Infusions: . sodium chloride    . sodium chloride     PRN Meds: sodium chloride, sodium chloride, acetaminophen, bisacodyl, HYDROcodone-acetaminophen, ondansetron (ZOFRAN) IV, ondansetron **OR** [DISCONTINUED] ondansetron (ZOFRAN) IV, polyethylene glycol, sodium chloride flush, sodium chloride flush, zolpidem   Vital Signs    Vitals:   10/01/19 2035 10/02/19 0036 10/02/19 0532 10/02/19 0533  BP: (!) 170/92 126/83  (!) 173/95  Pulse: 84 87  83  Resp: 18 (!) 22  20  Temp: 98.1 F (36.7 C) 98.1 F (36.7 C)  (!) 97.5 F (36.4 C)  TempSrc: Oral Oral  Oral  SpO2: 100% 94%  97%  Weight:   74.6 kg   Height:        Intake/Output Summary (Last 24 hours) at 10/02/2019 0754 Last data filed at 10/02/2019 0600 Gross per 24 hour  Intake 2153.63 ml  Output 475 ml  Net 1678.63 ml   Filed Weights   09/30/19 0605 10/01/19 0438 10/02/19 0532  Weight: 73.1 kg 73.1 kg 74.6 kg    Telemetry    NSR- Personally Reviewed  ECG    No new EKG to reviewchanges- Personally Reviewed  Physical Exam   GEN: Well nourished, well developed in no acute distress HEENT: Normal NECK: No JVD; No carotid bruits LYMPHATICS: No  lymphadenopathy CARDIAC:RRR, no murmurs, rubs, gallops RESPIRATORY:  Clear to auscultation without rales, wheezing or rhonchi  ABDOMEN: Soft, non-tender, non-distended MUSCULOSKELETAL:  No edema; No deformity  SKIN: Warm and dry NEUROLOGIC:  Alert and oriented x 3 PSYCHIATRIC:  Normal affect     Labs    Chemistry Recent Labs  Lab 09/27/19 1912 09/27/19 1912 09/28/19 1104 09/29/19 0001 09/30/19 0459 10/01/19 0424 10/02/19 0359  NA 138   < > 142   < > 140 139 138  K 4.2   < > 4.0   < > 3.8 4.6 4.1  CL 97*   < > 95*   < > 90* 89* 90*  CO2 30   < > 35*   < > 40* 37* 37*  GLUCOSE 84   < > 75   < > 128* 102* 87  BUN 22   < > 14   < > 18 26* 26*  CREATININE 1.64*   < > 1.36*   < > 1.95* 2.79* 1.90*  CALCIUM 8.7*   < > 8.7*   < > 8.7* 9.2 8.7*  PROT 7.6  --  7.1  --  6.5  --   --   ALBUMIN 3.7   < > 3.4*  --  3.1*  --  2.9*  AST 71*  --  67*  --  69*  --   --   ALT 84*  --  76*  --  63*  --   --   ALKPHOS 71  --  70  --  74  --   --   BILITOT 1.5*  --  3.3*  --  1.6*  --   --   GFRNONAA 43*   < > 54*   < > 35* 23* 36*  GFRAA 50*   < > >60   < > 41* 26* 42*  ANIONGAP 11   < > 12   < > 10 13 11    < > = values in this interval not displayed.     Hematology Recent Labs  Lab 09/30/19 0459 10/01/19 0424 10/02/19 0359  WBC 5.3 6.3 5.7  RBC 3.96* 4.32 4.03*  HGB 13.3 14.2 13.4  HCT 39.4 41.8 38.7*  MCV 99.5 96.8 96.0  MCH 33.6 32.9 33.3  MCHC 33.8 34.0 34.6  RDW 16.7* 16.2* 15.9*  PLT 119* 122* 116*    Cardiac EnzymesNo results for input(s): TROPONINI in the last 168 hours. No results for input(s): TROPIPOC in the last 168 hours.   BNP Recent Labs  Lab 09/27/19 1912  BNP 604.8*     DDimer  Recent Labs  Lab 09/27/19 2017  DDIMER 1.17*     Radiology    DG Chest 2 View  Result Date: 10/01/2019 CLINICAL DATA:  Dyspnea EXAM: CHEST - 2 VIEW COMPARISON:  09/27/2019 FINDINGS: Cardiac shadow is mildly enlarged but stable. The lungs are well aerated bilaterally.  No focal infiltrate or sizable effusion is seen. No acute bony abnormality is noted. IMPRESSION: No active cardiopulmonary disease. Electronically Signed   By: 11/27/2019 M.D.   On: 10/01/2019 09:39   12/01/2019 RENAL  Result Date: 10/01/2019 CLINICAL DATA:  Acute kidney injury. EXAM: RENAL / URINARY TRACT ULTRASOUND COMPLETE COMPARISON:  None only the upper portion of the kidneys are imaged on a CT a of the chest. FINDINGS: Right Kidney: Renal measurements: 10.0 x 3.9 x 4.2 cm = volume: 86 mL. Poor corticomedullary differentiation. No sign of hydronephrosis. Limited assessment perhaps due to body wall edema. Left Kidney: Renal measurements: 8.7 x 4.4 x 3.2 cm = volume: 64 ML. Poor corticomedullary differentiation. Limited assessment of the left kidney secondary to overlying bowel. No gross hydronephrosis. Bladder: Appears normal for degree of bladder distention. Other: None. IMPRESSION: Findings could be seen in the setting of medical renal disease with poor corticomedullary differentiation. No hydronephrosis. Study limited by body wall edema and overlying bowel gas. Electronically Signed   By: 12/01/2019 M.D.   On: 10/01/2019 20:52    Cardiac Studies   2D echo IMPRESSIONS   1. Left ventricular ejection fraction, by estimation, is 55 to 60%. The  left ventricle has normal function. The left ventricle has no regional  wall motion abnormalities. There is mild concentric left ventricular  hypertrophy. Left ventricular diastolic  parameters are consistent with Grade I diastolic dysfunction (impaired  relaxation).  2. Right ventricular systolic function is moderately reduced. The right  ventricular size is moderately enlarged. There is moderately elevated  pulmonary artery systolic pressure. The estimated right ventricular  systolic pressure is 54.7 mmHg.  3. Right atrial size was moderately dilated.  4. The mitral valve is normal in structure. No evidence of mitral valve  regurgitation.  5.  The aortic valve is tricuspid. Aortic  valve regurgitation is moderate.  No aortic stenosis is present.  6. The inferior vena cava is dilated in size with <50% respiratory  variability, suggesting right atrial pressure of 15 mmHg.   Cardiac Cath 09/2019 Conclusion    Hemodynamic findings consistent with moderate pulmonary hypertension.   Moderately elevated right heart pressures with PA pressure 63/28 and mean pressure 39 mmHg consistent with at least moderate pulmonary hypertension.  PVR: 2.9 WU  LVEDP 17 - 18 mmHg.  Normal epicardial coronary arteries.  RECOMMENDATION: Optimization of fluid status, blood pressure, and cardiac rhythm in this patient diastolic dysfunction who presented with SVT/atrial flutter.  Smoking cessation is essential.     Patient Profile     64 y.o. male  with a hx of HTN, tobacco and ETOH (1 pint Vodka daliy) abuse and fm hx of CAD with his father having an MI in his late 64's.  Admitted with 1 week hx of DOE and LE edema, Orthopnea and PND and found to be in acute CHF and atrial flutter with RVR.  Assessment & Plan    Acute diastolic heart failure Presented with a couple days of worsening sob and LLE. BNP elevated to 604. HS troponin mildly elevated. Albumin 3.4. CXR clear. CTA chest negative for PE.  - Echo showed EF 55-60%, no WMA, G1DD, moderately reduced RV function, mild LVH, mod AI - started on IV lasix 40 mg BID - diuretics held due to increasing creatinine - creatinine improved this am (1.64>1.36>1.39>1.95>2.79 >1.9) - he put out 475cc yesteday ? Accuracy and is net neg 3.6L - continue to hold Lasix  - Increase Toprol XL to 50mg  daily as BP inadequately controlled - follow I&O's, daily weights and renal function - Etiology of right heart failure and pulmonary HTN likely related to severe COPD noted on PFTs - Chest CTA neg for acute PE.  RA also enlarged.   - He has moderate pulmonary HTN and with smoking hx, likely has COPD as at least  part of the etiology of his elevated PAP and likely a component of pulmonary venous HTN from diastolic CHF based on RHC showing elevated LVEDP - PVR borderline at 2.9WU - PFTs prelim report shows severe COPD  SVT/atrial flutter with RVR - on admission noted to be in SVT broke with adenosine and subsequent rhythm thought to be atrial flutter - maintaining NSR on tele - TSH 0.914 - K+ 4.6, Mag 2.1 - CHADS2VASC score is 2 - continue Eliquis 5mg  BID today - continue on BB  Elevated Troponin - HS troponin mildly elevated at 70>65 - Patient has RF for CAD including HTN, ETOH, tobacco use, family history - likely related to CHF as normal coronary arteries on cath - continue ASA  HTN - not on meds at baseline - BP remains elevated this am at controlled today at 173/55mmHg - increase Toprol XL to 50mg  daily   HLD - started on lipitor - LDL 71  ETOH dependence - chronic>>plans to stop - CIWA - per IM  Tobacco dependence - patient plans to stop - PFTs c/w severe COPD - O2 Sats now 97% on RA - per TRH  AKI -likely related to cath but improving with holding diuretics and gentle hydration (1.64>1.36>1.39>1.95>2.79>1.90) -appreciate renal input  I have spent a total of 35 minutes with patient reviewing hospital notes, 2D echo , telemetry, EKGs, labs and examining patient as well as establishing an assessment and plan that was discussed with the patient.  > 50% of time was  spent in direct patient care.       For questions or updates, please contact [facility] Please consult [email or web address] for contact info under Cardiology/STEMI.      Signed, Armanda Magic, MD  10/02/2019, 7:54 AM

## 2019-10-02 NOTE — Progress Notes (Signed)
SATURATION QUALIFICATIONS: (This note is used to comply with regulatory documentation for home oxygen)  Patient Saturations on Room Air at Rest = 91%  Patient Saturations on Room Air while Ambulating = 80%  Patient Saturations on 3 Liters of oxygen while Ambulating = 90%  Please briefly explain why patient needs home oxygen: Patients oxygen saturation dropped to 80% rapidly, recovery time with purse lip breathing and nasal canula at 3L took a few minutes to recover.  Upon sitting down, patient was noted to sit in a tripod position. Patient stated having trouble catching breath.  Patient is currently sitting in bed at 3L nasal canula with oxygen saturation at 94%. Patient stated breathing better.

## 2019-10-02 NOTE — Progress Notes (Addendum)
[facility] NEPHROLOGY PROGRESS NOTE  Assessment/ Plan: Pt is a 64 y.o. yo male  with history of HTN, tobacco and alcohol abuse, presented on 4/5 for worsening lower extremity edema and shortness of breath due to CHF exacerbation, seen  for acute kidney injury.  #Acute kidney injury, nonoliguric: Likely due to contrast nephropathy in addition to hemodynamic change related with IV diuretics.  Patient had CT angio on 4/5 and cardiac cath on 4/7 in association with IV lasix.  UA cloudy with few bacteria, WBC and protein. US renal ruled out obstruction. With gentle IV hydration for 15 hours the serum creatinine level improved.  Discontinue IVF.  Expect renal recovery to his baseline. Repeat renal panel tomorrow.  If creatinine level remains stable or improve then I will sign off.    #New onset acute diastolic CHF: EF 55 to 60%.  Received IV diuretics which which was held because of AKI.  Euvolemic on exam. Cardiology is following.  #SVT/atrial flutter with RVR: Starting Eliquis and beta-blocker.  #Hypertension: Blood pressure variable.    Metoprolol dose increased.    Volume status acceptable.  #Patient may have undiagnosed underlying COPD given active smoking and wheezing on lung exam.  Defer to primary team.  Please call back with question.  Subjective: Seen and examined at bedside.  Urine output recorded 550 cc, unknown if it was recorded accurately.  Creatinine level trending down to 1.9.  Denies nausea vomiting chest pain shortness breath. Objective Vital signs in last 24 hours: Vitals:   10/02/19 0036 10/02/19 0532 10/02/19 0533 10/02/19 0809  BP: 126/83  (!) 173/95 (!) 159/91  Pulse: 87  83 91  Resp: (!) 22  20 20   Temp: 98.1 F (36.7 C)  (!) 97.5 F (36.4 C) 98.1 F (36.7 C)  TempSrc: Oral  Oral Oral  SpO2: 94%  97% 94%  Weight:  74.6 kg    Height:       Weight change: 1.542 kg  Intake/Output Summary (Last 24 hours) at 10/02/2019 0941 Last data filed at  10/02/2019 0900 Gross per 24 hour  Intake 2353.63 ml  Output 475 ml  Net 1878.63 ml       Labs: Basic Metabolic Panel: Recent Labs  Lab 09/28/19 1104 09/29/19 0001 09/30/19 0459 10/01/19 0424 10/02/19 0359  NA 142   < > 140 139 138  K 4.0   < > 3.8 4.6 4.1  CL 95*   < > 90* 89* 90*  CO2 35*   < > 40* 37* 37*  GLUCOSE 75   < > 128* 102* 87  BUN 14   < > 18 26* 26*  CREATININE 1.36*   < > 1.95* 2.79* 1.90*  CALCIUM 8.7*   < > 8.7* 9.2 8.7*  PHOS 3.5  --   --   --  4.1   < > = values in this interval not displayed.   Liver Function Tests: Recent Labs  Lab 09/27/19 1912 09/27/19 1912 09/28/19 1104 09/30/19 0459 10/02/19 0359  AST 71*  --  67* 69*  --   ALT 84*  --  76* 63*  --   ALKPHOS 71  --  70 74  --   BILITOT 1.5*  --  3.3* 1.6*  --   PROT 7.6  --  7.1 6.5  --   ALBUMIN 3.7   < > 3.4* 3.1* 2.9*   < > = values in this interval not displayed.   No results for input(s): LIPASE,  AMYLASE in the last 168 hours. No results for input(s): AMMONIA in the last 168 hours. CBC: Recent Labs  Lab 09/27/19 1912 09/27/19 1912 09/28/19 1104 09/28/19 1104 09/29/19 0001 09/29/19 1559 09/30/19 0459 10/01/19 0424 10/02/19 0359  WBC 6.0   < > 4.7   < > 5.4  --  5.3 6.3 5.7  NEUTROABS 2.5  --   --   --  3.0  --   --   --   --   HGB 14.1   < > 13.6   < > 14.0   < > 13.3 14.2 13.4  HCT 40.4   < > 39.2   < > 40.7   < > 39.4 41.8 38.7*  MCV 96.7   < > 95.6  --  98.3  --  99.5 96.8 96.0  PLT 130*   < > 125*   < > 129*  --  119* 122* 116*   < > = values in this interval not displayed.   Cardiac Enzymes: No results for input(s): CKTOTAL, CKMB, CKMBINDEX, TROPONINI in the last 168 hours. CBG: Recent Labs  Lab 09/30/19 1557 09/30/19 2115 10/01/19 0559 10/01/19 1129 10/01/19 1636  GLUCAP 82 104* 98 102* 83    Iron Studies: No results for input(s): IRON, TIBC, TRANSFERRIN, FERRITIN in the last 72 hours. Studies/Results: DG Chest 2 View  Result Date:  10/01/2019 CLINICAL DATA:  Dyspnea EXAM: CHEST - 2 VIEW COMPARISON:  09/27/2019 FINDINGS: Cardiac shadow is mildly enlarged but stable. The lungs are well aerated bilaterally. No focal infiltrate or sizable effusion is seen. No acute bony abnormality is noted. IMPRESSION: No active cardiopulmonary disease. Electronically Signed   By: Alcide Clever M.D.   On: 10/01/2019 09:39   US RENAL  Result Date: 10/01/2019 CLINICAL DATA:  Acute kidney injury. EXAM: RENAL / URINARY TRACT ULTRASOUND COMPLETE COMPARISON:  None only the upper portion of the kidneys are imaged on a CT a of the chest. FINDINGS: Right Kidney: Renal measurements: 10.0 x 3.9 x 4.2 cm = volume: 86 mL. Poor corticomedullary differentiation. No sign of hydronephrosis. Limited assessment perhaps due to body wall edema. Left Kidney: Renal measurements: 8.7 x 4.4 x 3.2 cm = volume: 64 ML. Poor corticomedullary differentiation. Limited assessment of the left kidney secondary to overlying bowel. No gross hydronephrosis. Bladder: Appears normal for degree of bladder distention. Other: None. IMPRESSION: Findings could be seen in the setting of medical renal disease with poor corticomedullary differentiation. No hydronephrosis. Study limited by body wall edema and overlying bowel gas. Electronically Signed   By: Donzetta Kohut M.D.   On: 10/01/2019 20:52    Medications: Infusions: . sodium chloride    . sodium chloride      Scheduled Medications: . apixaban  5 mg Oral BID  . atorvastatin  20 mg Oral q1800  . docusate sodium  100 mg Oral BID  . folic acid  1 mg Oral Daily  . magnesium oxide  400 mg Oral BID  . metoprolol succinate  50 mg Oral Daily  . multivitamin with minerals  1 tablet Oral Daily  . nicotine  14 mg Transdermal Daily  . sodium chloride flush  3 mL Intravenous Q12H  . sodium chloride flush  3 mL Intravenous Q12H  . sodium chloride flush  3 mL Intravenous Q12H  . thiamine  100 mg Oral Daily   Or  . thiamine  100 mg Intravenous  Daily    have reviewed scheduled and prn medications.  Physical  Exam: General:NAD, comfortable Heart:RRR, s1s2 nl, no rubs Lungs:clear b/l, no crackle Abdomen:soft, Non-tender, non-distended Extremities:No LE edema Neurology: Alert awake, following commands.   Janylah Belgrave Prasad Kylina Vultaggio 10/02/2019,9:41 AM  LOS: 5 days  Pager: [phone]

## 2019-10-02 NOTE — Progress Notes (Addendum)
PROGRESS NOTE   Aaron Fuller  IEP:[id number]    DOB: 05-Sep-1955    DOA: 09/27/2019  PCP: Patient, No Pcp Per   I have briefly reviewed patients previous medical records in [facility].  Chief Complaint:   Chief Complaint  Patient presents with  . Leg Swelling  . Tachycardia    Brief Narrative:  64 year old male, lives with family, independent, not on home oxygen, PMH of HTN, alcohol dependence, tobacco abuse, admitted to [facility] via [facility] with complaints of dyspnea, orthopnea, PND and bilateral lower extremity edema.  In the ED noted to be in SVT in the 170s, hypertensive, given adenosine 6 mg, rhythm reviewed by cardiology and felt to be possible atrial flutter, Cardizem infusion started, converted to sinus rhythm.  Also hypoxic at 88% on room air.  CTA chest negative for PE.  Admitted for new onset acute diastolic CHF, SVT/atrial flutter with RVR, elevated troponin, acute respiratory failure with hypoxia.  Diuresed with IV Lasix.  S/p cardiac cath 4/7.  Course complicated by AKI, creatinine up to 2.79 on 4/8 and nephrology consulted..   Assessment & Plan:  Principal Problem:   New onset of congestive heart failure (HCC) Active Problems:   Atrial tachycardia (HCC)   Alcohol dependence (HCC)   Hypertension   Tobacco dependence   Tachycardia   Right ventricular dysfunction   Pulmonary hypertension, unspecified (HCC)   Acute diastolic heart failure (HCC)   Atrial flutter with rapid ventricular response ([place])   New onset acute diastolic CHF: BNP 660. HS troponin mildly elevated.  CTA chest negative for PE.  TTE showed LVEF 63-01%, grade 1 diastolic dysfunction and moderately reduced RV function.  Started on IV Lasix 40 mg twice daily.  -5.133 L thus far, may not be fully accurate. Cardiac cath on 4/7 showed elevated filling pressures, pulmonary hypertension and normal coronary arteries.  Initially treated with IV Lasix and diuresed aggressively.  Weight down by 17 pounds since admission.   Due to progressive acute kidney injury, IV Lasix discontinued, last dose 4/7.  Briefly hydrated with IV fluids for AKI.  Volume status remained stable.  Pulmonary hypertension: RHC showed elevated LVEDP.  As per cardiology, primary PFT report suggest severe COPD   Elevated troponin: Suspected due to demand ischemia in the setting of atrial flutter with RVR, acute CHF and hypoxia.  Cardiac cath 4/7 showed normal coronaries.  SVT/atrial flutter with RVR: Presented with SVT which broke with adenosine and subsequent rhythm thought to be atrial flutter.  Started on IV diltiazem-now discontinued and on metoprolol p.o.  Converted to sinus rhythm.  TSH 0.914.  IV heparin changed to Eliquis by cardiology. CHADS2VASC score is 2.  Cardiology change beta-blockers to Toprol-XL due to concern for COPD and Toprol-XL dose increased today to 50 mg daily.  Acute respiratory failure with hypoxia: Due to decompensated CHF complicating underlying COPD.  Oxygen saturation again at 89% this morning.  Chest x-ray without acute cardiopulmonary disease.  Patient hypoxic with activity.  Likely to need home oxygen at discharge.  Essential hypertension: Beta-blockers changed to Toprol-XL and dose increased today to 50 mg daily due to uncontrolled HTN.  Hyperlipidemia: LDL 71.  Continue atorvastatin.  Alcohol dependence: Abstinence counseled.  Has not had overt withdrawal in the hospital fortunately.  Tobacco abuse: Cessation counseled.  Continue nicotine patch.  COPD: Tobacco cessation counseled.  As needed bronchodilator nebulizations.  As per cardiology, PFTs show severe COPD.  Thrombocytopenia: Likely related to alcohol abuse.  Stable.  Follow daily CBC.  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  Outpatient follow-up.  Body mass index is 24.29 kg/m.   Acute kidney injury: Patient presented with creatinine of 1.64 which had improved to 1.39 but has has progressively increased to 2.7 on 4/8.  Multifactorial due to  recent aggressive IV Lasix diuresis, contrast nephropathy from recent cardiac cath and CTA chest and hemodynamics.  Nephrology input appreciated.  Holding diuretics.  Briefly hydrated with IV fluids and creatinine has improved from 2.7 to 1.9.  IV fluids stopped.  Follow BMP in a.m.  Hypomagnesemia: 0.8 > 1.3.  Replace aggressively/2.1 today.  Abnormal LFTs: Likely combination of alcohol dependence and hepatic passive congestion from CHF.  Stable.  Improving.  Asymptomatic bacteriuria: Urine culture shows >100 K Aerococcus.   DVT prophylaxis: IV heparin drip Code Status: Full Family Communication: None at bedside.  Discussed in detail with patient spouse via patient's speaker phone. Disposition:  . Patient came from: Home           . Anticipated d/c place: Home . Barriers to d/c: Progressively worsening acute kidney injury, now improving.  Hopeful DC home in the next 1 to 2 days pending cardiology and nephrology clearance.   Consultants:   Cardiology Nephrology  Procedures:   None  Antimicrobials:   None   Subjective:  Denies complaints.  No dyspnea or pain reported.  Objective:   Vitals:   10/02/19 0532 10/02/19 0533 10/02/19 0809 10/02/19 1128  BP:  (!) 173/95 (!) 159/91 (!) 161/101  Pulse:  83 91 80  Resp:  20 20 18   Temp:  (!) 97.5 F (36.4 C) 98.1 F (36.7 C) 98.9 F (37.2 C)  TempSrc:  Oral Oral Oral  SpO2:  97% 94% 100%  Weight: 74.6 kg     Height:        General exam: Middle-age male, moderately built and nourished lying propped up in bed without distress Respiratory system: Distant breath sounds with occasional rhonchi, may have chronic intermittent rhonchi per patient.  No increased work of breathing. Cardiovascular system: S1 and S2 heard, RRR.  No JVD, murmurs or pedal edema.  Telemetry personally reviewed: Sinus rhythm. Gastrointestinal system: Abdomen is nondistended, soft and nontender. No organomegaly or masses felt. Normal bowel sounds  heard. Central nervous system: Alert and oriented. No focal neurological deficits. Extremities: Symmetric 5 x 5 power. Skin: No rashes, lesions or ulcers Psychiatry: Judgement and insight appear normal. Mood & affect appropriate.     Data Reviewed:   I have personally reviewed following labs and imaging studies   CBC: Recent Labs  Lab 09/27/19 1912 09/28/19 1104 09/29/19 0001 09/29/19 1559 09/30/19 0459 10/01/19 0424 10/02/19 0359  WBC 6.0   < > 5.4  --  5.3 6.3 5.7  NEUTROABS 2.5  --  3.0  --   --   --   --   HGB 14.1   < > 14.0   < > 13.3 14.2 13.4  HCT 40.4   < > 40.7   < > 39.4 41.8 38.7*  MCV 96.7   < > 98.3  --  99.5 96.8 96.0  PLT 130*   < > 129*  --  119* 122* 116*   < > = values in this interval not displayed.    Basic Metabolic Panel: Recent Labs  Lab 09/28/19 1104 09/28/19 1104 09/29/19 0001 09/29/19 1559 09/30/19 0459 10/01/19 0424 10/02/19 0359  NA 142   < > 142   < > 140 139 138  K 4.0   < >  3.7   < > 3.8 4.6 4.1  CL 95*   < > 93*  --  90* 89* 90*  CO2 35*   < > 39*  --  40* 37* 37*  GLUCOSE 75   < > 150*  --  128* 102* 87  BUN 14   < > 17  --  18 26* 26*  CREATININE 1.36*   < > 1.39*  --  1.95* 2.79* 1.90*  CALCIUM 8.7*   < > 8.8*  --  8.7* 9.2 8.7*  MG 0.8*  --  1.3*  --  2.1  --   --   PHOS 3.5  --   --   --   --   --  4.1   < > = values in this interval not displayed.    Liver Function Tests: Recent Labs  Lab 09/27/19 1912 09/27/19 1912 09/28/19 1104 09/30/19 0459 10/02/19 0359  AST 71*  --  67* 69*  --   ALT 84*  --  76* 63*  --   ALKPHOS 71  --  70 74  --   BILITOT 1.5*  --  3.3* 1.6*  --   PROT 7.6  --  7.1 6.5  --   ALBUMIN 3.7   < > 3.4* 3.1* 2.9*   < > = values in this interval not displayed.    CBG: Recent Labs  Lab 10/01/19 0559 10/01/19 1129 10/01/19 1636  GLUCAP 98 102* 83    Microbiology Studies:   Recent Results (from the past 240 hour(s))  SARS Coronavirus 2 Ag (30 min TAT) - Nasal Swab (BD Veritor Kit)      Status: None   Collection Time: 09/27/19  8:16 PM   Specimen: Nasal Swab (BD Veritor Kit)  Result Value Ref Range Status   SARS Coronavirus 2 Ag NEGATIVE NEGATIVE Final    Comment: (NOTE) SARS-CoV-2 antigen NOT DETECTED.  Negative results are presumptive.  Negative results do not preclude SARS-CoV-2 infection and should not be used as the sole basis for treatment or other patient management decisions, including infection  control decisions, particularly in the presence of clinical signs and  symptoms consistent with COVID-19, or in those who have been in contact with the virus.  Negative results must be combined with clinical observations, patient history, and epidemiological information. The expected result is Negative. Fact Sheet for Patients: PodPark.tn Fact Sheet for Healthcare Providers: GiftContent.is This test is not yet approved or cleared by the [place] FDA and  has been authorized for detection and/or diagnosis of SARS-CoV-2 by FDA under an Emergency Use Authorization (EUA).  This EUA will remain in effect (meaning this test can be used) for the duration of  the COVID-19 de claration under Section 564(b)(1) of the Act, 21 U.S.C. section 360bbb-3(b)(1), unless the authorization is terminated or revoked sooner. Performed at [facility], [address] [place], [place] [address]   SARS CORONAVIRUS 2 (TAT 6-24 HRS) Nasopharyngeal Nasopharyngeal Swab     Status: None   Collection Time: 09/27/19  8:17 PM   Specimen: Nasopharyngeal Swab  Result Value Ref Range Status   SARS Coronavirus 2 NEGATIVE NEGATIVE Final    Comment: (NOTE) SARS-CoV-2 target nucleic acids are NOT DETECTED. The SARS-CoV-2 RNA is generally detectable in upper and lower respiratory specimens during the acute phase of infection. Negative results do not preclude SARS-CoV-2 infection, do not rule out co-infections with other  pathogens, and should not be used as the  sole basis for treatment or other patient management decisions. Negative results must be combined with clinical observations, patient history, and epidemiological information. The expected result is Negative. Fact Sheet for Patients: SugarRoll.be Fact Sheet for Healthcare Providers: https://www.woods-mathews.com/ This test is not yet approved or cleared by the [place] FDA and  has been authorized for detection and/or diagnosis of SARS-CoV-2 by FDA under an Emergency Use Authorization (EUA). This EUA will remain  in effect (meaning this test can be used) for the duration of the COVID-19 declaration under Section 56 4(b)(1) of the Act, 21 U.S.C. section 360bbb-3(b)(1), unless the authorization is terminated or revoked sooner. Performed at [facility], [place] [address], [place], [place] [address]   Urine culture     Status: Abnormal   Collection Time: 09/27/19  9:59 PM   Specimen: Urine, Random  Result Value Ref Range Status   Specimen Description   Final    URINE, RANDOM Performed at [facility], [address] [place], [place] [address]    Special Requests   Final    NONE Performed at [facility], [address] [place], [place] [address]    Culture (A)  Final    >=100,000 COLONIES/mL AEROCOCCUS SPECIES Standardized susceptibility testing for this organism is not available. Performed at [facility], [place] [address], [place], [place] [address]    Report Status 09/30/2019 FINAL  Final     Radiology Studies:  US RENAL  Result Date: 10-22-2019 CLINICAL DATA:  Acute kidney injury. EXAM: RENAL / URINARY TRACT ULTRASOUND COMPLETE COMPARISON:  None only the upper portion of the kidneys are imaged on a CT a of the chest. FINDINGS: Right Kidney: Renal measurements: 10.0 x 3.9 x 4.2 cm = volume: 86 mL. Poor corticomedullary differentiation. No sign of  hydronephrosis. Limited assessment perhaps due to body wall edema. Left Kidney: Renal measurements: 8.7 x 4.4 x 3.2 cm = volume: 64 ML. Poor corticomedullary differentiation. Limited assessment of the left kidney secondary to overlying bowel. No gross hydronephrosis. Bladder: Appears normal for degree of bladder distention. Other: None. IMPRESSION: Findings could be seen in the setting of medical renal disease with poor corticomedullary differentiation. No hydronephrosis. Study limited by body wall edema and overlying bowel gas. Electronically Signed   By: Zetta Bills M.D.   On: 10-22-2019 20:52     Scheduled Meds:   . apixaban  5 mg Oral BID  . atorvastatin  20 mg Oral q1800  . docusate sodium  100 mg Oral BID  . folic acid  1 mg Oral Daily  . magnesium oxide  400 mg Oral BID  . metoprolol succinate  50 mg Oral Daily  . multivitamin with minerals  1 tablet Oral Daily  . nicotine  14 mg Transdermal Daily  . sodium chloride flush  3 mL Intravenous Q12H  . sodium chloride flush  3 mL Intravenous Q12H  . sodium chloride flush  3 mL Intravenous Q12H  . thiamine  100 mg Oral Daily   Or  . thiamine  100 mg Intravenous Daily    Continuous Infusions:   . sodium chloride    . sodium chloride       LOS: 5 days     Vernell Leep, MD, [place], [facility]. [facility]    To contact the attending provider between 7A-7P or the covering provider during after hours 7P-7A, please log into the web site [email or web address] and access using universal [place] password  for that web site. If you do not have the password, please call the hospital operator.  10/02/2019, 4:09 PM

## 2019-10-03 ENCOUNTER — Other Ambulatory Visit: Payer: Self-pay | Admitting: Physician Assistant

## 2019-10-03 DIAGNOSIS — F17200 Nicotine dependence, unspecified, uncomplicated: Secondary | ICD-10-CM

## 2019-10-03 DIAGNOSIS — I5032 Chronic diastolic (congestive) heart failure: Secondary | ICD-10-CM

## 2019-10-03 LAB — RENAL FUNCTION PANEL
Albumin: 2.8 g/dL — ABNORMAL LOW (ref 3.5–5.0)
Anion gap: 11 (ref 5–15)
BUN: 25 mg/dL — ABNORMAL HIGH (ref 8–23)
CO2: 36 mmol/L — ABNORMAL HIGH (ref 22–32)
Calcium: 8.8 mg/dL — ABNORMAL LOW (ref 8.9–10.3)
Chloride: 91 mmol/L — ABNORMAL LOW (ref 98–111)
Creatinine, Ser: 1.5 mg/dL — ABNORMAL HIGH (ref 0.61–1.24)
GFR calc Af Amer: 56 mL/min — ABNORMAL LOW (ref >60)
GFR calc non Af Amer: 48 mL/min — ABNORMAL LOW (ref >60)
Glucose, Bld: 87 mg/dL (ref 70–99)
Phosphorus: 4.7 mg/dL — ABNORMAL HIGH (ref 2.5–4.6)
Potassium: 4.3 mmol/L (ref 3.5–5.1)
Sodium: 138 mmol/L (ref 135–145)

## 2019-10-03 LAB — GLUCOSE, CAPILLARY: Glucose-Capillary: 87 mg/dL (ref 70–99)

## 2019-10-03 MED ORDER — ALBUTEROL SULFATE HFA 108 (90 BASE) MCG/ACT IN AERS: 2.0000 | INHALATION_SPRAY | Freq: Four times a day (QID) | RESPIRATORY_TRACT | 0 refills | Status: DC | PRN

## 2019-10-03 MED ORDER — FOLIC ACID 1 MG PO TABS: 1.0000 mg | ORAL_TABLET | Freq: Every day | ORAL | 0 refills | Status: AC

## 2019-10-03 MED ORDER — APIXABAN 5 MG PO TABS: 5.0000 mg | ORAL_TABLET | Freq: Two times a day (BID) | ORAL | 0 refills | Status: DC

## 2019-10-03 MED ORDER — ATORVASTATIN CALCIUM 20 MG PO TABS: 20.0000 mg | ORAL_TABLET | Freq: Every day | ORAL | 0 refills | Status: DC

## 2019-10-03 MED ORDER — ADULT MULTIVITAMIN W/MINERALS CH: 1.0000 | ORAL_TABLET | Freq: Every day | ORAL | Status: AC

## 2019-10-03 MED ORDER — METOPROLOL SUCCINATE ER 50 MG PO TB24: 50.0000 mg | ORAL_TABLET | Freq: Every day | ORAL | 0 refills | Status: DC

## 2019-10-03 MED ORDER — THIAMINE HCL 100 MG PO TABS: 100.0000 mg | ORAL_TABLET | Freq: Every day | ORAL | 0 refills | Status: AC

## 2019-10-03 MED ORDER — NICOTINE 14 MG/24HR TD PT24: 14.0000 mg | MEDICATED_PATCH | Freq: Every day | TRANSDERMAL | 0 refills | Status: DC

## 2019-10-03 NOTE — Discharge Summary (Signed)
Physician Discharge Summary  Aaron Fuller EPP:[id number] DOB: 1956/01/16  PCP: Patient, No Pcp Per  Admitted from: Home Discharged to: Home  Admit date: 09/27/2019 Discharge date: 10/03/2019  Recommendations for Outpatient Follow-up:   Follow-up Information    Sueanne Margarita, MD Follow up.   Specialty: Cardiology Why: The office will call you to make an apt to be seen and for labs. Contact information: [address] [place] [address] [phone]        [facility]. Go on 10/06/2019.   Why: Labs (CBC & CMP) at 2:30pm Contact information: [place] [address] [place], [facility] Follow up.   Why: for home health  Contact information: [place] [address] [phone]        [facility] Follow up.   Why: for home oxygen services [phone] Contact information: [place] [address] [phone]            Home Health: PT and CSW Equipment/Devices: Oxygen via nasal cannula at 2 L/min continuously.  Discharge Condition: Improved and stable. CODE STATUS: Full Diet recommendation: Heart healthy diet  Discharge Diagnoses:  Principal Problem:   New onset of congestive heart failure (HCC) Active Problems:   Atrial tachycardia (HCC)   Alcohol dependence (HCC)   Hypertension   Tobacco dependence   Tachycardia   Right ventricular dysfunction   Pulmonary hypertension, unspecified (HCC)   Acute diastolic heart failure (HCC)   Atrial flutter with rapid ventricular response (HCC)   Brief Summary: 64 year old male, lives with family, independent, not on home oxygen, PMH of HTN, alcohol dependence, tobacco abuse, admitted to [facility] via [facility] with complaints of dyspnea, orthopnea, PND and bilateral lower extremity edema.  In the ED noted to be in SVT in the 170s,  hypertensive, given adenosine 6 mg, rhythm reviewed by Cardiology and felt to be possible atrial flutter, Cardizem infusion started, converted to sinus rhythm.  Also hypoxic at 88% on room air.  CTA chest negative for PE.  Admitted for new onset acute diastolic CHF, SVT/atrial flutter with RVR, elevated troponin, acute respiratory failure with hypoxia.  Diuresed with IV Lasix.  S/p cardiac cath 4/7.  Course complicated by AKI, creatinine up to 2.79 on 4/8 and nephrology consulted.   Assessment & Plan:   New onset acute diastolic CHF: BNP 626. HS troponin mildly elevated.  CTA chest negative for PE.  TTE showed LVEF 94-85%, grade 1 diastolic dysfunction and moderately reduced RV function. Cardiac cath on 4/7 showed elevated filling pressures, pulmonary hypertension and normal coronary arteries.  Initially treated with IV Lasix and diuresed aggressively. Weight down by 17 pounds since admission. Due to progressive acute kidney injury, IV Lasix discontinued, last dose 4/7.  Briefly hydrated with IV fluids for AKI which is progressively improved.  Clinically euvolemic.  Cardiology follow-up appreciated, starting low-dose Lasix 20 mg daily at discharge with close outpatient follow-up with them with repeat labs next week.  Patient counseled extensively regarding importance of compliance with all aspects of his care and he verbalized understanding.  Pulmonary hypertension: RHC showed elevated LVEDP.  As per cardiology, primary PFT report suggest severe COPD.  Outpatient follow-up and may consider Pulmonology consultation.  May use albuterol HFA as needed for dyspnea or wheezing.  Consider Advair or Dulera during outpatient follow-up.   Elevated troponin: Suspected due to demand ischemia  in the setting of atrial flutter with RVR, acute CHF and hypoxia.  Cardiac cath 4/7 showed normal coronaries.  SVT/atrial flutter with RVR: Presented with SVT which broke with adenosine and subsequent rhythm thought to be  atrial flutter.  Started on IV diltiazem-now discontinued and on metoprolol p.o.  Converted to sinus rhythm.  TSH 0.914.  IV heparin changed to Eliquis by Cardiology. CHADS2VASC score is 2.  Cardiology changed beta-blockers to Toprol-XL due to concern for COPD and Toprol-XL dose increased today to 50 mg daily.  Remains in sinus rhythm.  DC home on Toprol-XL 50 mg daily and Eliquis 5 mg twice daily.  TOC team consulted for Eliquis resources (may get 30-day free card) and were checking benefits.  Acute respiratory failure with hypoxia: Due to decompensated CHF complicating underlying COPD. Chest x-ray without acute cardiopulmonary disease.  Patient hypoxic with activity on day of discharge and qualifies for home oxygen, arranged through [facility] team.  Essential hypertension: Beta-blockers changed to Toprol-XL and dose increased today to 50 mg daily due to uncontrolled HTN.  BP is better controlled.  Hyperlipidemia: LDL 71.  Continue atorvastatin.  Alcohol dependence: Abstinence counseled.  Has not had overt withdrawal in the hospital fortunately.  Continue thiamine, folate and multivitamins.  Tobacco abuse: Cessation counseled.  Continue nicotine patch.  COPD: Tobacco cessation counseled.  As needed albuterol inhaler at discharge.  As per cardiology, PFTs show severe COPD.  No clinical bronchospasm.  Thrombocytopenia: Likely related to alcohol abuse.  Stable.  Follow CBCs closely as outpatient.  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  Outpatient follow-up.  Requested TOC team to arrange new PCP at discharge.  Body mass index is 24.29 kg/m.   Acute kidney injury: Patient presented with creatinine of 1.64 which had improved to 1.39 but has has progressively increased to 2.7 on 4/8.  Multifactorial due to recent aggressive IV Lasix diuresis, contrast nephropathy from recent cardiac cath and CTA chest and hemodynamics.  Nephrology input appreciated. Briefly held diuretics.  Renal ultrasound  without hydronephrosis.  Briefly hydrated with IV fluids and creatinine has improved from 2.7 >1 0.9 > 1.5.  IV fluids stopped. No baseline creatinine to compare.  He may have an element of CKD, unknown stage.  Follow BMP closely as outpatient.  Hypomagnesemia: 0.8 > 1.3.  Replace aggressively/2.1.  Abnormal LFTs: Likely combination of alcohol dependence and hepatic passive congestion from CHF.  Stable.  Improving.  Outpatient follow-up.  Asymptomatic bacteriuria: Urine culture shows >100 K Aerococcus.  No antibiotics initiated    Consultants:   Cardiology Nephrology  Procedures:   None   Discharge Instructions  Discharge Instructions    (HEART FAILURE PATIENTS) Call MD:  Anytime you have any of the following symptoms: 1) 3 pound weight gain in 24 hours or 5 pounds in 1 week 2) shortness of breath, with or without a dry hacking cough 3) swelling in the hands, feet or stomach 4) if you have to sleep on extra pillows at night in order to breathe.   Complete by: As directed    Call MD for:  difficulty breathing, headache or visual disturbances   Complete by: As directed    Call MD for:  extreme fatigue   Complete by: As directed    Call MD for:  persistant dizziness or light-headedness   Complete by: As directed    Diet - low sodium heart healthy   Complete by: As directed    Increase activity slowly   Complete by: As directed  Medication List    TAKE these medications   albuterol 108 (90 Base) MCG/ACT inhaler Commonly known as: VENTOLIN HFA Inhale 2 puffs into the lungs every 6 (six) hours as needed for wheezing or shortness of breath.   apixaban 5 MG Tabs tablet Commonly known as: ELIQUIS Take 1 tablet (5 mg total) by mouth 2 (two) times daily.   atorvastatin 20 MG tablet Commonly known as: LIPITOR Take 1 tablet (20 mg total) by mouth daily at 6 PM.   diphenhydrAMINE 25 mg capsule Commonly known as: BENADRYL Take 25 mg by mouth every 6 (six) hours as  needed (for grass/pollen-induced allergic symptoms).   folic acid 1 MG tablet Commonly known as: FOLVITE Take 1 tablet (1 mg total) by mouth daily. Start taking on: October 04, 2019   metoprolol succinate 50 MG 24 hr tablet Commonly known as: TOPROL-XL Take 1 tablet (50 mg total) by mouth daily. Take with or immediately following a meal. Start taking on: October 04, 2019   multivitamin with minerals Tabs tablet Take 1 tablet by mouth daily. Start taking on: October 04, 2019   nicotine 14 mg/24hr patch Commonly known as: NICODERM CQ - dosed in mg/24 hours Place 1 patch (14 mg total) onto the skin daily. Start taking on: October 04, 2019   thiamine 100 MG tablet Take 1 tablet (100 mg total) by mouth daily. Start taking on: October 04, 2019      Allergies  Allergen Reactions  . Latex Rash      Procedures/Studies: DG Chest 2 View  Result Date: 10/01/2019 CLINICAL DATA:  Dyspnea EXAM: CHEST - 2 VIEW COMPARISON:  09/27/2019 FINDINGS: Cardiac shadow is mildly enlarged but stable. The lungs are well aerated bilaterally. No focal infiltrate or sizable effusion is seen. No acute bony abnormality is noted. IMPRESSION: No active cardiopulmonary disease. Electronically Signed   By: Inez Catalina M.D.   On: 10/01/2019 09:39   CT Angio Chest PE W and/or Wo Contrast  Result Date: 09/27/2019 CLINICAL DATA:  Shortness of breath. Swelling of both legs and face. EXAM: CT ANGIOGRAPHY CHEST WITH CONTRAST TECHNIQUE: Multidetector CT imaging of the chest was performed using the standard protocol during bolus administration of intravenous contrast. Multiplanar CT image reconstructions and MIPs were obtained to evaluate the vascular anatomy. CONTRAST:  60m OMNIPAQUE IOHEXOL 350 MG/ML SOLN COMPARISON:  None. FINDINGS: Cardiovascular: Contrast injection is sufficient to demonstrate satisfactory opacification of the pulmonary arteries to the segmental level. There is no pulmonary embolus. The main pulmonary artery  is within normal limits for size. There is no CT evidence of acute right heart strain. There are atherosclerotic changes of the thoracic aorta without evidence for dissection or aneurysm. Heart size is mildly enlarged. There is a trace pericardial effusion. Mediastinum/Nodes: --No mediastinal or hilar lymphadenopathy. --No axillary lymphadenopathy. --No supraclavicular lymphadenopathy. --Normal thyroid gland. --The esophagus is unremarkable Lungs/Pleura: There are moderate emphysematous changes. There is no pneumothorax. No pleural effusion. Upper Abdomen: There is a 1.9 cm cyst in the left hepatic lobe. There is a moderate grade stenosis of the origin of the celiac axis with poststenotic dilatation of the mid to distal celiac axis which currently measures approximately 1.4 cm. Musculoskeletal: There is an old healed sternal body fracture. Review of the MIP images confirms the above findings. IMPRESSION: 1. No evidence for pulmonary embolus. 2. Moderate emphysematous changes in the lungs. 3. Moderate grade stenosis of the origin of the celiac axis with poststenotic dilatation of the mid to distal celiac axis  which currently measures approximately 1.4 cm. 4. Aortic Atherosclerosis (ICD10-I70.0) and Emphysema (ICD10-J43.9). Electronically Signed   By: Constance Holster M.D.   On: 09/27/2019 22:04   CARDIAC CATHETERIZATION  Result Date: 09/29/2019  Hemodynamic findings consistent with moderate pulmonary hypertension.  Moderately elevated right heart pressures with PA pressure 63/28 and mean pressure 39 mmHg consistent with at least moderate pulmonary hypertension. PVR: 2.9 WU LVEDP 17 - 18 mmHg. Normal epicardial coronary arteries. RECOMMENDATION: Optimization of fluid status, blood pressure, and cardiac rhythm in this patient diastolic dysfunction who presented with SVT/atrial flutter.  Smoking cessation is essential.   US RENAL  Result Date: 10/01/2019 CLINICAL DATA:  Acute kidney injury. EXAM: RENAL /  URINARY TRACT ULTRASOUND COMPLETE COMPARISON:  None only the upper portion of the kidneys are imaged on a CT a of the chest. FINDINGS: Right Kidney: Renal measurements: 10.0 x 3.9 x 4.2 cm = volume: 86 mL. Poor corticomedullary differentiation. No sign of hydronephrosis. Limited assessment perhaps due to body wall edema. Left Kidney: Renal measurements: 8.7 x 4.4 x 3.2 cm = volume: 64 ML. Poor corticomedullary differentiation. Limited assessment of the left kidney secondary to overlying bowel. No gross hydronephrosis. Bladder: Appears normal for degree of bladder distention. Other: None. IMPRESSION: Findings could be seen in the setting of medical renal disease with poor corticomedullary differentiation. No hydronephrosis. Study limited by body wall edema and overlying bowel gas. Electronically Signed   By: Zetta Bills M.D.   On: 10/01/2019 20:52   DG Chest Port 1 View  Result Date: 09/27/2019 CLINICAL DATA:  64 year old male with shortness of breath. EXAM: PORTABLE CHEST 1 VIEW COMPARISON:  Chest radiograph dated 11/28/2017 FINDINGS: No focal consolidation, pleural effusion, pneumothorax. There is mild cardiomegaly. Atherosclerotic calcification of the aorta. No acute osseous pathology. IMPRESSION: No active disease. Electronically Signed   By: Anner Crete M.D.   On: 09/27/2019 19:54   ECHOCARDIOGRAM COMPLETE  Result Date: 09/28/2019    ECHOCARDIOGRAM REPORT   Patient Name:   CLAUS SILVESTRO Date of Exam: 09/28/2019 Medical Rec #:  [id number]  Height:       69.0 in Accession #:    [id number] Weight:       171.5 lb Date of Birth:  05/26/1956  BSA:          1.935 m Patient Age:    64 years   BP:           152/93 mmHg Patient Gender: M          HR:           87 bpm. Exam Location:  Inpatient Procedure: 2D Echo Indications:    CHF 428.21  History:        Patient has no prior history of Echocardiogram examinations.                 Risk Factors:Hypertension and Current Smoker. Tobacco                 dependence.  ETOH Dependence.  Sonographer:    Jannett Celestine RDCS (AE) Referring Phys: [place]  1. Left ventricular ejection fraction, by estimation, is 55 to 60%. The left ventricle has normal function. The left ventricle has no regional wall motion abnormalities. There is mild concentric left ventricular hypertrophy. Left ventricular diastolic parameters are consistent with Grade I diastolic dysfunction (impaired relaxation).  2. Right ventricular systolic function is moderately reduced. The right ventricular size is moderately enlarged. There is moderately elevated pulmonary artery  systolic pressure. The estimated right ventricular systolic pressure is 67.6 mmHg.  3. Right atrial size was moderately dilated.  4. The mitral valve is normal in structure. No evidence of mitral valve regurgitation.  5. The aortic valve is tricuspid. Aortic valve regurgitation is moderate. No aortic stenosis is present.  6. The inferior vena cava is dilated in size with <50% respiratory variability, suggesting right atrial pressure of 15 mmHg. FINDINGS  Left Ventricle: Left ventricular ejection fraction, by estimation, is 55 to 60%. The left ventricle has normal function. The left ventricle has no regional wall motion abnormalities. The left ventricular internal cavity size was normal in size. There is  mild concentric left ventricular hypertrophy. Left ventricular diastolic parameters are consistent with Grade I diastolic dysfunction (impaired relaxation). Indeterminate filling pressures. Right Ventricle: The right ventricular size is moderately enlarged. No increase in right ventricular wall thickness. Right ventricular systolic function is moderately reduced. There is moderately elevated pulmonary artery systolic pressure. The tricuspid  regurgitant velocity is 3.15 m/s, and with an assumed right atrial pressure of 15 mmHg, the estimated right ventricular systolic pressure is 19.5 mmHg. Left Atrium: Left atrial size was  normal in size. Right Atrium: Right atrial size was moderately dilated. Pericardium: There is no evidence of pericardial effusion. Mitral Valve: The mitral valve is normal in structure. No evidence of mitral valve regurgitation. Tricuspid Valve: The tricuspid valve is normal in structure. Tricuspid valve regurgitation is mild. Aortic Valve: The aortic valve is tricuspid. Aortic valve regurgitation is moderate. Aortic regurgitation PHT measures 403 msec. No aortic stenosis is present. Pulmonic Valve: The pulmonic valve was not well visualized. Pulmonic valve regurgitation is not visualized. Aorta: The aortic root is normal in size and structure. Venous: The inferior vena cava is dilated in size with less than 50% respiratory variability, suggesting right atrial pressure of 15 mmHg. IAS/Shunts: No atrial level shunt detected by color flow Doppler.  LEFT VENTRICLE PLAX 2D LVIDd:         4.30 cm  Diastology LVIDs:         2.90 cm  LV e' medial:   6.85 cm/s LV PW:         1.20 cm  LV E/e' medial: 11.5 LV IVS:        1.40 cm LVOT diam:     2.00 cm LV SV:         67 LV SV Index:   35 LVOT Area:     3.14 cm  RIGHT VENTRICLE RV S prime:     8.81 cm/s TAPSE (M-mode): 1.8 cm LEFT ATRIUM           Index       RIGHT ATRIUM           Index LA diam:      3.10 cm 1.60 cm/m  RA Area:     25.50 cm LA Vol (A4C): 48.0 ml 24.80 ml/m RA Volume:   82.50 ml  42.63 ml/m  AORTIC VALVE LVOT Vmax:   114.00 cm/s LVOT Vmean:  65.050 cm/s LVOT VTI:    0.214 m AI PHT:      403 msec  AORTA Ao Root diam: 3.40 cm MITRAL VALVE               TRICUSPID VALVE MV Area (PHT): 3.60 cm    TR Peak grad:   39.7 mmHg MV Decel Time: 211 msec    TR Vmax:        315.00 cm/s MV  E velocity: 78.80 cm/s MV A velocity: 94.30 cm/s  SHUNTS MV E/A ratio:  0.84        Systemic VTI:  0.21 m                            Systemic Diam: 2.00 cm Dani Gobble Croitoru MD Electronically signed by Sanda Klein MD Signature Date/Time: 09/28/2019/4:37:35 PM    Final        Subjective: Anxious to go home.  Denies dyspnea both at rest and with activity.  States that he has been ambulating in his room without difficulty.  No chest pain, dizziness, lightheadedness or palpitations.  As per RN, no acute issues reported.  Discharge Exam:  Vitals:   10/03/19 0020 10/03/19 0504 10/03/19 0746 10/03/19 0927  BP: 110/75 125/70 137/88 137/84  Pulse: 81 80 86 83  Resp: 19 18 18    Temp: 98.8 F (37.1 C) 98.6 F (37 C) 98.2 F (36.8 C)   TempSrc: Oral Oral Oral   SpO2: 100% 98% 97%   Weight:  75.1 kg    Height:       General exam: Middle-age male, moderately built and nourished lying propped up in bed without distress Respiratory system: Distant breath sounds but clear to auscultation without wheezing, rhonchi or crackles.  Today, no pursed lip breathing or tachypnea even with minimal activity in bed.  No increased work of breathing. Cardiovascular system: S1 and S2 heard, RRR.  No JVD, murmurs or pedal edema.  Telemetry personally reviewed: Sinus rhythm. Gastrointestinal system: Abdomen is nondistended, soft and nontender. No organomegaly or masses felt. Normal bowel sounds heard. Central nervous system: Alert and oriented. No focal neurological deficits. Extremities: Symmetric 5 x 5 power. Skin: No rashes, lesions or ulcers Psychiatry: Judgement and insight appear normal. Mood & affect appropriate.     The results of significant diagnostics from this hospitalization (including imaging, microbiology, ancillary and laboratory) are listed below for reference.     Microbiology: Recent Results (from the past 240 hour(s))  SARS Coronavirus 2 Ag (30 min TAT) - Nasal Swab (BD Veritor Kit)     Status: None   Collection Time: 09/27/19  8:16 PM   Specimen: Nasal Swab (BD Veritor Kit)  Result Value Ref Range Status   SARS Coronavirus 2 Ag NEGATIVE NEGATIVE Final    Comment: (NOTE) SARS-CoV-2 antigen NOT DETECTED.  Negative results are presumptive.  Negative  results do not preclude SARS-CoV-2 infection and should not be used as the sole basis for treatment or other patient management decisions, including infection  control decisions, particularly in the presence of clinical signs and  symptoms consistent with COVID-19, or in those who have been in contact with the virus.  Negative results must be combined with clinical observations, patient history, and epidemiological information. The expected result is Negative. Fact Sheet for Patients: PodPark.tn Fact Sheet for Healthcare Providers: GiftContent.is This test is not yet approved or cleared by the [place] FDA and  has been authorized for detection and/or diagnosis of SARS-CoV-2 by FDA under an Emergency Use Authorization (EUA).  This EUA will remain in effect (meaning this test can be used) for the duration of  the COVID-19 de claration under Section 564(b)(1) of the Act, 21 U.S.C. section 360bbb-3(b)(1), unless the authorization is terminated or revoked sooner. Performed at [facility], [address] [place], [place] [address]   SARS CORONAVIRUS 2 (TAT 6-24 HRS) Nasopharyngeal Nasopharyngeal Swab  Status: None   Collection Time: 09/27/19  8:17 PM   Specimen: Nasopharyngeal Swab  Result Value Ref Range Status   SARS Coronavirus 2 NEGATIVE NEGATIVE Final    Comment: (NOTE) SARS-CoV-2 target nucleic acids are NOT DETECTED. The SARS-CoV-2 RNA is generally detectable in upper and lower respiratory specimens during the acute phase of infection. Negative results do not preclude SARS-CoV-2 infection, do not rule out co-infections with other pathogens, and should not be used as the sole basis for treatment or other patient management decisions. Negative results must be combined with clinical observations, patient history, and epidemiological information. The expected result is Negative. Fact Sheet for  Patients: SugarRoll.be Fact Sheet for Healthcare Providers: https://www.woods-mathews.com/ This test is not yet approved or cleared by the [place] FDA and  has been authorized for detection and/or diagnosis of SARS-CoV-2 by FDA under an Emergency Use Authorization (EUA). This EUA will remain  in effect (meaning this test can be used) for the duration of the COVID-19 declaration under Section 56 4(b)(1) of the Act, 21 U.S.C. section 360bbb-3(b)(1), unless the authorization is terminated or revoked sooner. Performed at [facility], [place] [address] [place], [place] [address]   Urine culture     Status: Abnormal   Collection Time: 09/27/19  9:59 PM   Specimen: Urine, Random  Result Value Ref Range Status   Specimen Description   Final    URINE, RANDOM Performed at [facility], [address] [place], [place] [address]    Special Requests   Final    NONE Performed at [facility], [address] [place], [place] [address]    Culture (A)  Final    >=100,000 COLONIES/mL AEROCOCCUS SPECIES Standardized susceptibility testing for this organism is not available. Performed at [facility], [place] [address], [place], [place] [address]    Report Status 09/30/2019 FINAL  Final     Labs: CBC: Recent Labs  Lab 09/27/19 1912 09/27/19 1912 09/28/19 1104 09/28/19 1104 09/29/19 0001 09/29/19 1559 09/29/19 1625 09/29/19 1627 09/30/19 0459 10/01/19 0424 10/02/19 0359  WBC 6.0   < > 4.7  --  5.4  --   --   --  5.3 6.3 5.7  NEUTROABS 2.5  --   --   --  3.0  --   --   --   --   --   --   HGB 14.1   < > 13.6   < > 14.0   < > 15.6 15.6 13.3 14.2 13.4  HCT 40.4   < > 39.2   < > 40.7   < > 46.0 46.0 39.4 41.8 38.7*  MCV 96.7   < > 95.6  --  98.3  --   --   --  99.5 96.8 96.0  PLT 130*   < > 125*  --  129*  --   --   --  119* 122* 116*   < > = values in this interval not displayed.    Basic Metabolic  Panel: Recent Labs  Lab 09/28/19 1104 09/28/19 1104 09/29/19 0001 09/29/19 1559 09/29/19 1627 09/30/19 0459 10/01/19 0424 10/02/19 0359 10/03/19 0407  NA 142   < > 142   < > 139 140 139 138 138  K 4.0   < > 3.7   < > 3.8 3.8 4.6 4.1 4.3  CL 95*   < > 93*  --   --  90* 89* 90*  91*  CO2 35*   < > 39*  --   --  40* 37* 37* 36*  GLUCOSE 75   < > 150*  --   --  128* 102* 87 87  BUN 14   < > 17  --   --  18 26* 26* 25*  CREATININE 1.36*   < > 1.39*  --   --  1.95* 2.79* 1.90* 1.50*  CALCIUM 8.7*   < > 8.8*  --   --  8.7* 9.2 8.7* 8.8*  MG 0.8*  --  1.3*  --   --  2.1  --   --   --   PHOS 3.5  --   --   --   --   --   --  4.1 4.7*   < > = values in this interval not displayed.    Liver Function Tests: Recent Labs  Lab 09/27/19 1912 09/28/19 1104 09/30/19 0459 10/02/19 0359 10/03/19 0407  AST 71* 67* 69*  --   --   ALT 84* 76* 63*  --   --   ALKPHOS 71 70 74  --   --   BILITOT 1.5* 3.3* 1.6*  --   --   PROT 7.6 7.1 6.5  --   --   ALBUMIN 3.7 3.4* 3.1* 2.9* 2.8*    CBG: Recent Labs  Lab 09/30/19 2115 10/01/19 0559 10/01/19 1129 10/01/19 1636 10/03/19 0614  GLUCAP 104* 98 102* 83 87     Urinalysis    Component Value Date/Time   COLORURINE AMBER (A) 10/01/2019 1429   APPEARANCEUR CLOUDY (A) 10/01/2019 1429   LABSPEC 1.020 10/01/2019 1429   PHURINE 6.0 10/01/2019 1429   GLUCOSEU NEGATIVE 10/01/2019 1429   HGBUR NEGATIVE 10/01/2019 1429   BILIRUBINUR NEGATIVE 10/01/2019 1429   KETONESUR NEGATIVE 10/01/2019 1429   PROTEINUR 100 (A) 10/01/2019 1429   NITRITE NEGATIVE 10/01/2019 1429   LEUKOCYTESUR SMALL (A) 10/01/2019 1429      Time coordinating discharge: 45 minutes  SIGNED:  Vernell Leep, MD, FACP, [facility]. [facility]  To contact the attending provider between 7A-7P or the covering provider during after hours 7P-7A, please log into the web site [email or web address] and access using universal [place] password for that web site. If you do not have  the password, please call the hospital operator.

## 2019-10-03 NOTE — Progress Notes (Signed)
Progress Note  Patient Name: Aaron Fuller Date of Encounter: 10/03/2019  Primary Cardiologist: Armanda Magic, MD   Subjective   Denies any chest pain or SOB.  Anxious to go home  Inpatient Medications    Scheduled Meds: . apixaban  5 mg Oral BID  . atorvastatin  20 mg Oral q1800  . docusate sodium  100 mg Oral BID  . folic acid  1 mg Oral Daily  . magnesium oxide  400 mg Oral BID  . metoprolol succinate  50 mg Oral Daily  . multivitamin with minerals  1 tablet Oral Daily  . nicotine  14 mg Transdermal Daily  . sodium chloride flush  3 mL Intravenous Q12H  . sodium chloride flush  3 mL Intravenous Q12H  . sodium chloride flush  3 mL Intravenous Q12H  . thiamine  100 mg Oral Daily   Or  . thiamine  100 mg Intravenous Daily   Continuous Infusions: . sodium chloride    . sodium chloride     PRN Meds: sodium chloride, sodium chloride, acetaminophen, bisacodyl, HYDROcodone-acetaminophen, ondansetron (ZOFRAN) IV, ondansetron **OR** [DISCONTINUED] ondansetron (ZOFRAN) IV, polyethylene glycol, sodium chloride flush, sodium chloride flush, zolpidem   Vital Signs    Vitals:   10/02/19 2014 10/03/19 0020 10/03/19 0504 10/03/19 0746  BP: 106/73 110/75 125/70 137/88  Pulse: 80 81 80 86  Resp: 20 19 18 18   Temp: 98.8 F (37.1 C) 98.8 F (37.1 C) 98.6 F (37 C) 98.2 F (36.8 C)  TempSrc: Oral Oral Oral Oral  SpO2: 100% 100% 98% 97%  Weight:   75.1 kg   Height:        Intake/Output Summary (Last 24 hours) at 10/03/2019 0845 Last data filed at 10/03/2019 12/03/2019 Gross per 24 hour  Intake 1280 ml  Output 675 ml  Net 605 ml   Filed Weights   10/01/19 0438 10/02/19 0532 10/03/19 0504  Weight: 73.1 kg 74.6 kg 75.1 kg    Telemetry    NSR- Personally Reviewed  ECG    No new EKG to review Personally Reviewed  Physical Exam   GEN: Well nourished, well developed in no acute distress HEENT: Normal NECK: No JVD; No carotid bruits LYMPHATICS: No  lymphadenopathy CARDIAC:RRR, no murmurs, rubs, gallops RESPIRATORY:  Clear to auscultation without rales, wheezing or rhonchi  ABDOMEN: Soft, non-tender, non-distended MUSCULOSKELETAL:  No edema; No deformity  SKIN: Warm and dry NEUROLOGIC:  Alert and oriented x 3 PSYCHIATRIC:  Normal affect    Labs    Chemistry Recent Labs  Lab 09/27/19 1912 09/27/19 1912 09/28/19 1104 09/29/19 0001 09/30/19 0459 09/30/19 0459 10/01/19 0424 10/02/19 0359 10/03/19 0407  NA 138   < > 142   < > 140   < > 139 138 138  K 4.2   < > 4.0   < > 3.8   < > 4.6 4.1 4.3  CL 97*   < > 95*   < > 90*   < > 89* 90* 91*  CO2 30   < > 35*   < > 40*   < > 37* 37* 36*  GLUCOSE 84   < > 75   < > 128*   < > 102* 87 87  BUN 22   < > 14   < > 18   < > 26* 26* 25*  CREATININE 1.64*   < > 1.36*   < > 1.95*   < > 2.79* 1.90* 1.50*  CALCIUM 8.7*   < >  8.7*   < > 8.7*   < > 9.2 8.7* 8.8*  PROT 7.6  --  7.1  --  6.5  --   --   --   --   ALBUMIN 3.7   < > 3.4*  --  3.1*  --   --  2.9* 2.8*  AST 71*  --  67*  --  69*  --   --   --   --   ALT 84*  --  76*  --  63*  --   --   --   --   ALKPHOS 71  --  70  --  74  --   --   --   --   BILITOT 1.5*  --  3.3*  --  1.6*  --   --   --   --   GFRNONAA 43*   < > 54*   < > 35*   < > 23* 36* 48*  GFRAA 50*   < > >60   < > 41*   < > 26* 42* 56*  ANIONGAP 11   < > 12   < > 10   < > 13 11 11    < > = values in this interval not displayed.     Hematology Recent Labs  Lab 09/30/19 0459 10/01/19 0424 10/02/19 0359  WBC 5.3 6.3 5.7  RBC 3.96* 4.32 4.03*  HGB 13.3 14.2 13.4  HCT 39.4 41.8 38.7*  MCV 99.5 96.8 96.0  MCH 33.6 32.9 33.3  MCHC 33.8 34.0 34.6  RDW 16.7* 16.2* 15.9*  PLT 119* 122* 116*    Cardiac EnzymesNo results for input(s): TROPONINI in the last 168 hours. No results for input(s): TROPIPOC in the last 168 hours.   BNP Recent Labs  Lab 09/27/19 1912  BNP 604.8*     DDimer  Recent Labs  Lab 09/27/19 2017  DDIMER 1.17*     Radiology    2018  RENAL  Result Date: 10/01/2019 CLINICAL DATA:  Acute kidney injury. EXAM: RENAL / URINARY TRACT ULTRASOUND COMPLETE COMPARISON:  None only the upper portion of the kidneys are imaged on a CT a of the chest. FINDINGS: Right Kidney: Renal measurements: 10.0 x 3.9 x 4.2 cm = volume: 86 mL. Poor corticomedullary differentiation. No sign of hydronephrosis. Limited assessment perhaps due to body wall edema. Left Kidney: Renal measurements: 8.7 x 4.4 x 3.2 cm = volume: 64 ML. Poor corticomedullary differentiation. Limited assessment of the left kidney secondary to overlying bowel. No gross hydronephrosis. Bladder: Appears normal for degree of bladder distention. Other: None. IMPRESSION: Findings could be seen in the setting of medical renal disease with poor corticomedullary differentiation. No hydronephrosis. Study limited by body wall edema and overlying bowel gas. Electronically Signed   By: 12/01/2019 M.D.   On: 10/01/2019 20:52    Cardiac Studies   2D echo IMPRESSIONS   1. Left ventricular ejection fraction, by estimation, is 55 to 60%. The  left ventricle has normal function. The left ventricle has no regional  wall motion abnormalities. There is mild concentric left ventricular  hypertrophy. Left ventricular diastolic  parameters are consistent with Grade I diastolic dysfunction (impaired  relaxation).  2. Right ventricular systolic function is moderately reduced. The right  ventricular size is moderately enlarged. There is moderately elevated  pulmonary artery systolic pressure. The estimated right ventricular  systolic pressure is 54.7 mmHg.  3. Right atrial size was moderately dilated.  4. The mitral  valve is normal in structure. No evidence of mitral valve  regurgitation.  5. The aortic valve is tricuspid. Aortic valve regurgitation is moderate.  No aortic stenosis is present.  6. The inferior vena cava is dilated in size with <50% respiratory  variability, suggesting right  atrial pressure of 15 mmHg.   Cardiac Cath 09/2019 Conclusion    Hemodynamic findings consistent with moderate pulmonary hypertension.   Moderately elevated right heart pressures with PA pressure 63/28 and mean pressure 39 mmHg consistent with at least moderate pulmonary hypertension.  PVR: 2.9 WU  LVEDP 17 - 18 mmHg.  Normal epicardial coronary arteries.  RECOMMENDATION: Optimization of fluid status, blood pressure, and cardiac rhythm in this patient diastolic dysfunction who presented with SVT/atrial flutter.  Smoking cessation is essential.     Patient Profile     64 y.o. male  with a hx of HTN, tobacco and ETOH (1 pint Vodka daliy) abuse and fm hx of CAD with his father having an MI in his late 26's.  Admitted with 1 week hx of DOE and LE edema, Orthopnea and PND and found to be in acute CHF and atrial flutter with RVR.  Assessment & Plan    Acute diastolic heart failure Presented with a couple days of worsening sob and LLE. BNP elevated to 604. HS troponin mildly elevated. Albumin 3.4. CXR clear. CTA chest negative for PE.  - Echo showed EF 55-60%, no WMA, G1DD, moderately reduced RV function, mild LVH, mod AI - started on IV lasix 40 mg BID - diuretics held due to increasing creatinine - creatinine improved this am (1.64>1.36>1.39>1.95>2.79 >1.9>1.5) - he put out 525cc yesteday ? Accuracy and is net neg 3L - appreciate renal input - will restart PO Lasix at 20mg  daily - continue Toprol XL to 50mg  daily  - Etiology of right heart failure and pulmonary HTN likely related to severe COPD noted on PFTs - Chest CTA neg for acute PE.  RA also enlarged.   - He has moderate pulmonary HTN and with smoking hx, likely has COPD as at least part of the etiology of his elevated PAP and likely a component of pulmonary venous HTN from diastolic CHF based on RHC showing elevated LVEDP - PVR borderline at 2.9WU - PFTs  report shows severe COPD  SVT/atrial flutter with RVR - on  admission noted to be in SVT broke with adenosine and subsequent rhythm thought to be atrial flutter - maintaining NSR on tele - TSH 0.914 - K+ 4.3, Mag 2.1 - CHADS2VASC score is 2 - continue Eliquis 5mg  BID today - continue on BB  Elevated Troponin - HS troponin mildly elevated at 70>65 - Patient has RF for CAD including HTN, ETOH, tobacco use, family history - likely related to CHF as normal coronary arteries on cath - continue ASA  HTN - not on meds at baseline - BP much improved after increase in BB - continue Toprol XL 50mg  daily  HLD - started on lipitor - LDL 71  ETOH dependence - chronic>>plans to stop - CIWA - per IM  Tobacco dependence - patient plans to stop - PFTs c/w severe COPD - O2 Sats now 97% on RA - per TRH  AKI -likely related to cath but improving with holding diuretics and gentle hydration (1.64>1.36>1.39>1.95>2.79>1.90>1.5) -appreciate renal input  [facility] will sign off.   Medication Recommendations:  Toprol XL 50mg  daily, Lasix 20mg  daily, Atorvastatin 20mg  daily, Apixaban 5mg  BID Other recommendations (labs, testing, etc):  BMET on  Wed 4/14 Follow up as an outpatient:  TOC followup in 7-10 days in our office   For questions or updates, please contact [facility] Please consult [email or web address] for contact info under Cardiology/STEMI.      Signed, Armanda Magic, MD  10/03/2019, 8:45 AM

## 2019-10-03 NOTE — Care Management (Addendum)
Notified [facility] hat patient will leave today.  Spoke w patient and is agreeable to [employer] for home oxygen. Referral made and oxygen will be delivered to room for transport home. Patient verified he will have a ride home.   Eliquis 30 day card provided to patient

## 2019-10-03 NOTE — Progress Notes (Signed)
Pt awating oxygen delivery to room for discharge home.

## 2019-10-03 NOTE — Progress Notes (Signed)
SATURATION QUALIFICATIONS: (This note is used to comply with regulatory documentation for home oxygen)  Patient Saturations on Room Air at Rest = 92%  Patient Saturations on Room Air while Ambulating = 84%  Patient Saturations on 2 Liters of oxygen while Ambulating = 95%  Please briefly explain why patient needs home oxygen: pt desats while ambulating

## 2019-10-03 NOTE — Discharge Instructions (Signed)
Information on my medicine - ELIQUIS (apixaban)  This medication education was reviewed with me or my healthcare representative as part of my discharge preparation.  Why was Eliquis prescribed for you? Eliquis was prescribed for you to reduce the risk of a blood clot forming that can cause a stroke if you have a medical condition called atrial fibrillation (a type of irregular heartbeat).  What do You need to know about Eliquis ? Take your Eliquis TWICE DAILY - one tablet in the morning and one tablet in the evening with or without food. If you have difficulty swallowing the tablet whole please discuss with your pharmacist how to take the medication safely.  Take Eliquis exactly as prescribed by your doctor and DO NOT stop taking Eliquis without talking to the doctor who prescribed the medication.  Stopping may increase your risk of developing a stroke.  Refill your prescription before you run out.  After discharge, you should have regular check-up appointments with your healthcare provider that is prescribing your Eliquis.  In the future your dose may need to be changed if your kidney function or weight changes by a significant amount or as you get older.  What do you do if you miss a dose? If you miss a dose, take it as soon as you remember on the same day and resume taking twice daily.  Do not take more than one dose of ELIQUIS at the same time to make up a missed dose.  Important Safety Information A possible side effect of Eliquis is bleeding. You should call your healthcare provider right away if you experience any of the following: ? Bleeding from an injury or your nose that does not stop. ? Unusual colored urine (red or dark brown) or unusual colored stools (red or black). ? Unusual bruising for unknown reasons. ? A serious fall or if you hit your head (even if there is no bleeding).  Some medicines may interact with Eliquis and might increase your risk of bleeding or  clotting while on Eliquis. To help avoid this, consult your healthcare provider or pharmacist prior to using any new prescription or non-prescription medications, including herbals, vitamins, non-steroidal anti-inflammatory drugs (NSAIDs) and supplements.  This website has more information on Eliquis (apixaban): http://[email or web address].com/eliquis/home   Additional discharge instructions  Please get your medications reviewed and adjusted by your Primary MD.  Please request your Primary MD to go over all Hospital Tests and Procedure/Radiological results at the follow up, please get all Hospital records sent to your Prim MD by signing hospital release before you go home.  If you had Pneumonia of Lung problems at the Hospital: Please get a 2 view Chest X ray done in 6-8 weeks after hospital discharge or sooner if instructed by your Primary MD.  If you have Congestive Heart Failure: Please call your Cardiologist or Primary MD anytime you have any of the following symptoms:  1) 3 pound weight gain in 24 hours or 5 pounds in 1 week  2) shortness of breath, with or without a dry hacking cough  3) swelling in the hands, feet or stomach  4) if you have to sleep on extra pillows at night in order to breathe  Follow cardiac low salt diet and 1.5 lit/day fluid restriction.  If you have diabetes Accuchecks 4 times/day, Once in AM empty stomach and then before each meal. Log in all results and show them to your primary doctor at your next visit. If any glucose reading is under 80   or above 300 call your primary MD immediately.  If you have Seizure/Convulsions/Epilepsy: Please do not drive, operate heavy machinery, participate in activities at heights or participate in high speed sports until you have seen by Primary MD or a Neurologist and advised to do so again.  If you had Gastrointestinal Bleeding: Please ask your Primary MD to check a complete blood count within one week of discharge or at your  next visit. Your endoscopic/colonoscopic biopsies that are pending at the time of discharge, will also need to followed by your Primary MD.  Get Medicines reviewed and adjusted. Please take all your medications with you for your next visit with your Primary MD  Please request your Primary MD to go over all hospital tests and procedure/radiological results at the follow up, please ask your Primary MD to get all Hospital records sent to his/her office.  If you experience worsening of your admission symptoms, develop shortness of breath, life threatening emergency, suicidal or homicidal thoughts you must seek medical attention immediately by calling 911 or calling your MD immediately  if symptoms less severe.  You must read complete instructions/literature along with all the possible adverse reactions/side effects for all the Medicines you take and that have been prescribed to you. Take any new Medicines after you have completely understood and accpet all the possible adverse reactions/side effects.   Do not drive or operate heavy machinery when taking Pain medications.   Do not take more than prescribed Pain, Sleep and Anxiety Medications  Special Instructions: If you have smoked or chewed Tobacco  in the last 2 yrs please stop smoking, stop any regular Alcohol  and or any Recreational drug use.  Wear Seat belts while driving.  Please note You were cared for by a hospitalist during your hospital stay. If you have any questions about your discharge medications or the care you received while you were in the hospital after you are discharged, you can call the unit and asked to speak with the hospitalist on call if the hospitalist that took care of you is not available. Once you are discharged, your primary care physician will handle any further medical issues. Please note that NO REFILLS for any discharge medications will be authorized once you are discharged, as it is imperative that you return to  your primary care physician (or establish a relationship with a primary care physician if you do not have one) for your aftercare needs so that they can reassess your need for medications and monitor your lab values.  You can reach the hospitalist office at phone [phone] or fax [phone]   If you do not have a primary care physician, you can call [phone] for a physician referral.   

## 2019-10-06 ENCOUNTER — Other Ambulatory Visit: Payer: Medicare Other | Admitting: *Deleted

## 2019-10-06 ENCOUNTER — Other Ambulatory Visit: Payer: Self-pay

## 2019-10-06 DIAGNOSIS — I5032 Chronic diastolic (congestive) heart failure: Secondary | ICD-10-CM

## 2019-10-07 LAB — BASIC METABOLIC PANEL
BUN/Creatinine Ratio: 13 (ref 10–24)
BUN: 17 mg/dL (ref 8–27)
CO2: 39 mmol/L — ABNORMAL HIGH (ref 20–29)
Calcium: 9.5 mg/dL (ref 8.6–10.2)
Chloride: 98 mmol/L (ref 96–106)
Creatinine, Ser: 1.26 mg/dL (ref 0.76–1.27)
GFR calc Af Amer: 69 mL/min/{1.73_m2} (ref >59)
GFR calc non Af Amer: 59 mL/min/{1.73_m2} — ABNORMAL LOW (ref >59)
Glucose: 77 mg/dL (ref 65–99)
Potassium: 4.6 mmol/L (ref 3.5–5.2)
Sodium: 144 mmol/L (ref 134–144)

## 2019-10-19 ENCOUNTER — Ambulatory Visit: Payer: Medicare Other | Admitting: Cardiology

## 2019-11-07 NOTE — Progress Notes (Signed)
Cardiology Office Note:    Date:  11/08/2019   ID:  Aaron Fuller, DOB 01-06-1956, MRN [id number]  PCP:  Wynn Banker, PA  Cardiologist:  Fransico Him, MD    Referring MD: No ref. provider found   Chief Complaint  Patient presents with  . Hypertension  . Congestive Heart Failure  . Atrial Flutter    History of Present Illness:    Aaron Fuller is a 64 y.o. male with a hx of HTN, alcohol dependence, tobacco abuse, admitted to [facility] via [facility] with complaints of dyspnea, orthopnea, PND and bilateral lower extremity edema last month. In the ED noted to be in SVT in the 170s, hypertensive, given adenosine 6 mg, rhythm reviewed by Cardiology and felt to be possible atrial flutter, Cardizem infusion started, converted to sinus rhythm. Also hypoxic at 88% on room air. CTA chest negative for PE. Admitted for new onset acute diastolic CHF, SVT/atrial flutter with RVR, elevated troponin, acute respiratory failure with hypoxia. Diuresed with IV Lasix. S/p cardiac cath 4/7 with normal coronary arteries and elevated filling pressures.  Course complicated by AKI, creatinine up to 2.79 on 4/8 and nephrology consulted and felt to be due to aggressive IV Lasix diuresis, contrast nephropathy from recent cardiac cath and CTA chest and hemodynamics.  RHC showed pulmonary HTN and PFTs c/w severe COPD and was set up to see Pulmonary.  He was started on ELiquis 5mg  BID for CHADS2VASC score of 2.    He is here today for followup and is doing well.  He denies any chest pain or pressure, SOB, DOE, PND, orthopnea, LE edema, dizziness, palpitations or syncope. He is compliant with his meds and is tolerating meds with no SE.    Past Medical History:  Diagnosis Date  . Alcohol dependence ([place])   . Hypertension   . Tobacco dependence     Past Surgical History:  Procedure Laterality Date  . RIGHT/LEFT HEART CATH AND CORONARY ANGIOGRAPHY Bilateral 09/29/2019   Procedure: RIGHT/LEFT HEART CATH AND CORONARY  ANGIOGRAPHY;  Surgeon: Troy Sine, MD;  Location: [place] CV [facility];  Service: Cardiovascular;  Laterality: Bilateral;  . ulcer surgery      Current Medications: Current Meds  Medication Sig  . albuterol (VENTOLIN HFA) 108 (90 Base) MCG/ACT inhaler Inhale 2 puffs into the lungs every 6 (six) hours as needed for wheezing or shortness of breath.  [name] Kitchen apixaban (ELIQUIS) 5 MG TABS tablet Take 1 tablet (5 mg total) by mouth 2 (two) times daily.  [name] Kitchen atorvastatin (LIPITOR) 20 MG tablet Take 1 tablet (20 mg total) by mouth daily at 6 PM.  . diphenhydrAMINE (BENADRYL) 25 mg capsule Take 25 mg by mouth every 6 (six) hours as needed (for grass/pollen-induced allergic symptoms).   . folic acid (FOLVITE) 1 MG tablet Take 1 tablet (1 mg total) by mouth daily.  . furosemide (LASIX) 20 MG tablet Take 20 mg by mouth daily.  . metoprolol succinate (TOPROL-XL) 50 MG 24 hr tablet Take 1 tablet (50 mg total) by mouth daily. Take with or immediately following a meal.  . Multiple Vitamin (MULTIVITAMIN WITH MINERALS) TABS tablet Take 1 tablet by mouth daily.  . nicotine (NICODERM CQ - DOSED IN MG/24 HOURS) 14 mg/24hr patch Place 1 patch (14 mg total) onto the skin daily.  [name] Kitchen thiamine 100 MG tablet Take 1 tablet (100 mg total) by mouth daily.     Allergies:   Latex   Social History   Socioeconomic History  . Marital  status: Single    Spouse name: Not on file  . Number of children: Not on file  . Years of education: Not on file  . Highest education level: Not on file  Occupational History  . Occupation: retired Investment banker, operational  Tobacco Use  . Smoking status: Current Every Day Smoker    Packs/day: 1.00    Years: 49.00    Pack years: 49.00  . Smokeless tobacco: Never Used  . Tobacco comment: currently 0.5  Substance and Sexual Activity  . Alcohol use: Yes    Comment: drinks 1 pint a day, last drink 2-3 days ago  . Drug use: Never  . Sexual activity: Not on file  Other Topics Concern  . Not on file  Social  History Narrative  . Not on file   Social Determinants of Health   Financial Resource Strain:   . Difficulty of Paying Living Expenses:   Food Insecurity:   . Worried About Programme researcher, broadcasting/film/video in the Last Year:   . Barista in the Last Year:   Transportation Needs:   . Freight forwarder (Medical):   [name] Kitchen Lack of Transportation (Non-Medical):   Physical Activity:   . Days of Exercise per Week:   . Minutes of Exercise per Session:   Stress:   . Feeling of Stress :   Social Connections:   . Frequency of Communication with Friends and Family:   . Frequency of Social Gatherings with Friends and Family:   . Attends Religious Services:   . Active Member of Clubs or Organizations:   . Attends Banker Meetings:   [name] Kitchen Marital Status:      Family History: The patient's family history includes CAD in his father and mother; Diabetes in his mother and sister; Heart failure in his mother.  ROS:   Please see the history of present illness.    ROS  All other systems reviewed and negative.   EKGs/Labs/Other Studies Reviewed:    The following studies were reviewed today: Cardiac cath and 2D echo  EKG:  EKG is not ordered today.   Recent Labs: 09/27/2019: B Natriuretic Peptide 604.8 09/28/2019: TSH 0.914 09/30/2019: ALT 63; Magnesium 2.1 10/02/2019: Hemoglobin 13.4; Platelets 116 10/06/2019: BUN 17; Creatinine, Ser 1.26; Potassium 4.6; Sodium 144   Recent Lipid Panel    Component Value Date/Time   CHOL 186 09/28/2019 1104   TRIG 71 09/28/2019 1104   HDL 101 09/28/2019 1104   CHOLHDL 1.8 09/28/2019 1104   VLDL 14 09/28/2019 1104   LDLCALC 71 09/28/2019 1104    Physical Exam:    VS:  BP (!) 162/92   Pulse 84   Ht 5\' 9"  (1.753 m)   Wt 176 lb 12.8 oz (80.2 kg)   SpO2 (!) 87%   BMI 26.11 kg/m     Wt Readings from Last 3 Encounters:  11/08/19 176 lb 12.8 oz (80.2 kg)  10/03/19 165 lb 8 oz (75.1 kg)     GEN:  Well nourished, well developed in no acute  distress HEENT: Normal NECK: No JVD; No carotid bruits LYMPHATICS: No lymphadenopathy CARDIAC: RRR, no murmurs, rubs, gallops RESPIRATORY: scattered wheezes ABDOMEN: Soft, non-tender, non-distended MUSCULOSKELETAL:  trace edema; No deformity  SKIN: Warm and dry NEUROLOGIC:  Alert and oriented x 3 PSYCHIATRIC:  Normal affect   ASSESSMENT:    1. Chronic diastolic CHF (congestive heart failure) (HCC)   2. Pulmonary hypertension, unspecified (HCC)   3. Atrial flutter, unspecified type (HCC)  4. Elevated troponin   5. Essential hypertension   6. Pure hypercholesterolemia   7. ETOH abuse   8. Tobacco dependence    PLAN:    In order of problems listed above:  1.  Chronic diastolic heart failure 1.Presented with a couple days of worsening sob and LLE. BNP elevated to 604. HS troponin mildly elevated. CTA chest negative for PE.  - Echoshowed EF 55-60%, no WMA, G1DD, moderately reduced RV function, mild LVH, mod AI - diuresed on IV lasix which was then held due to bumped Creatinine from contrast from cath/diuresis - Etiology of right heart failure and pulmonary HTN likely related to severe COPD noted on PFTs - LE edema fluctuates but is taking Lasix 20mg  daily - suspect it is due to dietary sources of Na that he is unaware of even though he is not salting his food - he eats out fast food a lot (cheese steaks, pizza) - I encouraged him to follow a < 2gm Na diet - refer to a nutritionist - increase Lasix to 20mg  qod alt with 40mg  qod - check BMET in 1 week  2.  Moderate pulmonary HTN  - related to severe COPD and diastolic CHF - PVR borderline at 2.9WU - PFTs  with severe COPD - continue inhalers, O2 and diuretics  3.  SVT/atrial flutter with RVR - on admission noted to be in SVT broke with adenosine and subsequent rhythm thought to be atrial flutter - TSH normal - he has not had any further palpitations - CHADS2VASC score is 2 - continue Eliquis 5mg  BID today - continue on  BB  4.  Elevated Troponin - cath with normal coronary arteries - felt related to demand ischemia from CHF and SVT  5.  HTN - BP remains elevated - increase Toprol XL to 75mg  daily - HTN clinic in 1 week  6.  HLD - started on lipitor - LDL 71 - repeat FLP ALT  7.  ETOH dependence - chronic>>he has stopped  8.  Tobacco dependence - he has gotten down to 2 cig daily - PFTs c/w severe COPD - on home O2 at night  9.  AKI -likely related to cath but improving with holding diuretics and gentle hydration  -last creatinine 1.26 at discharge in April -repeat in 1 week after starting Lasix for edema   Medication Adjustments/Labs and Tests Ordered: Current medicines are reviewed at length with the patient today.  Concerns regarding medicines are outlined above.  No orders of the defined types were placed in this encounter.  No orders of the defined types were placed in this encounter.   Signed, , MD  11/08/2019 11:33 AM    [facility]

## 2019-11-08 ENCOUNTER — Ambulatory Visit (INDEPENDENT_AMBULATORY_CARE_PROVIDER_SITE_OTHER): Payer: Medicare Other | Admitting: Cardiology

## 2019-11-08 ENCOUNTER — Other Ambulatory Visit: Payer: Self-pay

## 2019-11-08 ENCOUNTER — Encounter: Payer: Self-pay | Admitting: Cardiology

## 2019-11-08 VITALS — BP 162/92 | HR 84 | Ht 69.0 in | Wt 176.8 lb

## 2019-11-08 DIAGNOSIS — F1010 Alcohol abuse, uncomplicated: Secondary | ICD-10-CM

## 2019-11-08 DIAGNOSIS — I5032 Chronic diastolic (congestive) heart failure: Secondary | ICD-10-CM | POA: Diagnosis not present

## 2019-11-08 DIAGNOSIS — I4892 Unspecified atrial flutter: Secondary | ICD-10-CM | POA: Diagnosis not present

## 2019-11-08 DIAGNOSIS — R778 Other specified abnormalities of plasma proteins: Secondary | ICD-10-CM

## 2019-11-08 DIAGNOSIS — F17200 Nicotine dependence, unspecified, uncomplicated: Secondary | ICD-10-CM

## 2019-11-08 DIAGNOSIS — I272 Pulmonary hypertension, unspecified: Secondary | ICD-10-CM

## 2019-11-08 DIAGNOSIS — I10 Essential (primary) hypertension: Secondary | ICD-10-CM

## 2019-11-08 DIAGNOSIS — E7800 Pure hypercholesterolemia, unspecified: Secondary | ICD-10-CM

## 2019-11-08 LAB — COMPREHENSIVE METABOLIC PANEL
ALT: 31 IU/L (ref 0–44)
AST: 30 IU/L (ref 0–40)
Albumin/Globulin Ratio: 1 — ABNORMAL LOW (ref 1.2–2.2)
Albumin: 3.5 g/dL — ABNORMAL LOW (ref 3.8–4.8)
Alkaline Phosphatase: 84 IU/L (ref 48–121)
BUN/Creatinine Ratio: 10 (ref 10–24)
BUN: 15 mg/dL (ref 8–27)
Bilirubin Total: 0.9 mg/dL (ref 0.0–1.2)
CO2: 33 mmol/L — ABNORMAL HIGH (ref 20–29)
Calcium: 9.4 mg/dL (ref 8.6–10.2)
Chloride: 93 mmol/L — ABNORMAL LOW (ref 96–106)
Creatinine, Ser: 1.43 mg/dL — ABNORMAL HIGH (ref 0.76–1.27)
GFR calc Af Amer: 59 mL/min/{1.73_m2} — ABNORMAL LOW (ref >59)
GFR calc non Af Amer: 51 mL/min/{1.73_m2} — ABNORMAL LOW (ref >59)
Globulin, Total: 3.4 g/dL (ref 1.5–4.5)
Glucose: 87 mg/dL (ref 65–99)
Potassium: 4.3 mmol/L (ref 3.5–5.2)
Sodium: 142 mmol/L (ref 134–144)
Total Protein: 6.9 g/dL (ref 6.0–8.5)

## 2019-11-08 MED ORDER — FUROSEMIDE 20 MG PO TABS: 20.0000 mg | ORAL_TABLET | ORAL | 3 refills | Status: DC

## 2019-11-08 MED ORDER — METOPROLOL SUCCINATE ER 50 MG PO TB24: 75.0000 mg | ORAL_TABLET | Freq: Every day | ORAL | 3 refills | Status: DC

## 2019-11-08 NOTE — Addendum Note (Signed)
Addended by: Theresia Majors on: 11/08/2019 11:42 AM   Modules accepted: Orders

## 2019-11-08 NOTE — Patient Instructions (Signed)
Medication Instructions:  Your physician has recommended you make the following change in your medication:  1) INCREASE Toprol to 75 mg daily (1.5 tablets daily) 2) INCREASE Lasix (furosemide) to 20 mg every other day alternating with 40 mg every other day.  *If you need a refill on your cardiac medications before your next appointment, please call your pharmacy*   Lab Work: TODAY: CMET One Week: BMET If you have labs (blood work) drawn today and your tests are completely normal, you will receive your results only by: [name] Kitchen MyChart Message (if you have MyChart) OR . A paper copy in the mail If you have any lab test that is abnormal or we need to change your treatment, we will call you to review the results.  Follow-Up: At [facility], you and your health needs are our priority.  As part of our continuing mission to provide you with exceptional heart care, we have created designated Provider Care Teams.  These Care Teams include your primary Cardiologist (physician) and Advanced Practice Providers (APPs -  Physician Assistants and Nurse Practitioners) who all work together to provide you with the care you need, when you need it.  We recommend signing up for the patient portal called "MyChart".  Sign up information is provided on this After Visit Summary.  MyChart is used to connect with patients for Virtual Visits (Telemedicine).  Patients are able to view lab/test results, encounter notes, upcoming appointments, etc.  Non-urgent messages can be sent to your provider as well.   To learn more about what you can do with MyChart, go to [email or web address].    Follow up with PharmD in the Hypertension clinic in one week.   Other Instructions You have been referred to a nutritionist.

## 2019-11-09 ENCOUNTER — Telehealth: Payer: Self-pay

## 2019-11-09 ENCOUNTER — Other Ambulatory Visit: Payer: Medicare Other

## 2019-11-09 MED ORDER — FUROSEMIDE 20 MG PO TABS: 20.0000 mg | ORAL_TABLET | Freq: Every day | ORAL | 3 refills | Status: DC

## 2019-11-09 NOTE — Telephone Encounter (Signed)
-----   Message from Quintella Reichert, MD sent at 11/08/2019  9:52 PM EDT ----- Please let patient know that Creatinine bumped some so I would like him to stay at Lasix 20mg  daily and not increase as initially instructed at OV 5/17.  Needs to cut out fast food and hopefully edema will stay down

## 2019-11-10 ENCOUNTER — Other Ambulatory Visit: Payer: Self-pay

## 2019-11-10 MED ORDER — APIXABAN 5 MG PO TABS: 5.0000 mg | ORAL_TABLET | Freq: Two times a day (BID) | ORAL | 6 refills | Status: DC

## 2019-11-10 MED ORDER — ATORVASTATIN CALCIUM 20 MG PO TABS: 20.0000 mg | ORAL_TABLET | Freq: Every day | ORAL | 3 refills | Status: AC

## 2019-11-10 MED ORDER — FUROSEMIDE 20 MG PO TABS: 20.0000 mg | ORAL_TABLET | Freq: Every day | ORAL | 3 refills | Status: DC

## 2019-11-10 NOTE — Telephone Encounter (Signed)
Pt is requesting a refill on folic acid and Eliquis. Would Dr. Mayford Knife like to refill folic acid for the pt? Please address

## 2019-11-10 NOTE — Telephone Encounter (Signed)
Pt last saw Dr Mayford Knife 11/08/19, last labs 11/08/19 Creat 1.43, age 64, weight 80.2kg, based on specified criteria pt is on appropriate dosage of Eliquis 5mg  BID.  Will refill rx.

## 2019-11-15 NOTE — Progress Notes (Unsigned)
Patient ID: Aaron Fuller                 DOB: Mar 10, 1956                      MRN: [id number]     HPI: Aaron Fuller is a 64 y.o. male referred by Dr. Mayford Knife to HTN clinic. PMH is significant for atrial tachycardia, HTN, diastolic CHF, pulmonary HTN, alcohol dependence, and tobacco dependence.  Patient was recently hospitalized (09/27/19 - 10/03/19) for new onset diastolic CHF, SVT/atrial flutter with RVR, elevated troponin, and acute respiratory failure with hypoxia. S/P cardiac cath (09/29/19) showed normal coronary arteries and elevated filling pressures. RHC showed pulmonary HTN and PFTs c/w severe COPD and was set up to see Pulmonary. Patient was also started on Eliquis 5 mg BID for atrial flutter (CHADSVASC 2).   Patient was seen by Dr. Mayford Knife on 11/08/19 for follow up after hospitalization. BP was 162/92 mmHg and pulse 84 bpm. She increased metoprolol XL 50 mg daily to metoprolol 75 mg daily. She also increased furosemide 20 mg daily --> furosemide 20 mg and 40 mg (alternating doses every other day). However, Scr was elevated to 1.43 on CMP drawn on 11/08/19 so Dr. Mayford Knife advised pt to decrease furosemide dose back down to 20 mg daily.  Patient presents for initial appt with HTN clinic.  Current HTN meds: metoprolol XL 75 mg daily, furosemide 20 mg daily. Previously tried: none  BP goal: <130/80 mmHg  Family History: CAD in his father and mother; Diabetes in his mother and sister; Heart failure in his mother.  Social History: current every day smoker (0.5 PPD, 49 pack year history); drinks 1 pint a day (last drink ***)  Diet:   Exercise:   Home BP readings:   Wt Readings from Last 3 Encounters:  11/08/19 176 lb 12.8 oz (80.2 kg)  10/03/19 165 lb 8 oz (75.1 kg)   BP Readings from Last 3 Encounters:  11/08/19 (!) 162/92  10/03/19 137/84   Pulse Readings from Last 3 Encounters:  11/08/19 84  10/03/19 83    Renal function: Estimated Creatinine Clearance: 50.8 mL/min (A) (by C-G formula  based on SCr of 1.43 mg/dL (H)).  Past Medical History:  Diagnosis Date  . Alcohol dependence (HCC)   . Hypertension   . Tobacco dependence     Current Outpatient Medications on File Prior to Visit  Medication Sig Dispense Refill  . albuterol (VENTOLIN HFA) 108 (90 Base) MCG/ACT inhaler Inhale 2 puffs into the lungs every 6 (six) hours as needed for wheezing or shortness of breath. 18 g 0  . apixaban (ELIQUIS) 5 MG TABS tablet Take 1 tablet (5 mg total) by mouth 2 (two) times daily. 60 tablet 6  . atorvastatin (LIPITOR) 20 MG tablet Take 1 tablet (20 mg total) by mouth daily at 6 PM. 90 tablet 3  . diphenhydrAMINE (BENADRYL) 25 mg capsule Take 25 mg by mouth every 6 (six) hours as needed (for grass/pollen-induced allergic symptoms).     . folic acid (FOLVITE) 1 MG tablet Take 1 tablet (1 mg total) by mouth daily. 30 tablet 0  . furosemide (LASIX) 20 MG tablet Take 1 tablet (20 mg total) by mouth daily. 90 tablet 3  . metoprolol succinate (TOPROL XL) 50 MG 24 hr tablet Take 1.5 tablets (75 mg total) by mouth daily. Take with or immediately following a meal. 135 tablet 3  . Multiple Vitamin (MULTIVITAMIN WITH MINERALS) TABS tablet  Take 1 tablet by mouth daily.    . nicotine (NICODERM CQ - DOSED IN MG/24 HOURS) 14 mg/24hr patch Place 1 patch (14 mg total) onto the skin daily. 28 patch 0  . thiamine 100 MG tablet Take 1 tablet (100 mg total) by mouth daily. 30 tablet 0   No current facility-administered medications on file prior to visit.    Allergies  Allergen Reactions  . Latex Rash     Assessment/Plan:  1. Hypertension - BP goal < 130/80 mmHg; therefore,   Thank you for involving pharmacy to assist in providing this patient's care.   Drexel Iha, PharmD PGY2 Ambulatory Care Pharmacy Resident

## 2019-11-16 ENCOUNTER — Other Ambulatory Visit: Payer: Medicare Other

## 2019-11-16 ENCOUNTER — Ambulatory Visit: Payer: Medicare Other

## 2020-01-22 LAB — EXTERNAL GENERIC LAB PROCEDURE

## 2020-01-22 LAB — COLOGUARD

## 2020-06-14 ENCOUNTER — Other Ambulatory Visit: Payer: Self-pay | Admitting: Cardiology

## 2020-06-14 MED ORDER — APIXABAN 5 MG PO TABS: 5.0000 mg | ORAL_TABLET | Freq: Two times a day (BID) | ORAL | 1 refills | Status: DC

## 2020-06-14 NOTE — Telephone Encounter (Signed)
Eliquis 5mg  refill request received. Patient is 64 years old, weight-80.2kg, Crea-1.43 on 11/08/2019, Diagnosis-Aflutter, and last seen by Dr. 11/10/2019 on 11/08/2019. Dose is appropriate based on dosing criteria. Will send in refill to requested pharmacy.

## 2020-06-14 NOTE — Telephone Encounter (Signed)
Pt is requesting a refill on thiamine 100 mg tablet and Eliquis 5 mg tablet. Would Dr. Mayford Knife like to refill this medication? Please address

## 2020-06-14 NOTE — Telephone Encounter (Signed)
   *  STAT* If patient is at the pharmacy, call can be transferred to refill team.   1. Which medications need to be refilled? (please list name of each medication and dose if known)   thiamine 100 MG tablet    apixaban (ELIQUIS) 5 MG TABS tablet   2. Which pharmacy/location (including street and city if local pharmacy) is medication to be sent to? [facility] - [place], [place] - [address]  3. Do they need a 30 day or 90 day supply? 90 days

## 2020-07-06 ENCOUNTER — Ambulatory Visit (INDEPENDENT_AMBULATORY_CARE_PROVIDER_SITE_OTHER): Payer: Medicare Other | Admitting: Cardiology

## 2020-07-06 ENCOUNTER — Encounter: Payer: Self-pay | Admitting: Cardiology

## 2020-07-06 ENCOUNTER — Other Ambulatory Visit: Payer: Self-pay

## 2020-07-06 VITALS — BP 158/90 | HR 77 | Ht 68.0 in | Wt 170.0 lb

## 2020-07-06 DIAGNOSIS — F17200 Nicotine dependence, unspecified, uncomplicated: Secondary | ICD-10-CM

## 2020-07-06 DIAGNOSIS — I5032 Chronic diastolic (congestive) heart failure: Secondary | ICD-10-CM | POA: Diagnosis not present

## 2020-07-06 DIAGNOSIS — I5022 Chronic systolic (congestive) heart failure: Secondary | ICD-10-CM | POA: Insufficient documentation

## 2020-07-06 DIAGNOSIS — I10 Essential (primary) hypertension: Secondary | ICD-10-CM

## 2020-07-06 DIAGNOSIS — E7800 Pure hypercholesterolemia, unspecified: Secondary | ICD-10-CM

## 2020-07-06 DIAGNOSIS — I272 Pulmonary hypertension, unspecified: Secondary | ICD-10-CM | POA: Insufficient documentation

## 2020-07-06 DIAGNOSIS — J449 Chronic obstructive pulmonary disease, unspecified: Secondary | ICD-10-CM | POA: Insufficient documentation

## 2020-07-06 DIAGNOSIS — I4892 Unspecified atrial flutter: Secondary | ICD-10-CM | POA: Insufficient documentation

## 2020-07-06 NOTE — Progress Notes (Addendum)
Cardiology Office Note:    Date:  07/06/2020   ID:  Aaron Fuller, DOB 05-Nov-1955, MRN [id number]  PCP:  Loura Pardon, PA  Cardiologist:  Armanda Magic, MD    Referring MD: Loura Pardon*   Chief Complaint  Patient presents with  . Atrial Flutter  . Hypertension  . Congestive Heart Failure    History of Present Illness:    Aaron Fuller is a 65 y.o. male with a hx of HTN, alcohol dependence, tobacco abuse, paroxysmal atrial flutter on chronic a/c with Eliquis for CHADS2VASC score of 2, chronic diastolic CHF, cardiac cath 09/29/19 with normal coronary arteries and elevated filling pressures.  RHC showed pulmonary HTN and PFTs c/w severe COPD now followed by pulmonary.     He is here today for followup and is doing well.  He has chronic DOE that he thinks has gotten better since I saw him last.  He denies any chest pain or pressure, PND, orthopnea,  dizziness, palpitations or syncope. He has chronic LE edema that comes and goes.  He admits to salting his food some. He is compliant with his meds and is tolerating meds with no SE.    Past Medical History:  Diagnosis Date  . Alcohol dependence (HCC)   . Chronic diastolic CHF (congestive heart failure) (HCC)    normal coronary arteries on cath  . COPD (chronic obstructive pulmonary disease) (HCC)   . Hypertension   . Paroxysmal atrial flutter (HCC)   . Pulmonary HTN (HCC)    secondary to COPD  . Tobacco dependence     Past Surgical History:  Procedure Laterality Date  . RIGHT/LEFT HEART CATH AND CORONARY ANGIOGRAPHY Bilateral 09/29/2019   Procedure: RIGHT/LEFT HEART CATH AND CORONARY ANGIOGRAPHY;  Surgeon: Lennette Bihari, MD;  Location: [facility];  Service: Cardiovascular;  Laterality: Bilateral;  . ulcer surgery      Current Medications: Current Meds  Medication Sig  . albuterol (VENTOLIN HFA) 108 (90 Base) MCG/ACT inhaler Inhale 2 puffs into the lungs every 6 (six) hours as needed for wheezing or  shortness of breath.  [name] Kitchen apixaban (ELIQUIS) 5 MG TABS tablet Take 1 tablet (5 mg total) by mouth 2 (two) times daily.  [name] Kitchen atorvastatin (LIPITOR) 20 MG tablet Take 1 tablet (20 mg total) by mouth daily at 6 PM.  . diphenhydrAMINE (BENADRYL) 25 mg capsule Take 25 mg by mouth every 6 (six) hours as needed (for grass/pollen-induced allergic symptoms).   . folic acid (FOLVITE) 1 MG tablet Take 1 tablet (1 mg total) by mouth daily.  . furosemide (LASIX) 20 MG tablet Take 1 tablet (20 mg total) by mouth daily. (Patient taking differently: Take 20 mg by mouth daily. Take 1 tablet (20 mg total) every other day, alternating with 2 tablets (40 mg total) every other day)  . metoprolol succinate (TOPROL XL) 50 MG 24 hr tablet Take 1.5 tablets (75 mg total) by mouth daily. Take with or immediately following a meal.  . Multiple Vitamin (MULTIVITAMIN WITH MINERALS) TABS tablet Take 1 tablet by mouth daily.  . nicotine (NICODERM CQ - DOSED IN MG/24 HOURS) 14 mg/24hr patch Place 1 patch (14 mg total) onto the skin daily.  [name] Kitchen thiamine 100 MG tablet Take 1 tablet (100 mg total) by mouth daily.     Allergies:   Latex   Social History   Socioeconomic History  . Marital status: Single    Spouse name: Not on file  . Number of children: Not  on file  . Years of education: Not on file  . Highest education level: Not on file  Occupational History  . Occupation: retired Investment banker, operational  Tobacco Use  . Smoking status: Current Every Day Smoker    Packs/day: 1.00    Years: 49.00    Pack years: 49.00  . Smokeless tobacco: Never Used  . Tobacco comment: currently 0.5  Substance and Sexual Activity  . Alcohol use: Yes    Comment: drinks 1 pint a day, last drink 2-3 days ago  . Drug use: Never  . Sexual activity: Not on file  Other Topics Concern  . Not on file  Social History Narrative  . Not on file   Social Determinants of Health   Financial Resource Strain: Not on file  Food Insecurity: Not on file  Transportation  Needs: Not on file  Physical Activity: Not on file  Stress: Not on file  Social Connections: Not on file     Family History: The patient's family history includes CAD in his father and mother; Diabetes in his mother and sister; Heart failure in his mother.  ROS:   Please see the history of present illness.    ROS  All other systems reviewed and negative.   EKGs/Labs/Other Studies Reviewed:    The following studies were reviewed today: Cardiac cath and 2D echo  EKG:  EKG is not ordered today.   Recent Labs: 09/27/2019: B Natriuretic Peptide 604.8 09/28/2019: TSH 0.914 09/30/2019: Magnesium 2.1 10/02/2019: Hemoglobin 13.4; Platelets 116 11/08/2019: ALT 31; BUN 15; Creatinine, Ser 1.43; Potassium 4.3; Sodium 142   Recent Lipid Panel    Component Value Date/Time   CHOL 186 09/28/2019 1104   TRIG 71 09/28/2019 1104   HDL 101 09/28/2019 1104   CHOLHDL 1.8 09/28/2019 1104   VLDL 14 09/28/2019 1104   LDLCALC 71 09/28/2019 1104    Physical Exam:    VS:  BP (!) 158/90   Pulse 77   Ht 5\' 8"  (1.727 m)   Wt 170 lb (77.1 kg)   SpO2 90%   BMI 25.85 kg/m     Wt Readings from Last 3 Encounters:  07/06/20 170 lb (77.1 kg)  11/08/19 176 lb 12.8 oz (80.2 kg)  10/03/19 165 lb 8 oz (75.1 kg)     GEN: Well nourished, well developed in no acute distress HEENT: Normal NECK: No JVD; No carotid bruits LYMPHATICS: No lymphadenopathy CARDIAC:RRR, no murmurs, rubs, gallops RESPIRATORY:  Clear to auscultation without rales, wheezing or rhonchi  ABDOMEN: Soft, non-tender, non-distended MUSCULOSKELETAL:  1+ bilateral LE edema; No deformity  SKIN: Warm and dry NEUROLOGIC:  Alert and oriented x 3 PSYCHIATRIC:  Normal affect    ASSESSMENT:    1. Chronic diastolic CHF (congestive heart failure) (HCC)   2. Pulmonary hypertension, unspecified (HCC)   3. Atrial flutter, unspecified type (HCC)   4. Primary hypertension   5. Pure hypercholesterolemia   6. Tobacco dependence   7. Paroxysmal  atrial flutter (HCC)   8. Pulmonary HTN (HCC)   9. Pulmonary emphysema, unspecified emphysema type (HCC)    PLAN:    In order of problems listed above:  1.  Chronic diastolic heart failure -[place] EF 55-60%, no WMA, G1DD, moderately reduced RV function, mild LVH, mod AI -Etiology of right heart failure and pulmonary HTN likely related to severe COPD noted on PFTs -he has had a lot of problems with fluid related to dietary indiscretion with Na intake and continues to have intermittent LE edema -  I encouraged him to follow a < 2gm Na diet and cut out all added salt in his deit -continue Lasix to 20mg  qod alt with 40mg  qod -check BMET  2.  Moderate pulmonary HTN  - related to severe COPD and diastolic CHF - PVR borderline at 2.9WU - PFTs  with severe COPD - continue inhalers, O2 and diuretics per pulmonary - he has not followed up with Pulmonary recently so I will get him in to see them ASAP given his O2 sats in office at 90% on RA  3.  Paroxysmal atrial flutter - TSH normal - he is maintaining NSR on exam today and denies any further palpitations - CHADS2VASC score is 2 - continue Eliquis 5mg  BID today - continue on BB - check BMET and CBC today  4.  HTN -BP borderline controlled on exam today but has not taken his BB yet -continue Toprol XL 75mg  daily  5.  HLD -LDL goal < 70 -LDL was 71 in April 2021 -continue Atorvastatin  6.  Tobacco dependence - he has gotten down to 2 cig daily - PFTs c/w severe COPD - O2 sats on exam today are 90% - on home O2 at night followed by Pulmonary - encouraged him to but out tobacco  Followup with me in 6 months   Medication Adjustments/Labs and Tests Ordered: Current medicines are reviewed at length with the patient today.  Concerns regarding medicines are outlined above.  No orders of the defined types were placed in this encounter.  No orders of the defined types were placed in this encounter.   Signed, ,  MD  07/06/2020 10:25 AM    [facility]

## 2020-07-06 NOTE — Addendum Note (Signed)
Addended by: Theresia Majors on: 07/06/2020 10:52 AM   Modules accepted: Orders

## 2020-07-06 NOTE — Patient Instructions (Signed)
Medication Instructions:  Your physician recommends that you continue on your current medications as directed. Please refer to the Current Medication list given to you today.  *If you need a refill on your cardiac medications before your next appointment, please call your pharmacy*   Lab Work: TODAY: BMET and CBC If you have labs (blood work) drawn today and your tests are completely normal, you will receive your results only by: [name] Kitchen MyChart Message (if you have MyChart) OR . A paper copy in the mail If you have any lab test that is abnormal or we need to change your treatment, we will call you to review the results.  Follow-Up: At [facility], you and your health needs are our priority.  As part of our continuing mission to provide you with exceptional heart care, we have created designated Provider Care Teams.  These Care Teams include your primary Cardiologist (physician) and Advanced Practice Providers (APPs -  Physician Assistants and Nurse Practitioners) who all work together to provide you with the care you need, when you need it.  Your next appointment:   6 month(s)  The format for your next appointment:   In Person  Provider:   You may see Armanda Magic, MD or one of the following Advanced Practice Providers on your designated Care Team:    Ronie Spies, PA-C  Jacolyn Reedy, PA-C  Other Instructions  DASH Eating Plan DASH stands for Dietary Approaches to Stop Hypertension. The DASH eating plan is a healthy eating plan that has been shown to:  Reduce high blood pressure (hypertension).  Reduce your risk for type 2 diabetes, heart disease, and stroke.  Help with weight loss. What are tips for following this plan? Reading food labels  Check food labels for the amount of salt (sodium) per serving. Choose foods with less than 5 percent of the Daily Value of sodium. Generally, foods with less than 300 milligrams (mg) of sodium per serving fit into this eating plan.  To  find whole grains, look for the word "whole" as the first word in the ingredient list. Shopping  Buy products labeled as "low-sodium" or "no salt added."  Buy fresh foods. Avoid canned foods and pre-made or frozen meals. Cooking  Avoid adding salt when cooking. Use salt-free seasonings or herbs instead of table salt or sea salt. Check with your health care provider or pharmacist before using salt substitutes.  Do not fry foods. Cook foods using healthy methods such as baking, boiling, grilling, roasting, and broiling instead.  Cook with heart-healthy oils, such as olive, canola, avocado, soybean, or sunflower oil. Meal planning  Eat a balanced diet that includes: ? 4 or more servings of fruits and 4 or more servings of vegetables each day. Try to fill one-half of your plate with fruits and vegetables. ? 6-8 servings of whole grains each day. ? Less than 6 oz (170 g) of lean meat, poultry, or fish each day. A 3-oz (85-g) serving of meat is about the same size as a deck of cards. One egg equals 1 oz (28 g). ? 2-3 servings of low-fat dairy each day. One serving is 1 cup (237 mL). ? 1 serving of nuts, seeds, or beans 5 times each week. ? 2-3 servings of heart-healthy fats. Healthy fats called omega-3 fatty acids are found in foods such as walnuts, flaxseeds, fortified milks, and eggs. These fats are also found in cold-water fish, such as sardines, salmon, and mackerel.  Limit how much you eat of: ?  Canned or prepackaged foods. ? Food that is high in trans fat, such as some fried foods. ? Food that is high in saturated fat, such as fatty meat. ? Desserts and other sweets, sugary drinks, and other foods with added sugar. ? Full-fat dairy products.  Do not salt foods before eating.  Do not eat more than 4 egg yolks a week.  Try to eat at least 2 vegetarian meals a week.  Eat more home-cooked food and less restaurant, buffet, and fast food.   Lifestyle  When eating at a restaurant, ask  that your food be prepared with less salt or no salt, if possible.  If you drink alcohol: ? Limit how much you use to:  0-1 drink a day for women who are not pregnant.  0-2 drinks a day for men. ? Be aware of how much alcohol is in your drink. In the [place] one drink equals one 12 oz bottle of beer (355 mL), one 5 oz glass of wine (148 mL), or one 1 oz glass of hard liquor (44 mL). General information  Avoid eating more than 2,300 mg of salt a day. If you have hypertension, you may need to reduce your sodium intake to 1,500 mg a day.  Work with your health care provider to maintain a healthy body weight or to lose weight. Ask what an ideal weight is for you.  Get at least 30 minutes of exercise that causes your heart to beat faster (aerobic exercise) most days of the week. Activities may include walking, swimming, or biking.  Work with your health care provider or dietitian to adjust your eating plan to your individual calorie needs. What foods should I eat? Fruits All fresh, dried, or frozen fruit. Canned fruit in natural juice (without added sugar). Vegetables Fresh or frozen vegetables (raw, steamed, roasted, or grilled). Low-sodium or reduced-sodium tomato and vegetable juice. Low-sodium or reduced-sodium tomato sauce and tomato paste. Low-sodium or reduced-sodium canned vegetables. Grains Whole-grain or whole-wheat bread. Whole-grain or whole-wheat pasta. Brown rice. Orpah Cobb. Bulgur. Whole-grain and low-sodium cereals. Pita bread. Low-fat, low-sodium crackers. Whole-wheat flour tortillas. Meats and other proteins Skinless chicken or [place]. Ground chicken or [place]. Pork with fat trimmed off. Fish and seafood. Egg whites. Dried beans, peas, or lentils. Unsalted nuts, nut butters, and seeds. Unsalted canned beans. Lean cuts of beef with fat trimmed off. Low-sodium, lean precooked or cured meat, such as sausages or meat loaves. Dairy Low-fat (1%) or fat-free (skim) milk.  Reduced-fat, low-fat, or fat-free cheeses. Nonfat, low-sodium ricotta or cottage cheese. Low-fat or nonfat yogurt. Low-fat, low-sodium cheese. Fats and oils Soft margarine without trans fats. Vegetable oil. Reduced-fat, low-fat, or light mayonnaise and salad dressings (reduced-sodium). Canola, safflower, olive, avocado, soybean, and sunflower oils. Avocado. Seasonings and condiments Herbs. Spices. Seasoning mixes without salt. Other foods Unsalted popcorn and pretzels. Fat-free sweets. The items listed above may not be a complete list of foods and beverages you can eat. Contact a dietitian for more information. What foods should I avoid? Fruits Canned fruit in a light or heavy syrup. Fried fruit. Fruit in cream or butter sauce. Vegetables Creamed or fried vegetables. Vegetables in a cheese sauce. Regular canned vegetables (not low-sodium or reduced-sodium). Regular canned tomato sauce and paste (not low-sodium or reduced-sodium). Regular tomato and vegetable juice (not low-sodium or reduced-sodium). Rosita Fire. Olives. Grains Baked goods made with fat, such as croissants, muffins, or some breads. Dry pasta or rice meal packs. Meats and other proteins Fatty cuts of meat.  Ribs. Foy [place] meat. Tomasa Blase. Bologna, salami, and other precooked or cured meats, such as sausages or meat loaves. Fat from the back of a pig (fatback). Bratwurst. Salted nuts and seeds. Canned beans with added salt. Canned or smoked fish. Whole eggs or egg yolks. Chicken or [place] with skin. Dairy Whole or 2% milk, cream, and half-and-half. Whole or full-fat cream cheese. Whole-fat or sweetened yogurt. Full-fat cheese. Nondairy creamers. Whipped toppings. Processed cheese and cheese spreads. Fats and oils Butter. Stick margarine. Lard. Shortening. Ghee. Bacon fat. Tropical oils, such as coconut, palm kernel, or palm oil. Seasonings and condiments Onion salt, garlic salt, seasoned salt, table salt, and sea salt. Worcestershire sauce.  Tartar sauce. Barbecue sauce. Teriyaki sauce. Soy sauce, including reduced-sodium. Steak sauce. Canned and packaged gravies. Fish sauce. Oyster sauce. Cocktail sauce. Store-bought horseradish. Ketchup. Mustard. Meat flavorings and tenderizers. Bouillon cubes. Hot sauces. Pre-made or packaged marinades. Pre-made or packaged taco seasonings. Relishes. Regular salad dressings. Other foods Salted popcorn and pretzels. The items listed above may not be a complete list of foods and beverages you should avoid. Contact a dietitian for more information. Where to find more information  National Heart, Lung, and Blood Institute: PopSteam.is  American Heart Association: www.heart.org  Academy of Nutrition and Dietetics: www.eatright.org  National Kidney Foundation: www.kidney.org Summary  The DASH eating plan is a healthy eating plan that has been shown to reduce high blood pressure (hypertension). It may also reduce your risk for type 2 diabetes, heart disease, and stroke.  When on the DASH eating plan, aim to eat more fresh fruits and vegetables, whole grains, lean proteins, low-fat dairy, and heart-healthy fats.  With the DASH eating plan, you should limit salt (sodium) intake to 2,300 mg a day. If you have hypertension, you may need to reduce your sodium intake to 1,500 mg a day.  Work with your health care provider or dietitian to adjust your eating plan to your individual calorie needs. This information is not intended to replace advice given to you by your health care provider. Make sure you discuss any questions you have with your health care provider. Document Revised: 05/14/2019 Document Reviewed: 05/14/2019 Elsevier Patient Education  2021 Elsevier Inc.   Low-Sodium Eating Plan Sodium, which is an element that makes up salt, helps you maintain a healthy balance of fluids in your body. Too much sodium can increase your blood pressure and cause fluid and waste to be held in your  body. Your health care provider or dietitian may recommend following this plan if you have high blood pressure (hypertension), kidney disease, liver disease, or heart failure. Eating less sodium can help lower your blood pressure, reduce swelling, and protect your heart, liver, and kidneys. What are tips for following this plan? Reading food labels  The Nutrition Facts label lists the amount of sodium in one serving of the food. If you eat more than one serving, you must multiply the listed amount of sodium by the number of servings.  Choose foods with less than 140 mg of sodium per serving.  Avoid foods with 300 mg of sodium or more per serving. Shopping  Look for lower-sodium products, often labeled as "low-sodium" or "no salt added."  Always check the sodium content, even if foods are labeled as "unsalted" or "no salt added."  Buy fresh foods. ? Avoid canned foods and pre-made or frozen meals. ? Avoid canned, cured, or processed meats.  Buy breads that have less than 80 mg of sodium per slice.  Cooking  Eat more home-cooked food and less restaurant, buffet, and fast food.  Avoid adding salt when cooking. Use salt-free seasonings or herbs instead of table salt or sea salt. Check with your health care provider or pharmacist before using salt substitutes.  Cook with plant-based oils, such as canola, sunflower, or olive oil.   Meal planning  When eating at a restaurant, ask that your food be prepared with less salt or no salt, if possible. Avoid dishes labeled as brined, pickled, cured, smoked, or made with soy sauce, miso, or teriyaki sauce.  Avoid foods that contain MSG (monosodium glutamate). MSG is sometimes added to [place] food, bouillon, and some canned foods.  Make meals that can be grilled, baked, poached, roasted, or steamed. These are generally made with less sodium. General information Most people on this plan should limit their sodium intake to 1,500-2,000 mg  (milligrams) of sodium each day. What foods should I eat? Fruits Fresh, frozen, or canned fruit. Fruit juice. Vegetables Fresh or frozen vegetables. "No salt added" canned vegetables. "No salt added" tomato sauce and paste. Low-sodium or reduced-sodium tomato and vegetable juice. Grains Low-sodium cereals, including oats, puffed wheat and rice, and shredded wheat. Low-sodium crackers. Unsalted rice. Unsalted pasta. Low-sodium bread. Whole-grain breads and whole-grain pasta. Meats and other proteins Fresh or frozen (no salt added) meat, poultry, seafood, and fish. Low-sodium canned tuna and salmon. Unsalted nuts. Dried peas, beans, and lentils without added salt. Unsalted canned beans. Eggs. Unsalted nut butters. Dairy Milk. Soy milk. Cheese that is naturally low in sodium, such as ricotta cheese, fresh mozzarella, or Swiss cheese. Low-sodium or reduced-sodium cheese. Cream cheese. Yogurt. Seasonings and condiments Fresh and dried herbs and spices. Salt-free seasonings. Low-sodium mustard and ketchup. Sodium-free salad dressing. Sodium-free light mayonnaise. Fresh or refrigerated horseradish. Lemon juice. Vinegar. Other foods Homemade, reduced-sodium, or low-sodium soups. Unsalted popcorn and pretzels. Low-salt or salt-free chips. The items listed above may not be a complete list of foods and beverages you can eat. Contact a dietitian for more information. What foods should I avoid? Vegetables Sauerkraut, pickled vegetables, and relishes. Olives. [place] fries. Onion rings. Regular canned vegetables (not low-sodium or reduced-sodium). Regular canned tomato sauce and paste (not low-sodium or reduced-sodium). Regular tomato and vegetable juice (not low-sodium or reduced-sodium). Frozen vegetables in sauces. Grains Instant hot cereals. Bread stuffing, pancake, and biscuit mixes. Croutons. Seasoned rice or pasta mixes. Noodle soup cups. Boxed or frozen macaroni and cheese. Regular salted crackers.  Self-rising flour. Meats and other proteins Meat or fish that is salted, canned, smoked, spiced, or pickled. Precooked or cured meat, such as sausages or meat loaves. Tomasa BlaseBacon. Ham. Pepperoni. Hot dogs. Corned beef. Chipped beef. Salt pork. Jerky. Pickled herring. Anchovies and sardines. Regular canned tuna. Salted nuts. Dairy Processed cheese and cheese spreads. Hard cheeses. Cheese curds. Blue cheese. Feta cheese. String cheese. Regular cottage cheese. Buttermilk. Canned milk. Fats and oils Salted butter. Regular margarine. Ghee. Bacon fat. Seasonings and condiments Onion salt, garlic salt, seasoned salt, table salt, and sea salt. Canned and packaged gravies. Worcestershire sauce. Tartar sauce. Barbecue sauce. Teriyaki sauce. Soy sauce, including reduced-sodium. Steak sauce. Fish sauce. Oyster sauce. Cocktail sauce. Horseradish that you find on the shelf. Regular ketchup and mustard. Meat flavorings and tenderizers. Bouillon cubes. Hot sauce. Pre-made or packaged marinades. Pre-made or packaged taco seasonings. Relishes. Regular salad dressings. Salsa. Other foods Salted popcorn and pretzels. Corn chips and puffs. Potato and tortilla chips. Canned or dried soups. Pizza. Frozen entrees and pot pies. The  items listed above may not be a complete list of foods and beverages you should avoid. Contact a dietitian for more information. Summary  Eating less sodium can help lower your blood pressure, reduce swelling, and protect your heart, liver, and kidneys.  Most people on this plan should limit their sodium intake to 1,500-2,000 mg (milligrams) of sodium each day.  Canned, boxed, and frozen foods are high in sodium. Restaurant foods, fast foods, and pizza are also very high in sodium. You also get sodium by adding salt to food.  Try to cook at home, eat more fresh fruits and vegetables, and eat less fast food and canned, processed, or prepared foods. This information is not intended to replace advice  given to you by your health care provider. Make sure you discuss any questions you have with your health care provider. Document Revised: 07/16/2019 Document Reviewed: 05/12/2019 Elsevier Patient Education  2021 [employer].

## 2020-07-06 NOTE — Addendum Note (Signed)
Addended by: Theresia Majors on: 07/06/2020 10:36 AM   Modules accepted: Orders

## 2020-07-07 LAB — CBC
Hematocrit: 43 % (ref 37.5–51.0)
Hemoglobin: 14.3 g/dL (ref 13.0–17.7)
MCH: 31.1 pg (ref 26.6–33.0)
MCHC: 33.3 g/dL (ref 31.5–35.7)
MCV: 94 fL (ref 79–97)
Platelets: 165 10*3/uL (ref 150–450)
RBC: 4.6 x10E6/uL (ref 4.14–5.80)
RDW: 14.8 % (ref 11.6–15.4)
WBC: 5.8 10*3/uL (ref 3.4–10.8)

## 2020-07-07 LAB — BASIC METABOLIC PANEL
BUN/Creatinine Ratio: 13 (ref 10–24)
BUN: 19 mg/dL (ref 8–27)
CO2: 32 mmol/L — ABNORMAL HIGH (ref 20–29)
Calcium: 9 mg/dL (ref 8.6–10.2)
Chloride: 95 mmol/L — ABNORMAL LOW (ref 96–106)
Creatinine, Ser: 1.43 mg/dL — ABNORMAL HIGH (ref 0.76–1.27)
GFR calc Af Amer: 59 mL/min/{1.73_m2} — ABNORMAL LOW (ref >59)
GFR calc non Af Amer: 51 mL/min/{1.73_m2} — ABNORMAL LOW (ref >59)
Glucose: 95 mg/dL (ref 65–99)
Potassium: 4.6 mmol/L (ref 3.5–5.2)
Sodium: 140 mmol/L (ref 134–144)

## 2020-07-31 ENCOUNTER — Institutional Professional Consult (permissible substitution): Payer: Medicare Other | Admitting: Pulmonary Disease

## 2020-08-07 ENCOUNTER — Telehealth: Payer: Self-pay | Admitting: *Deleted

## 2020-08-07 NOTE — Telephone Encounter (Signed)
   [facility] Pre-operative Risk Assessment    HEARTCARE STAFF: - Please ensure there is not already an duplicate clearance open for this procedure. - Under Visit Info/Reason for Call, type in Other and utilize the format Clearance MM/DD/YY or Clearance TBD. Do not use dashes or single digits. - If request is for dental extraction, please clarify the # of teeth to be extracted.  Request for surgical clearance:  1. What type of surgery is being performed? EGD/colonoscopy   2. When is this surgery scheduled? 09/04/2020   3. What type of clearance is required (medical clearance vs. Pharmacy clearance to hold med vs. Both)? Both  4. Are there any medications that need to be held prior to surgery and how long?Eliquis, 2 days prior   5. Practice name and name of physician performing surgery? [facility]   6. What is the office phone number? [phone]   7.   What is the office fax number? [phone]  8.   Anesthesia type (None, local, MAC, general) ? Not listed   Juventino Slovak 08/07/2020, 4:18 PM  _________________________________________________________________   (provider comments below)

## 2020-08-08 NOTE — Telephone Encounter (Signed)
Will route to PharmD for rec's re: holding anticoagulation. Tereso Newcomer, PA-C    08/08/2020 2:43 PM

## 2020-08-08 NOTE — Telephone Encounter (Signed)
Patient with diagnosis of atrial fibrillation on Eliquis for anticoagulation.    Procedure: EGD/colonoscopy Date of procedure: 09/04/20   CHA2DS2-VASc Score = 3  This indicates a 3.2% annual risk of stroke. The patient's score is based upon: CHF History: Yes HTN History: Yes Diabetes History: No Stroke History: No Vascular Disease History: No Age Score: 1 Gender Score: 0  CrCl 55.4 Platelet count 116  Per office protocol, patient can hold Eliquis for 2 days prior to procedure.    Patient will not need bridging with Lovenox (enoxaparin) around procedure.

## 2020-08-11 NOTE — Telephone Encounter (Signed)
   Primary Cardiologist: Armanda Magic, MD  Chart reviewed as part of pre-operative protocol coverage. Patient was contacted 08/11/2020 in reference to pre-operative risk assessment for pending surgery as outlined below.  Aaron Fuller was last seen on 07/06/20 by Dr. Mayford Knife.  Since that day, Aaron Fuller has done well.  Therefore, based on ACC/AHA guidelines, the patient would be at acceptable risk for the planned procedure without further cardiovascular testing.   Per PharmD review he may hold Eliquis 2 days prior to planned procedure. Patient was made aware of these recommendations.  The patient was advised that if he develops new symptoms prior to surgery to contact our office to arrange for a follow-up visit, and he verbalized understanding.  I will route this recommendation to the requesting party via Epic fax function and remove from pre-op pool. Please call with questions.  Alver Sorrow, NP 08/11/2020, 9:18 AM

## 2020-08-23 ENCOUNTER — Other Ambulatory Visit: Payer: Self-pay

## 2020-08-23 ENCOUNTER — Ambulatory Visit (INDEPENDENT_AMBULATORY_CARE_PROVIDER_SITE_OTHER): Payer: Medicare Other | Admitting: Pulmonary Disease

## 2020-08-23 ENCOUNTER — Encounter: Payer: Self-pay | Admitting: Pulmonary Disease

## 2020-08-23 VITALS — BP 134/90 | HR 104 | Temp 97.7°F | Ht 68.5 in | Wt 171.2 lb

## 2020-08-23 DIAGNOSIS — I272 Pulmonary hypertension, unspecified: Secondary | ICD-10-CM | POA: Diagnosis not present

## 2020-08-23 DIAGNOSIS — J439 Emphysema, unspecified: Secondary | ICD-10-CM | POA: Diagnosis not present

## 2020-08-23 DIAGNOSIS — G4734 Idiopathic sleep related nonobstructive alveolar hypoventilation: Secondary | ICD-10-CM | POA: Diagnosis not present

## 2020-08-23 MED ORDER — SPIRIVA RESPIMAT 2.5 MCG/ACT IN AERS: 2.0000 | INHALATION_SPRAY | Freq: Every day | RESPIRATORY_TRACT | 6 refills | Status: DC

## 2020-08-23 NOTE — Patient Instructions (Addendum)
Use nicotine patch, 14mg  daily and can step down to 7mg  patch per day once off cigarettes.   Use nicotine lozenges or gum for break through cigarettes cravings.   Start Spiriva Respimat 2.27mcg 2 puffs once daily  Use albuterol 1-2 puffs as needed every 4-6 hours for shortness of breath, wheezing, cough or chest tightness.

## 2020-08-23 NOTE — Progress Notes (Signed)
Synopsis: Referred in March 2022 by Armanda Magic, MD for COPD  Subjective:   PATIENT ID: Aaron Fuller GENDER: male DOB: [birth year], MRN: [id number]   HPI  Chief Complaint  Patient presents with  . Consult    Recently dx with COPD.  Does have some SHOB with exertion.     Aaron Fuller is a 65 year old male, daily smoker with paroxysmal atrial flutter, heart failure preserved EF, pulmonary hypertension and COPD who is referred to pulmonary clinic for COPD.   He has severe obstructive defect FEV1 0.58L (20%) on PFTs on 09/30/19 during an acute hospitalization for his breathing. He continues to smoke 0.5 pack per day which is down from 2 packs per day. He tried nicotine patches after his admission last year which helped him cut down. He reports he is stuck at half a packer per day. He has not tried nicotine gum or lozenges in the past.   He is using 2L of oxygen at night. On simple walk in the clinic today, he desaturates to 85% on room air.   He is not very active but reports shortness of breath walking in from the parking lot today.   He does not report issues with cough or wheezing.   Past Medical History:  Diagnosis Date  . Alcohol dependence (HCC)   . Chronic diastolic CHF (congestive heart failure) (HCC)    normal coronary arteries on cath  . COPD (chronic obstructive pulmonary disease) (HCC)   . Hypertension   . Paroxysmal atrial flutter (HCC)   . Pulmonary HTN (HCC)    secondary to COPD  . Tobacco dependence      Family History  Problem Relation Age of Onset  . CAD Mother   . Diabetes Mother   . Heart failure Mother   . CAD Father   . Diabetes Sister      Social History   Socioeconomic History  . Marital status: Single    Spouse name: Not on file  . Number of children: Not on file  . Years of education: Not on file  . Highest education level: Not on file  Occupational History  . Occupation: retired Investment banker, operational  Tobacco Use  . Smoking status: Current Every Day Smoker     Packs/day: 1.00    Years: 49.00    Pack years: 49.00  . Smokeless tobacco: Never Used  . Tobacco comment: currently 0.5  Substance and Sexual Activity  . Alcohol use: Yes    Comment: drinks 1 pint a day, last drink 2-3 days ago  . Drug use: Never  . Sexual activity: Not on file  Other Topics Concern  . Not on file  Social History Narrative  . Not on file   Social Determinants of Health   Financial Resource Strain: Not on file  Food Insecurity: Not on file  Transportation Needs: Not on file  Physical Activity: Not on file  Stress: Not on file  Social Connections: Not on file  Intimate Partner Violence: Not on file     Allergies  Allergen Reactions  . Latex Rash     Outpatient Medications Prior to Visit  Medication Sig Dispense Refill  . albuterol (VENTOLIN HFA) 108 (90 Base) MCG/ACT inhaler Inhale 2 puffs into the lungs every 6 (six) hours as needed for wheezing or shortness of breath. 18 g 0  . apixaban (ELIQUIS) 5 MG TABS tablet Take 1 tablet (5 mg total) by mouth 2 (two) times daily. 180 tablet 1  .  atorvastatin (LIPITOR) 20 MG tablet Take 1 tablet (20 mg total) by mouth daily at 6 PM. 90 tablet 3  . diphenhydrAMINE (BENADRYL) 25 mg capsule Take 25 mg by mouth every 6 (six) hours as needed (for grass/pollen-induced allergic symptoms).     . folic acid (FOLVITE) 1 MG tablet Take 1 tablet (1 mg total) by mouth daily. 30 tablet 0  . furosemide (LASIX) 20 MG tablet Take 1 tablet (20 mg total) by mouth daily. (Patient taking differently: Take 20 mg by mouth daily. Take 1 tablet (20 mg total) every other day, alternating with 2 tablets (40 mg total) every other day) 90 tablet 3  . metoprolol succinate (TOPROL XL) 50 MG 24 hr tablet Take 1.5 tablets (75 mg total) by mouth daily. Take with or immediately following a meal. 135 tablet 3  . Multiple Vitamin (MULTIVITAMIN WITH MINERALS) TABS tablet Take 1 tablet by mouth daily.    . nicotine (NICODERM CQ - DOSED IN MG/24 HOURS) 14  mg/24hr patch Place 1 patch (14 mg total) onto the skin daily. 28 patch 0  . thiamine 100 MG tablet Take 1 tablet (100 mg total) by mouth daily. 30 tablet 0   No facility-administered medications prior to visit.    Review of Systems  Constitutional: Negative for chills, fever, malaise/fatigue and weight loss.  HENT: Negative for congestion, sinus pain and sore throat.   Eyes: Negative.   Respiratory: Positive for shortness of breath. Negative for cough, hemoptysis, sputum production and wheezing.   Cardiovascular: Negative for chest pain, palpitations, orthopnea, claudication and leg swelling.  Gastrointestinal: Negative for abdominal pain, heartburn, nausea and vomiting.  Genitourinary: Negative.   Musculoskeletal: Negative for joint pain and myalgias.  Skin: Negative for rash.  Neurological: Negative for weakness.  Endo/Heme/Allergies: Negative.   Psychiatric/Behavioral: Negative.       Objective:   Vitals:   08/23/20 1034  BP: 134/90  Pulse: (!) 104  Temp: 97.7 F (36.5 C)  TempSrc: Tympanic  SpO2: (!) 88%  Weight: 171 lb 4 oz (77.7 kg)  Height: 5' 8.5" (1.74 m)     Physical Exam Constitutional:      General: He is not in acute distress.    Appearance: He is not ill-appearing.  HENT:     Head: Normocephalic and atraumatic.  Eyes:     Extraocular Movements: Extraocular movements intact.     Conjunctiva/sclera: Conjunctivae normal.     Pupils: Pupils are equal, round, and reactive to light.  Cardiovascular:     Rate and Rhythm: Normal rate and regular rhythm.     Pulses: Normal pulses.     Heart sounds: Normal heart sounds. No murmur heard.   Pulmonary:     Effort: Pulmonary effort is normal.     Breath sounds: Decreased air movement present. No wheezing, rhonchi or rales.  Abdominal:     General: Bowel sounds are normal.     Palpations: Abdomen is soft.  Musculoskeletal:     Right lower leg: No edema.     Left lower leg: No edema.  Lymphadenopathy:      Cervical: No cervical adenopathy.  Skin:    General: Skin is warm and dry.  Neurological:     General: No focal deficit present.     Mental Status: He is alert.  Psychiatric:        Mood and Affect: Mood normal.        Behavior: Behavior normal.        Thought Content:  Thought content normal.        Judgment: Judgment normal.    CBC    Component Value Date/Time   WBC 5.8 07/06/2020 1039   WBC 5.7 10/02/2019 0359   RBC 4.60 07/06/2020 1039   RBC 4.03 (L) 10/02/2019 0359   HGB 14.3 07/06/2020 1039   HCT 43.0 07/06/2020 1039   PLT 165 07/06/2020 1039   MCV 94 07/06/2020 1039   MCH 31.1 07/06/2020 1039   MCH 33.3 10/02/2019 0359   MCHC 33.3 07/06/2020 1039   MCHC 34.6 10/02/2019 0359   RDW 14.8 07/06/2020 1039   LYMPHSABS 1.5 09/29/2019 0001   MONOABS 0.9 09/29/2019 0001   EOSABS 0.1 09/29/2019 0001   BASOSABS 0.0 09/29/2019 0001   BMP Latest Ref Rng & Units 07/06/2020 11/08/2019 10/06/2019  Glucose 65 - 99 mg/dL 95 87 77  BUN 8 - 27 mg/dL 19 15 17   Creatinine 0.76 - 1.27 mg/dL ) 8.56(D) 1.49(F  BUN/Creat Ratio 10 - 24 13 10 13   Sodium 134 - 144 mmol/L 140 142 144  Potassium 3.5 - 5.2 mmol/L 4.6 4.3 4.6  Chloride 96 - 106 mmol/L 95(L) 93(L) 98  CO2 20 - 29 mmol/L 32(H) 33(H) 39(H)  Calcium 8.6 - 10.2 mg/dL 9.0 9.4 9.5   Chest imaging: CTA Chest 09/27/19 1. No evidence for pulmonary embolus. 2. Moderate emphysematous changes in the lungs. 3. 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  PFT: PFT Results Latest Ref Rng & Units 09/30/2019  FVC-Pre L 1.43  FVC-Predicted Pre % 37  Pre FEV1/FVC % % 41  FEV1-Pre L 0.58  FEV1-Predicted Pre % 20    Echo 09/28/19: 1. Left ventricular ejection fraction, by estimation, is 55 to 60%. The  left ventricle has normal function. The left ventricle has no regional  wall motion abnormalities. There is mild  concentric left ventricular  hypertrophy. Left ventricular diastolic  parameters are consistent with Grade I diastolic dysfunction (impaired  relaxation).  2. Right ventricular systolic function is moderately reduced. The right  ventricular size is moderately enlarged. There is moderately elevated  pulmonary artery systolic pressure. The estimated right ventricular  systolic pressure is 54.7 mmHg.  3. Right atrial size was moderately dilated.  4. The mitral valve is normal in structure. No evidence of mitral valve  regurgitation.  5. The aortic valve is tricuspid. Aortic valve regurgitation is moderate.  No aortic stenosis is present.  6. The inferior vena cava is dilated in size with <50% respiratory  variability, suggesting right atrial pressure of 15 mmHg.  Heart Catheterization 09/29/19: Moderately elevated right heart pressures with PA pressure 63/28 and mean pressure 39 mmHg consistent with at least moderate pulmonary hypertension.  PVR: 2.9 WU  LVEDP 17 - 18 mmHg.  Assessment & Plan:   Pulmonary emphysema, unspecified emphysema type (HCC) - Plan: Split night study, Tiotropium Bromide Monohydrate (SPIRIVA RESPIMAT) 2.5 MCG/ACT AERS, AMB REFERRAL FOR DME  Nocturnal hypoxemia - Plan: Split night study  Pulmonary hypertension, unspecified (HCC) - Plan: Split night study, AMB REFERRAL FOR DME  Discussion: Aaron Fuller is a 65 year old male, daily smoker with paroxysmal atrial flutter, heart failure preserved EF, pulmonary hypertension and COPD who is referred to pulmonary clinic for COPD.   He has severe obstructive defect on spirometry with suggestion of restrictive pattern as well.   We will start him on spiriva respimat 2.53mcg  2 puffs daily and as needed albuterol.   He has pulmonary hypertension in setting of chronic respiratory failure and emphysema.   We will check a split night sleep study to determine if he would benefit from CPAP or Bipap rather than supplemental  oxygen alone.  He should be wearing 2L of oxygen with ambulation or exertion based on today's simple walk with oxygen desaturations to 85% on room air.   Discussed smoking cessation and trying to use the patches again and also incorporating nicotine gum or lozenges that are over the counter.   He does qualify for lung cancer screening and will be due for a CT scan this September. We will refer him to our lung cancer screening program at the next visit.  Melody Comas, MD [facility] Office: [phone]   See Amion for Pager Details    Current Outpatient Medications:  .  albuterol (VENTOLIN HFA) 108 (90 Base) MCG/ACT inhaler, Inhale 2 puffs into the lungs every 6 (six) hours as needed for wheezing or shortness of breath., Disp: 18 g, Rfl: 0 .  apixaban (ELIQUIS) 5 MG TABS tablet, Take 1 tablet (5 mg total) by mouth 2 (two) times daily., Disp: 180 tablet, Rfl: 1 .  atorvastatin (LIPITOR) 20 MG tablet, Take 1 tablet (20 mg total) by mouth daily at 6 PM., Disp: 90 tablet, Rfl: 3 .  diphenhydrAMINE (BENADRYL) 25 mg capsule, Take 25 mg by mouth every 6 (six) hours as needed (for grass/pollen-induced allergic symptoms). , Disp: , Rfl:  .  folic acid (FOLVITE) 1 MG tablet, Take 1 tablet (1 mg total) by mouth daily., Disp: 30 tablet, Rfl: 0 .  furosemide (LASIX) 20 MG tablet, Take 1 tablet (20 mg total) by mouth daily. (Patient taking differently: Take 20 mg by mouth daily. Take 1 tablet (20 mg total) every other day, alternating with 2 tablets (40 mg total) every other day), Disp: 90 tablet, Rfl: 3 .  metoprolol succinate (TOPROL XL) 50 MG 24 hr tablet, Take 1.5 tablets (75 mg total) by mouth daily. Take with or immediately following a meal., Disp: 135 tablet, Rfl: 3 .  Multiple Vitamin (MULTIVITAMIN WITH MINERALS) TABS tablet, Take 1 tablet by mouth daily., Disp:  , Rfl:  .  nicotine (NICODERM CQ - DOSED IN MG/24 HOURS) 14 mg/24hr patch, Place 1 patch (14 mg total) onto the  skin daily., Disp: 28 patch, Rfl: 0 .  thiamine 100 MG tablet, Take 1 tablet (100 mg total) by mouth daily., Disp: 30 tablet, Rfl: 0 .  Tiotropium Bromide Monohydrate (SPIRIVA RESPIMAT) 2.5 MCG/ACT AERS, Inhale 2 puffs into the lungs daily., Disp: 4 g, Rfl: 6

## 2020-10-04 ENCOUNTER — Ambulatory Visit (HOSPITAL_BASED_OUTPATIENT_CLINIC_OR_DEPARTMENT_OTHER): Payer: Medicare Other | Attending: Pulmonary Disease | Admitting: Pulmonary Disease

## 2021-02-12 ENCOUNTER — Telehealth: Payer: Self-pay | Admitting: *Deleted

## 2021-02-12 NOTE — Telephone Encounter (Signed)
   [facility] Pre-operative Risk Assessment    Patient Name: Aaron Fuller  DOB: 02/13/1956 MRN: [id number]  HEARTCARE STAFF:  - IMPORTANT!!!!!! Under Visit Info/Reason for Call, type in Other and utilize the format Clearance MM/DD/YY or Clearance TBD. Do not use dashes or single digits. - Please review there is not already an duplicate clearance open for this procedure. - If request is for dental extraction, please clarify the # of teeth to be extracted. - If the patient is currently at the dentist's office, call Pre-Op Callback Staff (MA/nurse) to input urgent request.  - If the patient is not currently in the dentist office, please route to the Pre-Op pool.  Request for surgical clearance:  What type of surgery is being performed? ENDOSCOPY AND COLONOSCOPY  When is this surgery scheduled? TBD  What type of clearance is required (medical clearance vs. Pharmacy clearance to hold med vs. Both)? BOTH  Are there any medications that need to be held prior to surgery and how long?  ELIQUIS x 4 DAYS PER CLEARANCE REQUEST  Practice name and name of physician performing surgery? [facility]; MD NOT LISTED  What is the office phone number? [phone]   7.   What is the office fax number? [phone]  8.   Anesthesia type (None, local, MAC, general) ? PROPOFOL    Julaine Hua 02/12/2021, 12:58 PM  _________________________________________________________________   (provider comments below)

## 2021-02-12 NOTE — Telephone Encounter (Signed)
   Name: Aaron Fuller  DOB: Sep 04, 1955  MRN: [id number]  Primary Cardiologist: Armanda Magic, MD  Chart reviewed as part of pre-operative protocol coverage. Because of [name] past medical history and time since last visit, he will require a follow-up visit in order to better assess preoperative cardiovascular risk.  At last OV in 06/2020 he was having issues with intermittent edema and also borderline O2 saturations and elevated BP. He was recommended to follow-up with pulmonology at that time. It was also recommended to f/u with our team in 12/2020 but this appt has not yet occurred and is not yet scheduled.  Pre-op covering staff: - Please schedule appointment and call patient to inform them.  - Please contact requesting surgeon's office via preferred method (i.e, phone, fax) to inform them of need for appointment prior to surgery.  Pharm team previously addressed anticoagulation clearance in 07/2020 telephone note which should suffice unless other clinical changes in the meantime identified at OV.  Laurann Montana, PA-C  02/12/2021, 3:24 PM

## 2021-02-12 NOTE — Telephone Encounter (Signed)
Left message for pt to call back and schedule an appt for pre op clearance. Appt with Dr. Mayford Knife or any APP is fine.

## 2021-02-14 NOTE — Telephone Encounter (Signed)
2nd attempt to reach pt to schedule appt. Lvm

## 2021-02-16 NOTE — Telephone Encounter (Signed)
Left message x 3 for ptcb to schedule an appt for pre op clearance. Pt can see Dr. Mayford Knife or any APP.

## 2021-02-20 NOTE — Telephone Encounter (Signed)
I was able to reach pt today and schedule him a pre op appt with Dr. Mayford Knife 02/27/21 @ 1:30. I will send FYI to requesting office pt has appt 02/27/21.

## 2021-02-27 ENCOUNTER — Ambulatory Visit: Payer: Medicare Other | Admitting: Cardiology

## 2021-02-28 IMAGING — US US RENAL
1 series · 14 of 25 positions shown · non-contrast
Comparison: None only the upper portion of the kidneys are imaged
on a CT a of the chest.

CLINICAL DATA: Acute kidney injury.

EXAM:
RENAL / URINARY TRACT ULTRASOUND COMPLETE

[Series 1: us renal · 14 of 29 slices shown]
[im 1/29]
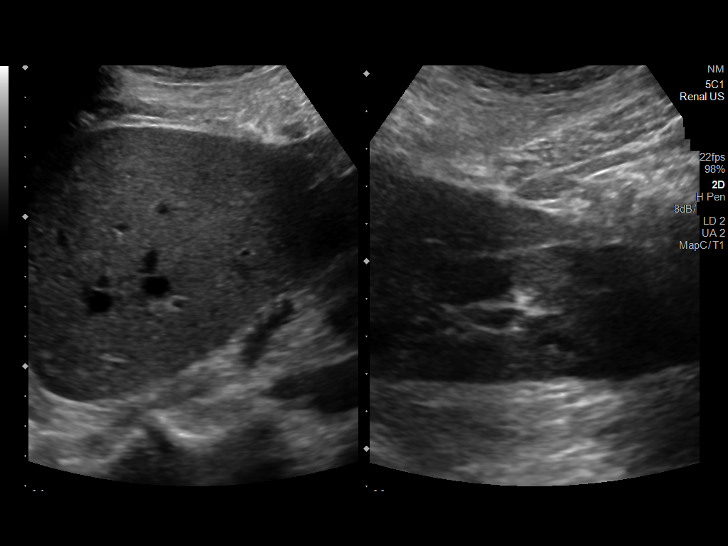
[im 3/29]
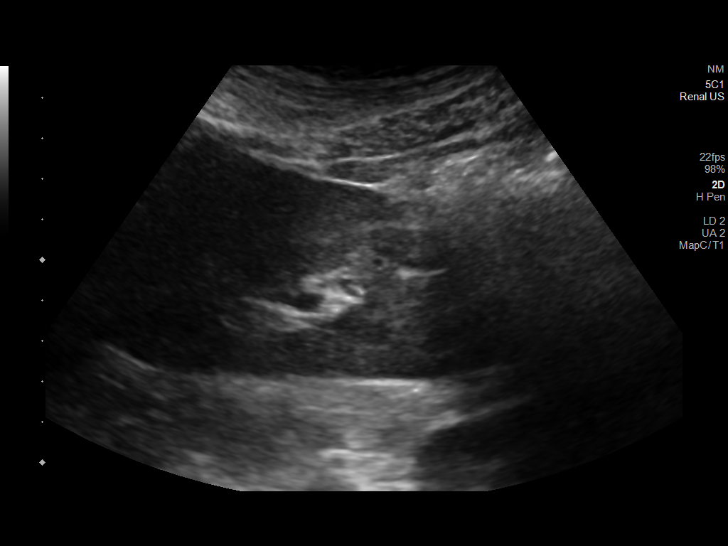
[im 5/29]
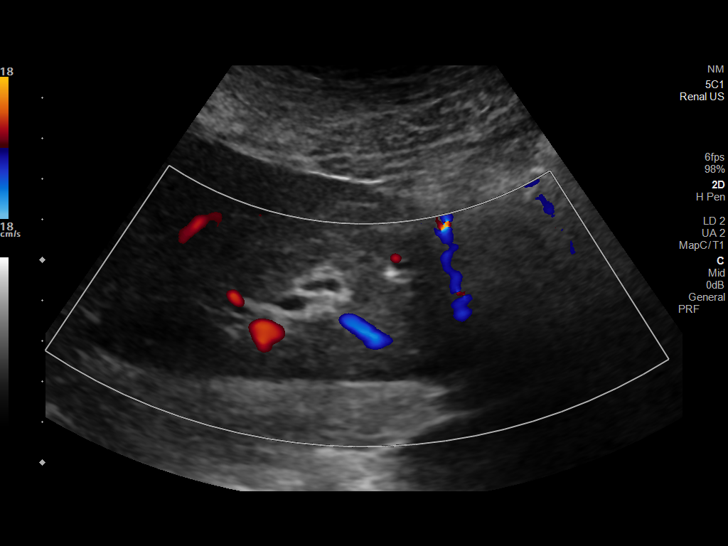
[im 8/29]
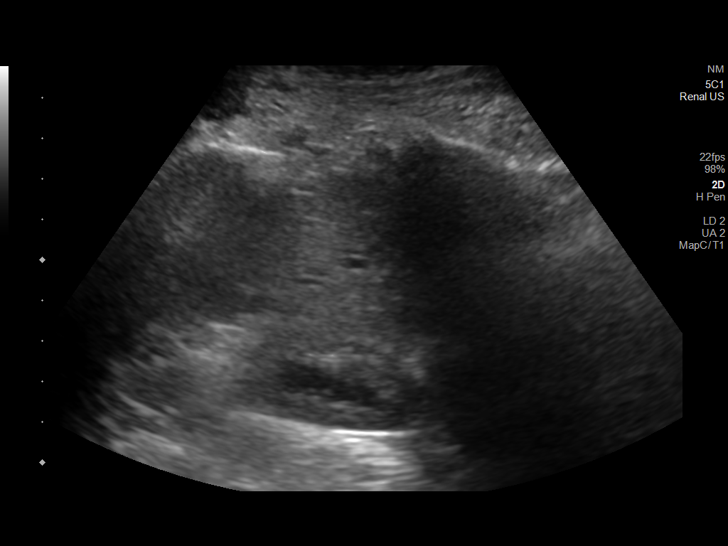
[im 10/29]
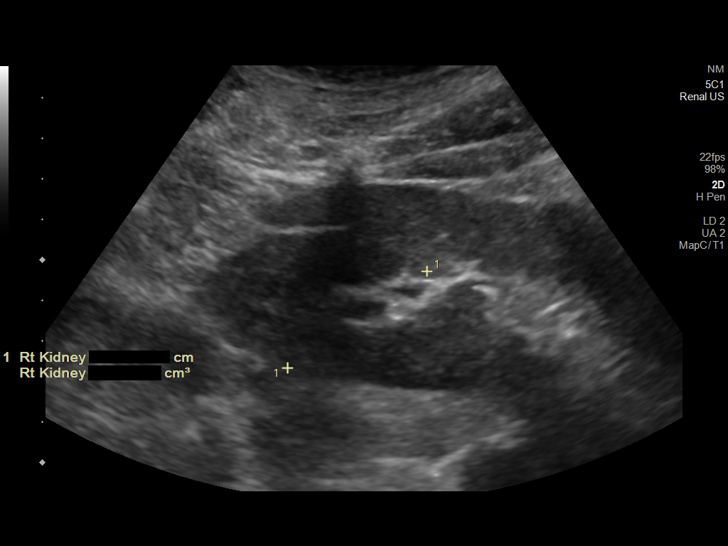
[im 11/29]
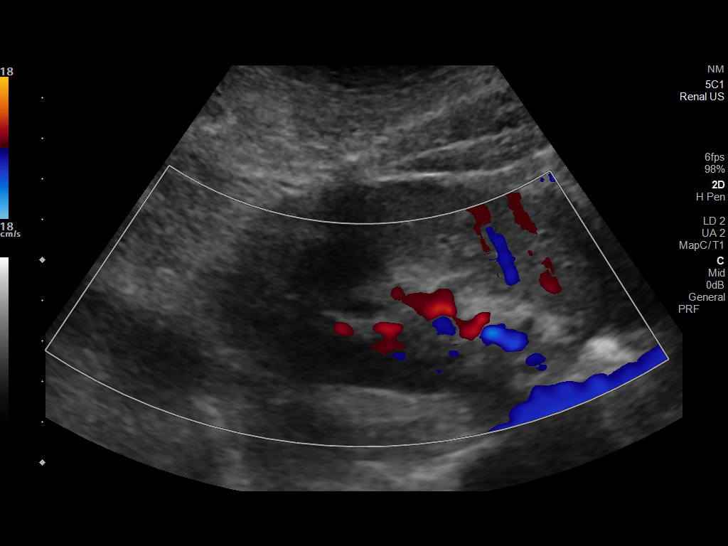
[im 13/29]
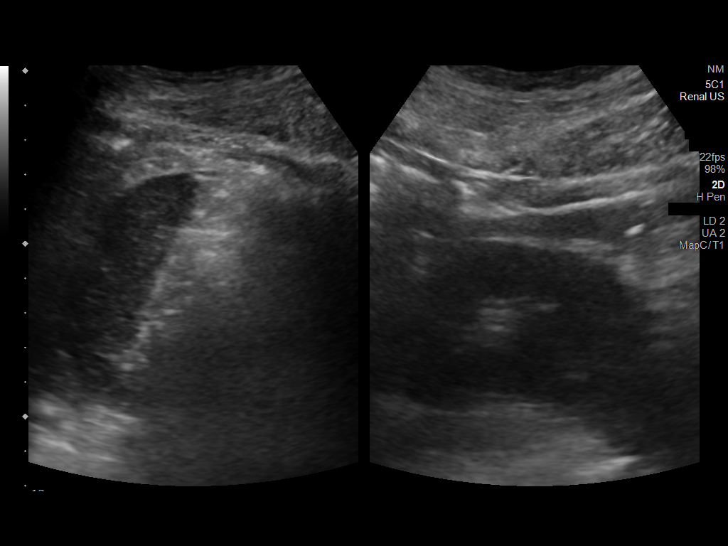
[im 16/29]
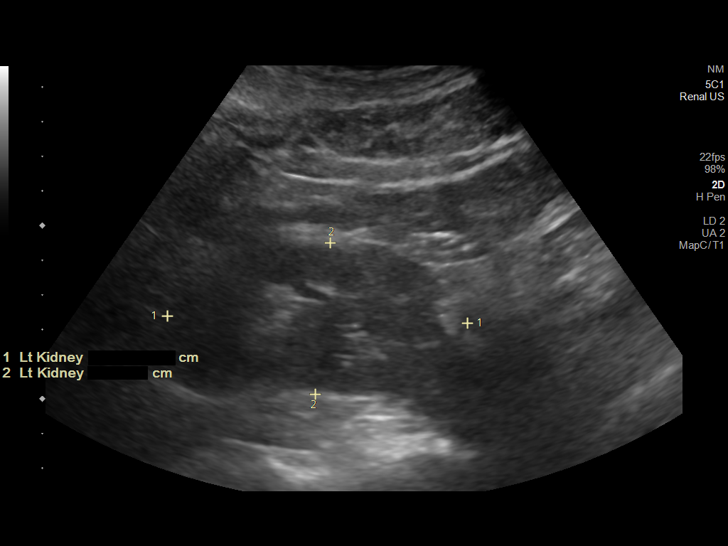
[im 18/29]
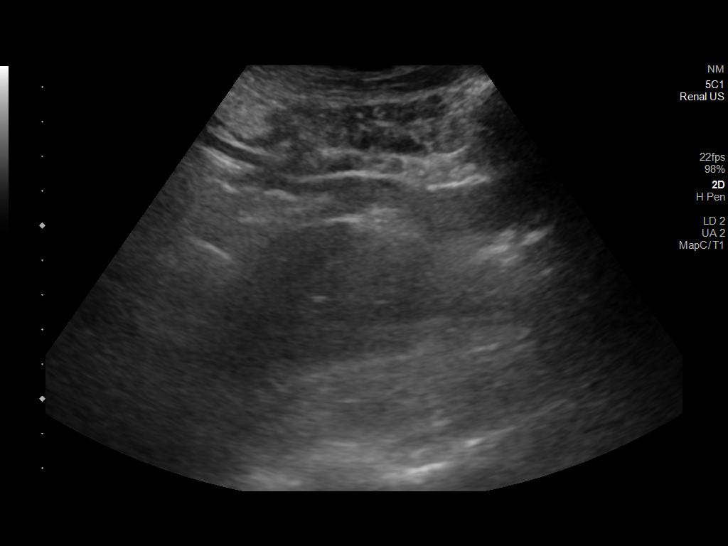
[im 19/29]
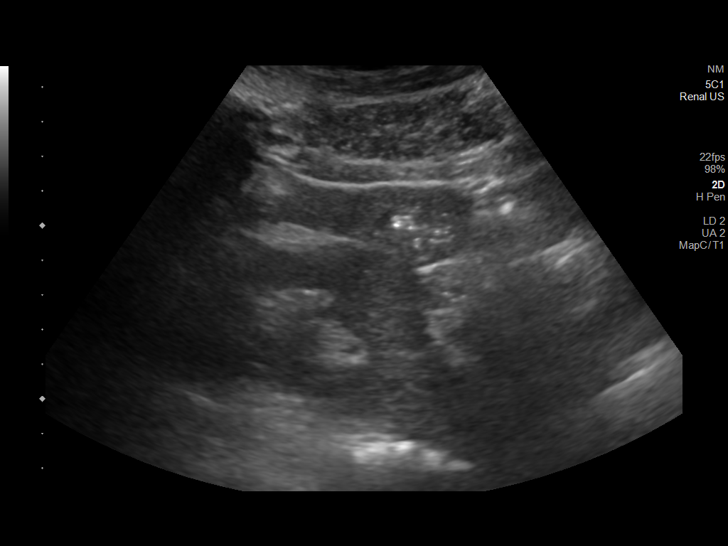
[im 22/29]
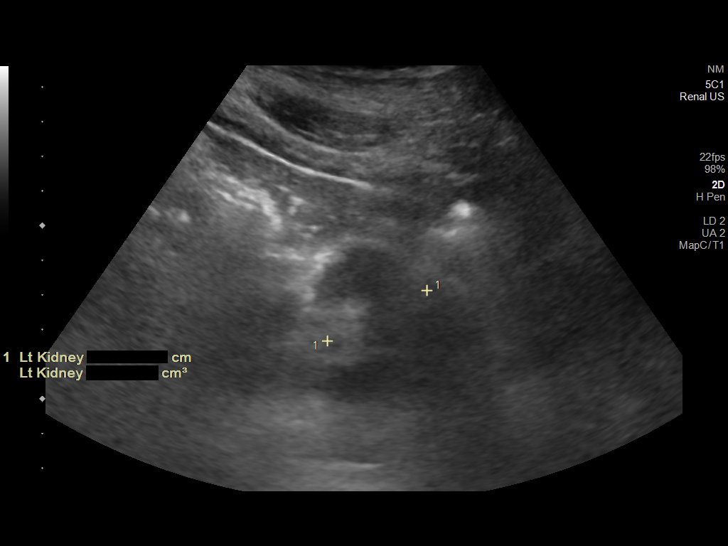
[im 24/29]
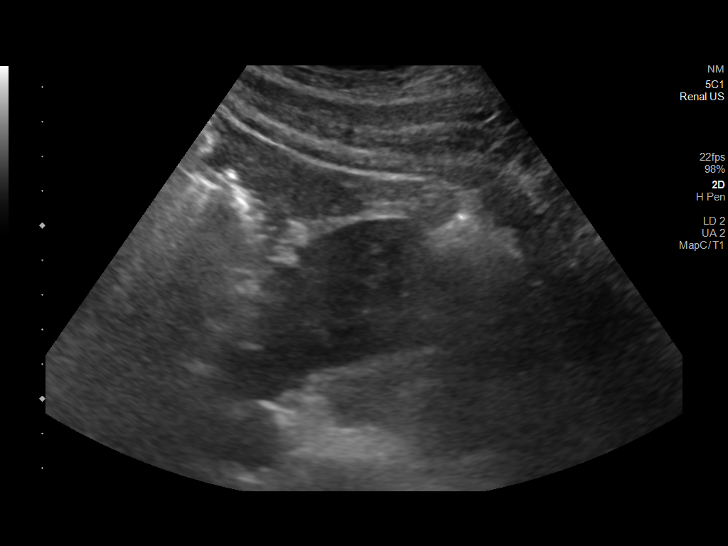
[im 26/29]
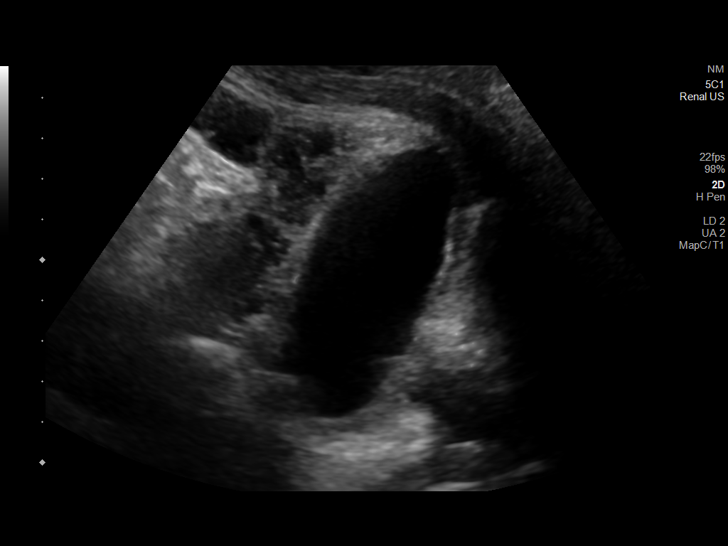
[im 29/29]
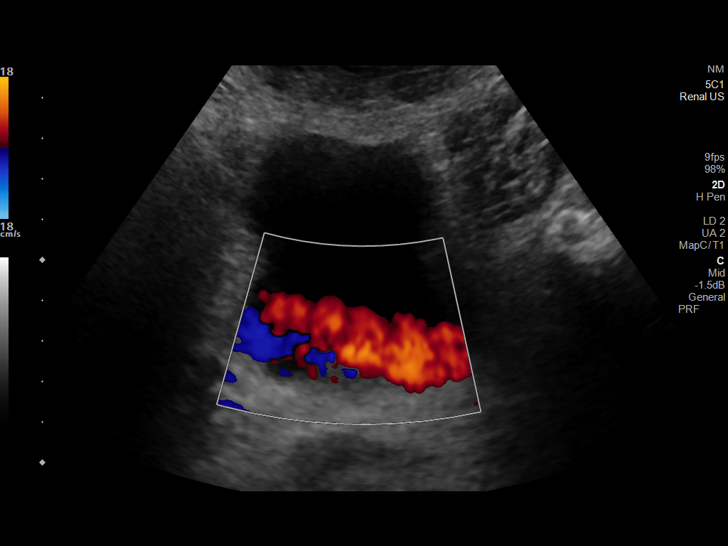

[14 of 25 positions shown; findings below may reference images not displayed]

FINDINGS: Right Kidney:

Renal measurements: 10.0 x 3.9 x 4.2 cm = volume: 86 mL. Poor
corticomedullary differentiation. No sign of hydronephrosis. Limited
assessment perhaps due to body wall edema.

Left Kidney:

Renal measurements: 8.7 x 4.4 x 3.2 cm = volume: 64 ML. Poor
corticomedullary differentiation. Limited assessment of the left
kidney secondary to overlying bowel. No gross hydronephrosis.

Bladder:

Appears normal for degree of bladder distention.

Other:

None.
IMPRESSION: Findings could be seen in the setting of medical renal disease with
poor corticomedullary differentiation. No hydronephrosis.

Study limited by body wall edema and overlying bowel gas.

## 2021-02-28 IMAGING — DX DG CHEST 2V
2 series · 2 of 2 positions shown · non-contrast
Comparison: 09/27/2019

CLINICAL DATA: Dyspnea

EXAM:
CHEST - 2 VIEW

[w chest pa]
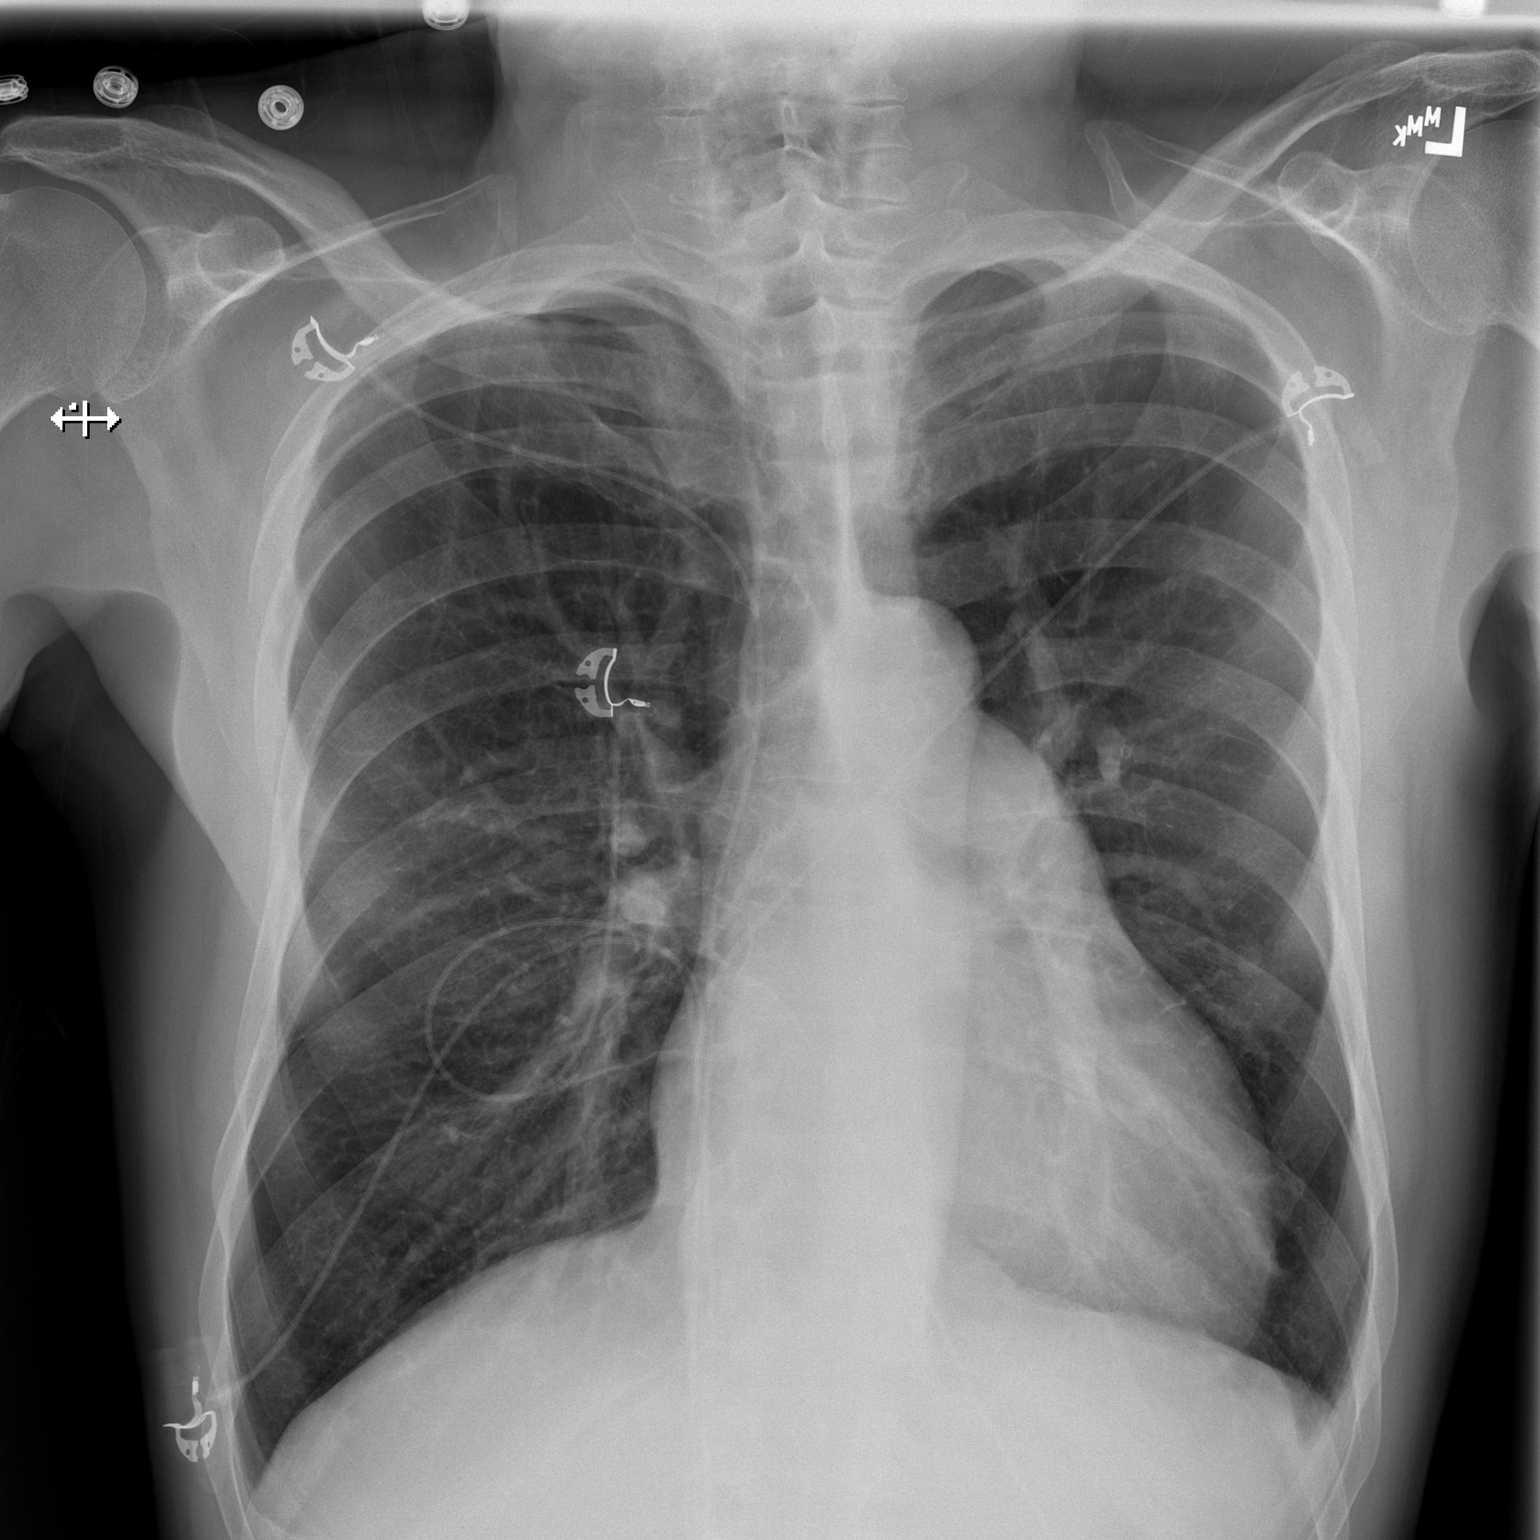

[w chest lat]
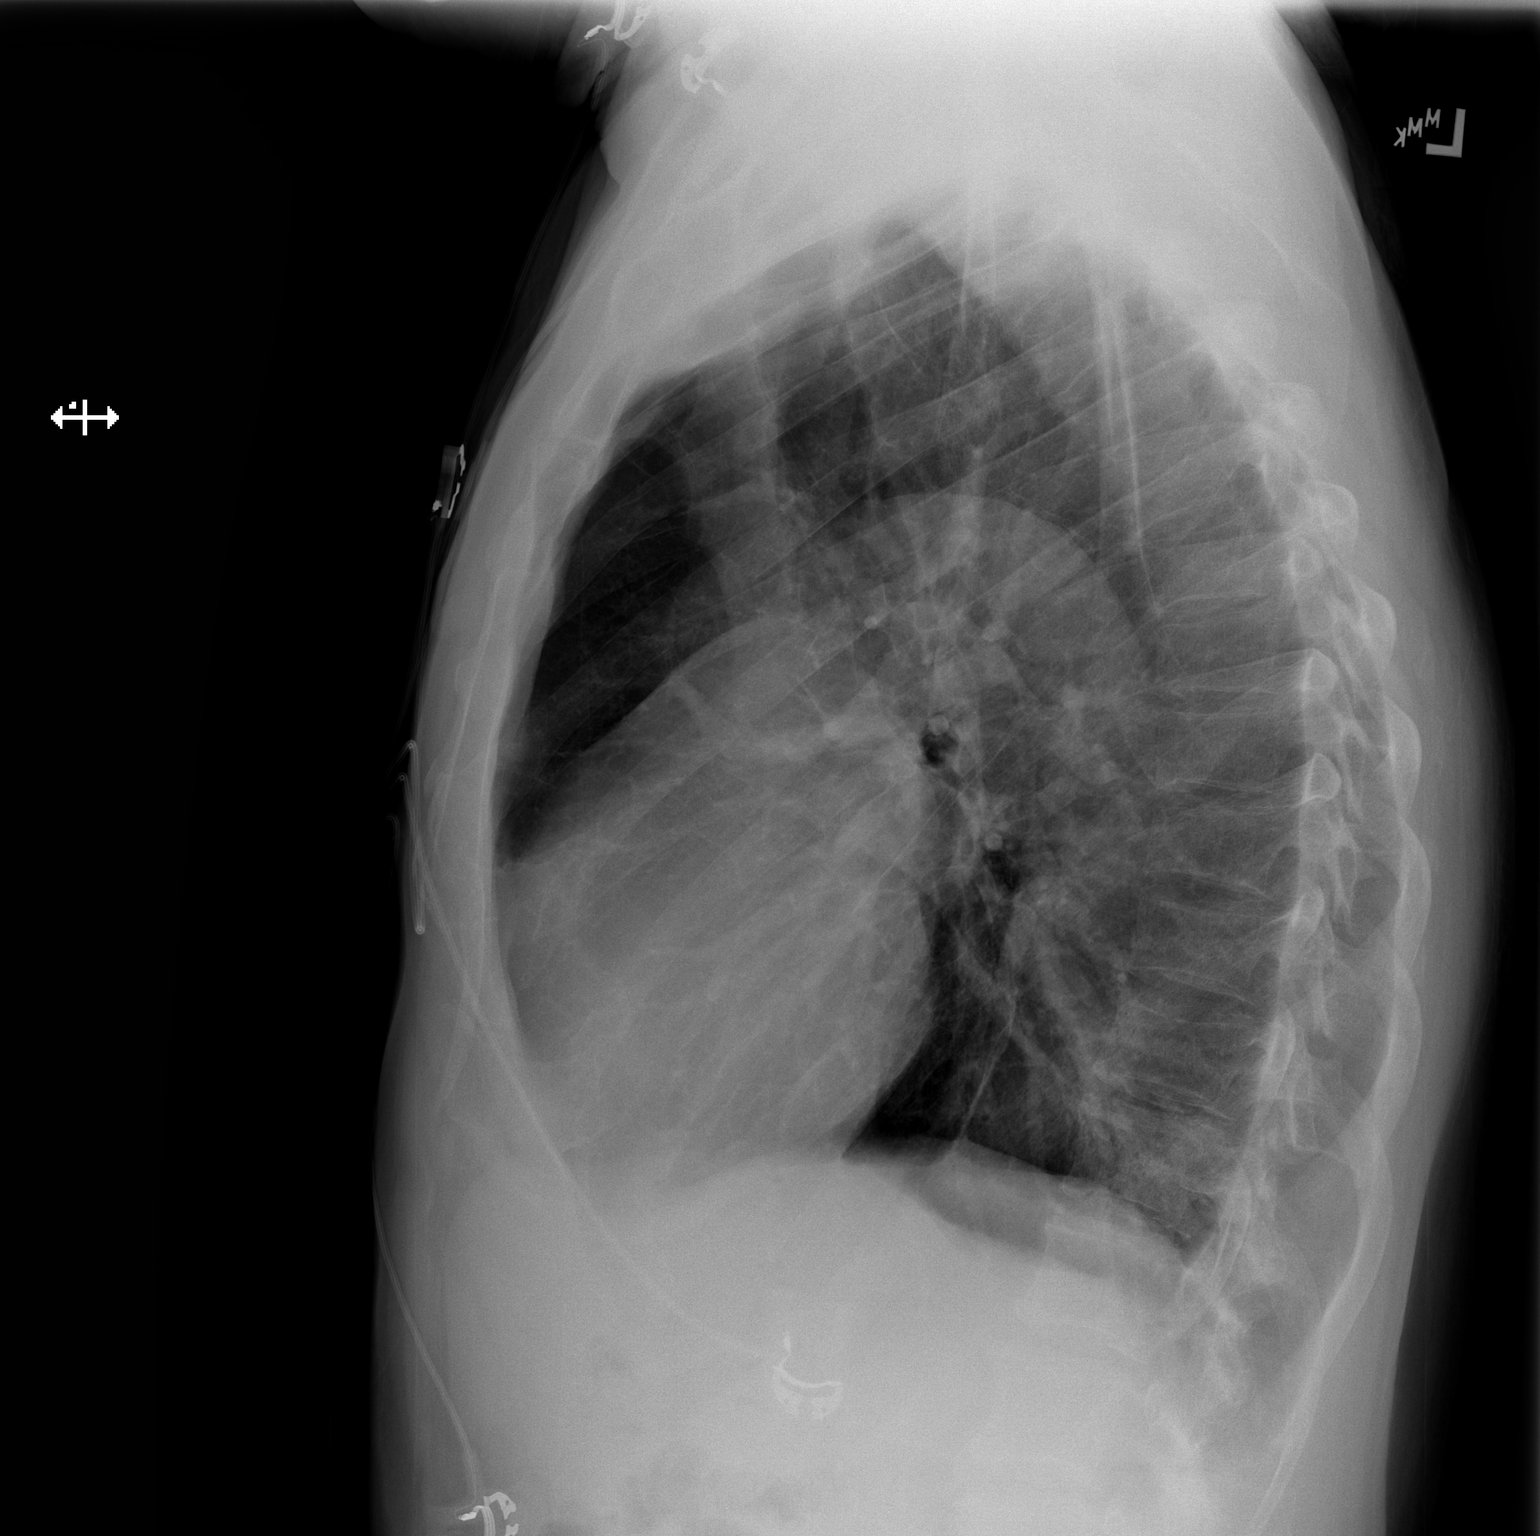

[2 of 2 positions shown; findings below may reference images not displayed]

FINDINGS: Cardiac shadow is mildly enlarged but stable. The lungs are well
aerated bilaterally. No focal infiltrate or sizable effusion is
seen. No acute bony abnormality is noted.
IMPRESSION: No active cardiopulmonary disease.

## 2021-05-31 ENCOUNTER — Ambulatory Visit: Payer: Medicare Other | Admitting: Cardiology

## 2021-07-09 ENCOUNTER — Ambulatory Visit: Payer: Medicare Other | Admitting: Cardiology

## 2021-08-30 ENCOUNTER — Ambulatory Visit: Payer: Medicare Other | Admitting: Cardiology

## 2021-08-30 NOTE — Progress Notes (Deleted)
?Cardiology Office Note:   ? ?Date:  08/30/2021  ? ?ID:  Aaron Fuller, DOB 03-Jul-1955, MRN [id number] ? ?PCP:  Wynn Banker, PA  ?Cardiologist:  Fransico Him, MD   ? ?Referring MD: Wynn Banker*  ? ?No chief complaint on file. ? ? ?History of Present Illness:   ? ?Aaron Fuller is a 66 y.o. male with a hx of HTN, alcohol dependence, tobacco abuse, paroxysmal atrial flutter on chronic a/c with Eliquis for CHADS2VASC score of 2, chronic diastolic CHF, cardiac cath 09/29/19 with normal coronary arteries and elevated filling pressures.  RHC showed pulmonary HTN and PFTs c/w severe COPD now followed by pulmonary.    ? ?He is here today for followup and is doing well.  He also needs preoperative cardiac clearance today.He denies any chest pain or pressure, SOB, DOE, PND, orthopnea, LE edema, dizziness, palpitations or syncope. He is compliant with his meds and is tolerating meds with no SE.    ? ?Past Medical History:  ?Diagnosis Date  ? Alcohol dependence ([place])   ? Chronic diastolic CHF (congestive heart failure) ([place])   ? normal coronary arteries on cath  ? COPD (chronic obstructive pulmonary disease) ([place])   ? Hypertension   ? Paroxysmal atrial flutter ([place])   ? Pulmonary HTN ([place])   ? secondary to COPD  ? Tobacco dependence   ? ? ?Past Surgical History:  ?Procedure Laterality Date  ? RIGHT/LEFT HEART CATH AND CORONARY ANGIOGRAPHY Bilateral 09/29/2019  ? Procedure: RIGHT/LEFT HEART CATH AND CORONARY ANGIOGRAPHY;  Surgeon: Troy Sine, MD;  Location: [facility];  Service: Cardiovascular;  Laterality: Bilateral;  ? ulcer surgery    ? ? ?Current Medications: ?No outpatient medications have been marked as taking for the 08/30/21 encounter (Appointment) with Sueanne Margarita, MD.  ?  ? ?Allergies:   Latex  ? ?Social History  ? ?Socioeconomic History  ? Marital status: Single  ?  Spouse name: Not on file  ? Number of children: Not on file  ? Years of education: Not on file  ? Highest education level: Not  on file  ?Occupational History  ? Occupation: retired Biomedical scientist  ?Tobacco Use  ? Smoking status: Every Day  ?  Packs/day: 1.00  ?  Years: 49.00  ?  Pack years: 49.00  ?  Types: Cigarettes  ? Smokeless tobacco: Never  ? Tobacco comments:  ?  currently 0.5  ?Vaping Use  ? Vaping Use: Never used  ?Substance and Sexual Activity  ? Alcohol use: Yes  ?  Comment: drinks 1 pint a day, last drink 2-3 days ago  ? Drug use: Never  ? Sexual activity: Not on file  ?Other Topics Concern  ? Not on file  ?Social History Narrative  ? Not on file  ? ?Social Determinants of Health  ? ?Financial Resource Strain: Not on file  ?Food Insecurity: Not on file  ?Transportation Needs: Not on file  ?Physical Activity: Not on file  ?Stress: Not on file  ?Social Connections: Not on file  ?  ? ?Family History: ?The patient's family history includes CAD in his father and mother; Diabetes in his mother and sister; Heart failure in his mother. ? ?ROS:   ?Please see the history of present illness.    ?ROS  ?All other systems reviewed and negative.  ? ?EKGs/Labs/Other Studies Reviewed:   ? ?None ? ?EKG:  EKG is ordered today and demonstrates *** ? ?Recent Labs: ?No results found for requested labs within  last 8760 hours.  ? ?Recent Lipid Panel ?   ?Component Value Date/Time  ? CHOL 186 09/28/2019 1104  ? TRIG 71 09/28/2019 1104  ? HDL 101 09/28/2019 1104  ? CHOLHDL 1.8 09/28/2019 1104  ? VLDL 14 09/28/2019 1104  ? Frannie 71 09/28/2019 1104  ? ? ?Physical Exam:   ? ?VS:  There were no vitals taken for this visit.   ? ?Wt Readings from Last 3 Encounters:  ?08/23/20 171 lb 4 Aaron (77.7 kg)  ?07/06/20 170 lb (77.1 kg)  ?11/08/19 176 lb 12.8 Aaron (80.2 kg)  ?  ? ?GEN: Well nourished, well developed in no acute distress ?HEENT: Normal ?NECK: No JVD; No carotid bruits ?LYMPHATICS: No lymphadenopathy ?CARDIAC:RRR, no murmurs, rubs, gallops ?RESPIRATORY:  Clear to auscultation without rales, wheezing or rhonchi  ?ABDOMEN: Soft, non-tender,  non-distended ?MUSCULOSKELETAL:  No edema; No deformity  ?SKIN: Warm and dry ?NEUROLOGIC:  Alert and oriented x 3 ?PSYCHIATRIC:  Normal affect   ?ASSESSMENT:   ? ?1. Chronic diastolic CHF (congestive heart failure) ([place])   ?2. Pulmonary hypertension, unspecified ([place])   ?3. Paroxysmal atrial flutter ([place])   ?4. Primary hypertension   ?5. Pure hypercholesterolemia   ?6. Tobacco dependence   ? ?PLAN:   ? ?In order of problems listed above: ? ?1.  Chronic diastolic heart failure ?-Echo with EF 55-60%, no WMA, G1DD, moderately reduced RV function, mild LVH, mod AI ?-Etiology of right heart failure and pulmonary HTN likely related to severe COPD noted on PFTs ?-he has had a lot of problems with fluid related to dietary indiscretion with Na intake and continues to have intermittent LE edema ?-I encouraged him to follow a < 2gm Na diet and cut out all added salt in his deit ?-He does not appear significantly volume overloaded on exam today. ?-Continue prescription drug management with Lasix 20 mg every other day alternating with 40 mg every other day with as needed refills ? ?2.  Moderate pulmonary HTN  ?- related to severe COPD and diastolic CHF ?- PVR borderline at 2.9WU ?- PFTs  with severe COPD ?- continue inhalers, O2 and diuretics per pulmonary ?- Continue to follow with pulmonary ?- Continue diuretics ?  ?3.  Paroxysmal atrial flutter ?- TSH normal ?- He is maintaining normal sinus rhythm on exam today.  He denies any palpitations since I saw him last ?- CHADS2VASC score is 2 ?- He denies any bleeding problems on Eliquis ?-Continue prescription drug management with Toprol-XL 75 mg daily and Eliquis 5 mg twice daily with as needed refills ?- check BMET and CBC today ?  ?4.  HTN ?-He is adequately controlled on exam ?-Continue prescription drug management with Toprol-XL 75 mg daily with as needed refills ?  ?5.  HLD ?-LDL goal < 70 ?-Continue prescription drug management with atorvastatin 20 mg daily with as needed  refills ?  ?6.  Tobacco dependence ?- he has gotten down to 2 cig daily ?- PFTs c/w severe COPD ?- O2 sats on exam today are 90% ?- on home O2 at night followed by Pulmonary ?- encouraged him to but out tobacco ? ?Followup with me in 1 year ?  ? ?Medication Adjustments/Labs and Tests Ordered: ?Current medicines are reviewed at length with the patient today.  Concerns regarding medicines are outlined above.  ?No orders of the defined types were placed in this encounter. ? ?No orders of the defined types were placed in this encounter. ? ? ?Signed, ?Fransico Him, MD  ?08/30/2021  8:01 AM    ?[facility] ?

## 2021-11-12 ENCOUNTER — Inpatient Hospital Stay (HOSPITAL_BASED_OUTPATIENT_CLINIC_OR_DEPARTMENT_OTHER)
Admission: EM | Admit: 2021-11-12 | Discharge: 2021-11-18 | DRG: 286 | Disposition: A | Discharge status: Home Health | Payer: Medicare Other | Attending: Family Medicine | Admitting: Family Medicine

## 2021-11-12 ENCOUNTER — Encounter (HOSPITAL_BASED_OUTPATIENT_CLINIC_OR_DEPARTMENT_OTHER): Payer: Self-pay

## 2021-11-12 ENCOUNTER — Emergency Department (HOSPITAL_BASED_OUTPATIENT_CLINIC_OR_DEPARTMENT_OTHER): Payer: Medicare Other

## 2021-11-12 ENCOUNTER — Other Ambulatory Visit: Payer: Self-pay

## 2021-11-12 DIAGNOSIS — N1832 Chronic kidney disease, stage 3b: Secondary | ICD-10-CM | POA: Diagnosis not present

## 2021-11-12 DIAGNOSIS — Z9981 Dependence on supplemental oxygen: Secondary | ICD-10-CM | POA: Diagnosis not present

## 2021-11-12 DIAGNOSIS — T447X6A Underdosing of beta-adrenoreceptor antagonists, initial encounter: Secondary | ICD-10-CM | POA: Diagnosis present

## 2021-11-12 DIAGNOSIS — N1831 Chronic kidney disease, stage 3a: Secondary | ICD-10-CM | POA: Diagnosis not present

## 2021-11-12 DIAGNOSIS — T443X6A Underdosing of other parasympatholytics [anticholinergics and antimuscarinics] and spasmolytics, initial encounter: Secondary | ICD-10-CM | POA: Diagnosis not present

## 2021-11-12 DIAGNOSIS — J9611 Chronic respiratory failure with hypoxia: Secondary | ICD-10-CM | POA: Diagnosis not present

## 2021-11-12 DIAGNOSIS — Z91128 Patient's intentional underdosing of medication regimen for other reason: Secondary | ICD-10-CM | POA: Diagnosis not present

## 2021-11-12 DIAGNOSIS — I5082 Biventricular heart failure: Secondary | ICD-10-CM | POA: Diagnosis not present

## 2021-11-12 DIAGNOSIS — J441 Chronic obstructive pulmonary disease with (acute) exacerbation: Principal | ICD-10-CM | POA: Diagnosis present

## 2021-11-12 DIAGNOSIS — T501X6A Underdosing of loop [high-ceiling] diuretics, initial encounter: Secondary | ICD-10-CM | POA: Diagnosis present

## 2021-11-12 DIAGNOSIS — B1920 Unspecified viral hepatitis C without hepatic coma: Secondary | ICD-10-CM | POA: Diagnosis present

## 2021-11-12 DIAGNOSIS — R609 Edema, unspecified: Secondary | ICD-10-CM

## 2021-11-12 DIAGNOSIS — T501X5A Adverse effect of loop [high-ceiling] diuretics, initial encounter: Secondary | ICD-10-CM | POA: Diagnosis present

## 2021-11-12 DIAGNOSIS — I50814 Right heart failure due to left heart failure: Secondary | ICD-10-CM | POA: Diagnosis present

## 2021-11-12 DIAGNOSIS — I5033 Acute on chronic diastolic (congestive) heart failure: Secondary | ICD-10-CM | POA: Diagnosis present

## 2021-11-12 DIAGNOSIS — D6959 Other secondary thrombocytopenia: Secondary | ICD-10-CM | POA: Diagnosis present

## 2021-11-12 DIAGNOSIS — T45516A Underdosing of anticoagulants, initial encounter: Secondary | ICD-10-CM | POA: Diagnosis present

## 2021-11-12 DIAGNOSIS — J439 Emphysema, unspecified: Secondary | ICD-10-CM | POA: Diagnosis not present

## 2021-11-12 DIAGNOSIS — J9612 Chronic respiratory failure with hypercapnia: Secondary | ICD-10-CM | POA: Diagnosis not present

## 2021-11-12 DIAGNOSIS — Z20822 Contact with and (suspected) exposure to covid-19: Secondary | ICD-10-CM | POA: Diagnosis not present

## 2021-11-12 DIAGNOSIS — F1020 Alcohol dependence, uncomplicated: Secondary | ICD-10-CM | POA: Diagnosis not present

## 2021-11-12 DIAGNOSIS — N179 Acute kidney failure, unspecified: Secondary | ICD-10-CM | POA: Diagnosis not present

## 2021-11-12 DIAGNOSIS — I5043 Acute on chronic combined systolic (congestive) and diastolic (congestive) heart failure: Secondary | ICD-10-CM | POA: Diagnosis present

## 2021-11-12 DIAGNOSIS — I952 Hypotension due to drugs: Secondary | ICD-10-CM | POA: Diagnosis present

## 2021-11-12 DIAGNOSIS — D696 Thrombocytopenia, unspecified: Secondary | ICD-10-CM | POA: Diagnosis present

## 2021-11-12 DIAGNOSIS — I272 Pulmonary hypertension, unspecified: Secondary | ICD-10-CM

## 2021-11-12 DIAGNOSIS — I130 Hypertensive heart and chronic kidney disease with heart failure and stage 1 through stage 4 chronic kidney disease, or unspecified chronic kidney disease: Principal | ICD-10-CM | POA: Diagnosis present

## 2021-11-12 DIAGNOSIS — F1090 Alcohol use, unspecified, uncomplicated: Secondary | ICD-10-CM | POA: Diagnosis present

## 2021-11-12 DIAGNOSIS — I4892 Unspecified atrial flutter: Secondary | ICD-10-CM | POA: Diagnosis present

## 2021-11-12 DIAGNOSIS — N183 Chronic kidney disease, stage 3 unspecified: Secondary | ICD-10-CM | POA: Diagnosis present

## 2021-11-12 DIAGNOSIS — F17210 Nicotine dependence, cigarettes, uncomplicated: Secondary | ICD-10-CM | POA: Diagnosis present

## 2021-11-12 DIAGNOSIS — R768 Other specified abnormal immunological findings in serum: Secondary | ICD-10-CM

## 2021-11-12 DIAGNOSIS — F1010 Alcohol abuse, uncomplicated: Secondary | ICD-10-CM | POA: Diagnosis present

## 2021-11-12 DIAGNOSIS — F17200 Nicotine dependence, unspecified, uncomplicated: Secondary | ICD-10-CM | POA: Diagnosis present

## 2021-11-12 DIAGNOSIS — R7689 Other specified abnormal immunological findings in serum: Secondary | ICD-10-CM

## 2021-11-12 LAB — CBC WITH DIFFERENTIAL/PLATELET
Abs Immature Granulocytes: 0.01 10*3/uL (ref 0.00–0.07)
Basophils Absolute: 0 10*3/uL (ref 0.0–0.1)
Basophils Relative: 1 %
Eosinophils Absolute: 0.1 10*3/uL (ref 0.0–0.5)
Eosinophils Relative: 2 %
HCT: 47.1 % (ref 39.0–52.0)
Hemoglobin: 15.6 g/dL (ref 13.0–17.0)
Immature Granulocytes: 0 %
Lymphocytes Relative: 39 %
Lymphs Abs: 1.4 10*3/uL (ref 0.7–4.0)
MCH: 31.3 pg (ref 26.0–34.0)
MCHC: 33.1 g/dL (ref 30.0–36.0)
MCV: 94.6 fL (ref 80.0–100.0)
Monocytes Absolute: 0.3 10*3/uL (ref 0.1–1.0)
Monocytes Relative: 9 %
Neutro Abs: 1.8 10*3/uL (ref 1.7–7.7)
Neutrophils Relative %: 49 %
Platelets: 110 10*3/uL — ABNORMAL LOW (ref 150–400)
RBC: 4.98 MIL/uL (ref 4.22–5.81)
RDW: 17.8 % — ABNORMAL HIGH (ref 11.5–15.5)
WBC: 3.6 10*3/uL — ABNORMAL LOW (ref 4.0–10.5)
nRBC: 0 % (ref 0.0–0.2)

## 2021-11-12 LAB — BASIC METABOLIC PANEL
Anion gap: 5 (ref 5–15)
BUN: 25 mg/dL — ABNORMAL HIGH (ref 8–23)
CO2: 37 mmol/L — ABNORMAL HIGH (ref 22–32)
Calcium: 9 mg/dL (ref 8.9–10.3)
Chloride: 96 mmol/L — ABNORMAL LOW (ref 98–111)
Creatinine, Ser: 1.83 mg/dL — ABNORMAL HIGH (ref 0.61–1.24)
GFR, Estimated: 40 mL/min — ABNORMAL LOW (ref >60)
Glucose, Bld: 133 mg/dL — ABNORMAL HIGH (ref 70–99)
Potassium: 3.9 mmol/L (ref 3.5–5.1)
Sodium: 138 mmol/L (ref 135–145)

## 2021-11-12 LAB — RESP PANEL BY RT-PCR (FLU A&B, COVID) ARPGX2
Influenza A by PCR: NEGATIVE
Influenza B by PCR: NEGATIVE
SARS Coronavirus 2 by RT PCR: NEGATIVE

## 2021-11-12 LAB — BRAIN NATRIURETIC PEPTIDE: B Natriuretic Peptide: 678.8 pg/mL — ABNORMAL HIGH (ref 0.0–100.0)

## 2021-11-12 MED ORDER — METHYLPREDNISOLONE SODIUM SUCC 125 MG IJ SOLR
125.0000 mg | Freq: Once | INTRAMUSCULAR | Status: AC
  Administered 2021-11-12: 125 mg via INTRAVENOUS
  Filled 2021-11-12: qty 2

## 2021-11-12 MED ORDER — TIOTROPIUM BROMIDE MONOHYDRATE 2.5 MCG/ACT IN AERS: 2.0000 | INHALATION_SPRAY | Freq: Every day | RESPIRATORY_TRACT | Status: DC

## 2021-11-12 MED ORDER — ALBUTEROL SULFATE (2.5 MG/3ML) 0.083% IN NEBU
5.0000 mg | INHALATION_SOLUTION | Freq: Once | RESPIRATORY_TRACT | Status: AC
  Administered 2021-11-12: 5 mg via RESPIRATORY_TRACT
  Filled 2021-11-12: qty 6

## 2021-11-12 MED ORDER — ACETAMINOPHEN 650 MG RE SUPP: 650.0000 mg | Freq: Four times a day (QID) | RECTAL | Status: DC | PRN

## 2021-11-12 MED ORDER — NICOTINE 14 MG/24HR TD PT24
14.0000 mg | MEDICATED_PATCH | Freq: Every day | TRANSDERMAL | Status: DC
  Administered 2021-11-16 – 2021-11-18 (×3): 14 mg via TRANSDERMAL
  Filled 2021-11-12 (×5): qty 1

## 2021-11-12 MED ORDER — ACETAMINOPHEN 325 MG PO TABS: 650.0000 mg | ORAL_TABLET | Freq: Four times a day (QID) | ORAL | Status: DC | PRN

## 2021-11-12 MED ORDER — ALBUTEROL SULFATE (2.5 MG/3ML) 0.083% IN NEBU
5.0000 mg | INHALATION_SOLUTION | Freq: Once | RESPIRATORY_TRACT | Status: AC
Start: 2021-11-12 — End: 2021-11-12
  Administered 2021-11-12: 5 mg via RESPIRATORY_TRACT
  Filled 2021-11-12: qty 6

## 2021-11-12 MED ORDER — ONDANSETRON HCL 4 MG/2ML IJ SOLN: 4.0000 mg | Freq: Four times a day (QID) | INTRAMUSCULAR | Status: DC | PRN

## 2021-11-12 MED ORDER — FUROSEMIDE 10 MG/ML IJ SOLN
40.0000 mg | Freq: Two times a day (BID) | INTRAMUSCULAR | Status: DC
  Administered 2021-11-13 (×3): 40 mg via INTRAVENOUS
  Filled 2021-11-12 (×3): qty 4

## 2021-11-12 MED ORDER — ONDANSETRON HCL 4 MG PO TABS: 4.0000 mg | ORAL_TABLET | Freq: Four times a day (QID) | ORAL | Status: DC | PRN

## 2021-11-12 MED ORDER — SENNOSIDES-DOCUSATE SODIUM 8.6-50 MG PO TABS: 1.0000 | ORAL_TABLET | Freq: Every evening | ORAL | Status: DC | PRN

## 2021-11-12 MED ORDER — ATORVASTATIN CALCIUM 10 MG PO TABS
20.0000 mg | ORAL_TABLET | Freq: Every day | ORAL | Status: DC
  Administered 2021-11-13 – 2021-11-17 (×5): 20 mg via ORAL
  Filled 2021-11-12 (×3): qty 1
  Filled 2021-11-12 (×2): qty 2

## 2021-11-12 MED ORDER — ALBUTEROL SULFATE (2.5 MG/3ML) 0.083% IN NEBU: 2.5000 mg | INHALATION_SOLUTION | Freq: Four times a day (QID) | RESPIRATORY_TRACT | Status: DC | PRN

## 2021-11-12 MED ORDER — METOPROLOL SUCCINATE ER 50 MG PO TB24
75.0000 mg | ORAL_TABLET | Freq: Every day | ORAL | Status: DC
  Administered 2021-11-13 – 2021-11-15 (×3): 75 mg via ORAL
  Filled 2021-11-12 (×3): qty 1

## 2021-11-12 MED ORDER — SODIUM CHLORIDE 0.9% FLUSH
3.0000 mL | Freq: Two times a day (BID) | INTRAVENOUS | Status: DC
  Administered 2021-11-13 – 2021-11-17 (×9): 3 mL via INTRAVENOUS

## 2021-11-12 MED ORDER — APIXABAN 5 MG PO TABS
5.0000 mg | ORAL_TABLET | Freq: Two times a day (BID) | ORAL | Status: DC
  Administered 2021-11-13 – 2021-11-14 (×4): 5 mg via ORAL
  Filled 2021-11-12 (×4): qty 1

## 2021-11-12 NOTE — ED Notes (Signed)
RT called to triage. Patient SATs in the 29s. Patient wears 3L at home but did not wear it here. Placed on O2, SAT 93%. Stated he has been more SOB than usual. BBS clear and slightly diminished. RT to monitor.

## 2021-11-12 NOTE — ED Provider Notes (Signed)
[facility] Provider Note   CSN: [id number] Arrival date & time: 11/12/21  1806     History  Chief Complaint  Patient presents with   Shortness of Breath    Aaron Fuller is a 66 y.o. male.  Pt is a 66 yo male with a pmhx significant for htn, copd, chf, pulmonary htn, and paroxysmal a. Flutter.  Pt went to his pcp today for sob.  He stopped taking all his meds and using his oxygen 2 days ago. He said that he went through a period of depression, but that is improved.  He wants to be back to his regular state of health.  Pt is supposed to be on 3L oxygen.  He was sent here for further eval.  Pt did not wear his oxygen here.  He said he does not have a portable tank.  His RA O2 sat was 78%.  He was put on his usual 3 L and his O2 sat eventually went up to the low 90s.  Pt still feels very sob.  Pt denies f/c.  No pain.      Home Medications Prior to Admission medications   Medication Sig Start Date End Date Taking? Authorizing Provider  albuterol (VENTOLIN HFA) 108 (90 Base) MCG/ACT inhaler Inhale 2 puffs into the lungs every 6 (six) hours as needed for wheezing or shortness of breath. 10/03/19   Hongalgi, Lenis Dickinson, MD  apixaban (ELIQUIS) 5 MG TABS tablet Take 1 tablet (5 mg total) by mouth 2 (two) times daily. 06/14/20   Sueanne Margarita, MD  atorvastatin (LIPITOR) 20 MG tablet Take 1 tablet (20 mg total) by mouth daily at 6 PM. 11/10/19   Turner, Eber Hong, MD  diphenhydrAMINE (BENADRYL) 25 mg capsule Take 25 mg by mouth every 6 (six) hours as needed (for grass/pollen-induced allergic symptoms).     [provider]  folic acid (FOLVITE) 1 MG tablet Take 1 tablet (1 mg total) by mouth daily. 10/04/19   Hongalgi, Lenis Dickinson, MD  furosemide (LASIX) 20 MG tablet Take 1 tablet (20 mg total) by mouth daily. Patient taking differently: Take 20 mg by mouth daily. Take 1 tablet (20 mg total) every other day, alternating with 2 tablets (40 mg total) every other day  11/10/19   Sueanne Margarita, MD  metoprolol succinate (TOPROL XL) 50 MG 24 hr tablet Take 1.5 tablets (75 mg total) by mouth daily. Take with or immediately following a meal. 11/08/19   Turner, Eber Hong, MD  Multiple Vitamin (MULTIVITAMIN WITH MINERALS) TABS tablet Take 1 tablet by mouth daily. 10/04/19   Hongalgi, Lenis Dickinson, MD  nicotine (NICODERM CQ - DOSED IN MG/24 HOURS) 14 mg/24hr patch Place 1 patch (14 mg total) onto the skin daily. 10/04/19   Hongalgi, Lenis Dickinson, MD  thiamine 100 MG tablet Take 1 tablet (100 mg total) by mouth daily. 10/04/19   Hongalgi, Lenis Dickinson, MD  Tiotropium Bromide Monohydrate (SPIRIVA RESPIMAT) 2.5 MCG/ACT AERS Inhale 2 puffs into the lungs daily. 08/23/20   Freddi Starr, MD      Allergies    Latex    Review of Systems   Review of Systems  Respiratory:  Positive for shortness of breath and wheezing.   Cardiovascular:  Positive for leg swelling.  All other systems reviewed and are negative.  Physical Exam Updated Vital Signs BP (!) 131/94   Pulse (!) 105   Temp 98.2 F (36.8 C) (Oral)   Resp (!) 32  Ht 5' 8.5" (1.74 m)   Wt 74.8 kg   SpO2 95%   BMI 24.72 kg/m  Physical Exam Vitals and nursing note reviewed.  Constitutional:      Appearance: He is well-developed. He is ill-appearing.  HENT:     Head: Normocephalic and atraumatic.     Mouth/Throat:     Mouth: Mucous membranes are moist.     Pharynx: Oropharynx is clear.  Eyes:     Extraocular Movements: Extraocular movements intact.     Pupils: Pupils are equal, round, and reactive to light.  Cardiovascular:     Rate and Rhythm: Regular rhythm. Tachycardia present.  Pulmonary:     Effort: Tachypnea present.     Breath sounds: Decreased breath sounds and wheezing present.  Abdominal:     General: Bowel sounds are normal.     Palpations: Abdomen is soft.  Musculoskeletal:        General: Normal range of motion.     Cervical back: Normal range of motion and neck supple.     Right lower leg: Edema  present.     Left lower leg: Edema present.  Skin:    General: Skin is warm.     Capillary Refill: Capillary refill takes less than 2 seconds.  Neurological:     General: No focal deficit present.     Mental Status: He is alert and oriented to person, place, and time.  Psychiatric:        Mood and Affect: Mood normal.        Behavior: Behavior normal.    ED Results / Procedures / Treatments   Labs (all labs ordered are listed, but only abnormal results are displayed) Labs Reviewed  BASIC METABOLIC PANEL - Abnormal; Notable for the following components:      Result Value   Chloride 96 (*)    CO2 37 (*)    Glucose, Bld 133 (*)    BUN 25 (*)    Creatinine, Ser 1.83 (*)    GFR, Estimated 40 (*)    All other components within normal limits  CBC WITH DIFFERENTIAL/PLATELET - Abnormal; Notable for the following components:   WBC 3.6 (*)    RDW 17.8 (*)    Platelets 110 (*)    All other components within normal limits  BRAIN NATRIURETIC PEPTIDE - Abnormal; Notable for the following components:   B Natriuretic Peptide 678.8 (*)    All other components within normal limits  RESP PANEL BY RT-PCR (FLU A&B, COVID) [id number]    EKG EKG Interpretation  Date/Time:  Monday Nov 12 2021 18:37:06 EDT Ventricular Rate:  111 PR Interval:  160 QRS Duration: 91 QT Interval:  303 QTC Calculation: 412 R Axis:   104 Text Interpretation: Sinus tachycardia Anterior infarct, old Borderline T abnormalities, inferior leads Borderline ST elevation, lateral leads No significant change since last tracing Confirmed by Isla Pence [phone]) on 11/12/2021 7:01:08 PM  Radiology DG Chest Port 1 View  Result Date: 11/12/2021 CLINICAL DATA:  Shortness of breath EXAM: PORTABLE CHEST 1 VIEW COMPARISON:  10/01/2019 FINDINGS: The heart is slightly enlarged. No focal opacity, pleural effusion, or pneumothorax. Emphysema. IMPRESSION: 1. Hyperinflation with emphysema 2. Mild cardiomegaly. Electronically Signed   By:  Donavan Foil M.D.   On: 11/12/2021 19:17    Procedures Procedures    Medications Ordered in ED Medications  methylPREDNISolone sodium succinate (SOLU-MEDROL) 125 mg/2 mL injection 125 mg (125 mg Intravenous Given 11/12/21 1853)  albuterol (PROVENTIL) (2.5 MG/3ML) 0.083%  nebulizer solution 5 mg (5 mg Nebulization Given 11/12/21 1846)  albuterol (PROVENTIL) (2.5 MG/3ML) 0.083% nebulizer solution 5 mg (5 mg Nebulization Given 11/12/21 2030)    ED Course/ Medical Decision Making/ A&P                           Medical Decision Making Amount and/or Complexity of Data Reviewed Labs: ordered. Radiology: ordered.  Risk Prescription drug management. Decision regarding hospitalization.   This patient presents to the ED for concern of sob, this involves an extensive number of treatment options, and is a complaint that carries with it a high risk of complications and morbidity.  The differential diagnosis includes copd exac, chf exac, pna, uri, covid/flu   Co morbidities that complicate the patient evaluation  tn, copd, chf, pulmonary htn, and paroxysmal a. Flutter   Additional history obtained:  Additional history obtained from epic chart review External records from outside source obtained and reviewed including family   Lab Tests:  I Ordered, and personally interpreted labs.  The pertinent results include:  cbc with wbc 3.6 and plt 110; bmp with mild aki (bun 25 and cr 1.83 up from 19 and 1.43 in Jan of 22); covid/flu neg; bnp 679   Imaging Studies ordered:  I ordered imaging studies including cxr  I independently visualized and interpreted imaging which showed    IMPRESSION:  1. Hyperinflation with emphysema  2. Mild cardiomegaly.   I agree with the radiologist interpretation   Cardiac Monitoring:  The patient was maintained on a cardiac monitor.  I personally viewed and interpreted the cardiac monitored which showed an underlying rhythm of: sinus tachy   Medicines  ordered and prescription drug management:  I ordered medication including solumedrol and albuterol for copd  Reevaluation of the patient after these medicines showed that the patient improved I have reviewed the patients home medicines and have made adjustments as needed  Critical Interventions:  nebs   Consultations Obtained:  I requested consultation with the hospitalist (Dr. Nevada Crane),  and discussed lab and imaging findings as well as pertinent plan . She will admit for obs.  Problem List / ED Course:  COPD exacerbation:  pt has improved after nebs and steroids, but is still very sob with any movement in the bed.  RR and HR goes up with any movement.  Pt does not have a portable O2 tank.  I think SW needs to check to make sure pt is getting his oxygen delivered appropriately.  I do not think pt would do well at home tonight.   Reevaluation:  After the interventions noted above, I reevaluated the patient and found that they have :improved   Social Determinants of Health:  Lives alone at home   Dispostion:  After consideration of the diagnostic results and the patients response to treatment, I feel that the patent would benefit from admission.    CRITICAL CARE Performed by: Isla Pence   Total critical care time: 30 minutes  Critical care time was exclusive of separately billable procedures and treating other patients.  Critical care was necessary to treat or prevent imminent or life-threatening deterioration.  Critical care was time spent personally by me on the following activities: development of treatment plan with patient and/or surrogate as well as nursing, discussions with consultants, evaluation of patient's response to treatment, examination of patient, obtaining history from patient or surrogate, ordering and performing treatments and interventions, ordering and review of laboratory studies, ordering  and review of radiographic studies, pulse oximetry and  re-evaluation of patient's condition.         Final Clinical Impression(s) / ED Diagnoses Final diagnoses:  COPD exacerbation ([place])  AKI (acute kidney injury) ([place])  Peripheral edema    Rx / DC Orders ED Discharge Orders     None         Isla Pence, MD 11/12/21 2031

## 2021-11-12 NOTE — ED Triage Notes (Signed)
Pt c/o SOB x 3 weeks. Was seen at PCP today and was told to come here for eval. SpO2 78% on RA at arrival, normally wears 2L home O2, not on oxygen at arrival. RT in triage, placed on 5L [place] which improved to 94%.   On 3L with SpO2 at 92%.

## 2021-11-12 NOTE — ED Notes (Signed)
Called [facility] to advise that patient has a bed ready @ [facility], 4W room 1441.  Was advised that they will be here as soon as they have transport

## 2021-11-12 NOTE — H&P (Signed)
History and Physical    Aaron Fuller [id number] DOB: May 10, 1956 DOA: 11/12/2021  PCP: Wynn Banker, PA   Patient coming from: Home   Chief Complaint: SOB   HPI: Aaron Fuller is a pleasant 66 y.o. male with medical history significant for COPD, chronic hypoxic and hypercarbic respiratory failure, chronic diastolic CHF, paroxysmal atrial flutter on Eliquis, tobacco dependence, alcohol abuse in remission, chronic kidney disease stage III, and hepatitis C, now presenting to the emergency department at the direction of his PCP for evaluation of shortness of breath.    Unfortunately, the patient had multiple siblings pass away in short succession a few months ago which led him to resume excessive alcohol use and stop all of his medications.  He was able to quit drinking again roughly 2 months ago and resumed his metoprolol and Eliquis, but reports not taking Lasix or his other medications in months now.    He uses 3 L/min of supplemental oxygen at home at baseline but has become progressively dyspneic over the past couple months.    He reports worsening in bilateral lower extremity edema and has also had some orthopnea.    He denies cough, fever, chills, or chest pain.    He wants to quit smoking, has been cutting back, and is now down to 1/2 pack/day.  He has 1 sibling left, a brother who lives locally and is a source of social support, and also notes that his girlfriend is a Marine scientist and has been encouraging him to get back on his medications.  He saw a Family Medicine PA today, did not bring supplemental oxygen with him, was saturating in the 70s on room air, improved to low 90s on his usual 3 L/min, and was directed to the ED for further evaluation.  [facility] ED Course: Upon arrival to the ED, patient is found to be afebrile, saturating 89 to 95% on 3 L/min of supplemental oxygen, slightly tachycardic, and tachypneic.  Work-up was notable for BNP elevated 679, serum bicarbonate of 37,  creatinine 1.83, and platelets 110,000.  COVID and influenza PCR were negative.  The patient was given 125 mg of IV Solu-Medrol, albuterol, and transferred to [facility] for admission.  Review of Systems:  All other systems reviewed and apart from HPI, are negative.  Past Medical History:  Diagnosis Date   Alcohol dependence ([place])    Chronic diastolic CHF (congestive heart failure) ([facility])    normal coronary arteries on cath   COPD (chronic obstructive pulmonary disease) ([facility])    Hypertension    Paroxysmal atrial flutter ([place])    Pulmonary HTN ([place])    secondary to COPD   Tobacco dependence     Past Surgical History:  Procedure Laterality Date   RIGHT/LEFT HEART CATH AND CORONARY ANGIOGRAPHY Bilateral 09/29/2019   Procedure: RIGHT/LEFT HEART CATH AND CORONARY ANGIOGRAPHY;  Surgeon: Troy Sine, MD;  Location: [facility];  Service: Cardiovascular;  Laterality: Bilateral;   ulcer surgery      Social History:   reports that he has been smoking cigarettes. He has a 49.00 pack-year smoking history. He has never used smokeless tobacco. He reports that he does not currently use alcohol. He reports that he does not currently use drugs.  Allergies  Allergen Reactions   Latex Rash    Family History  Problem Relation Age of Onset   CAD Mother    Diabetes Mother    Heart failure Mother    CAD Father  Diabetes Sister      Prior to Admission medications   Medication Sig Start Date End Date Taking? Authorizing Provider  albuterol (VENTOLIN HFA) 108 (90 Base) MCG/ACT inhaler Inhale 2 puffs into the lungs every 6 (six) hours as needed for wheezing or shortness of breath. 10/03/19   Hongalgi, Lenis Dickinson, MD  apixaban (ELIQUIS) 5 MG TABS tablet Take 1 tablet (5 mg total) by mouth 2 (two) times daily. 06/14/20   Sueanne Margarita, MD  atorvastatin (LIPITOR) 20 MG tablet Take 1 tablet (20 mg total) by mouth daily at 6 PM. 11/10/19   Turner, Eber Hong, MD  diphenhydrAMINE  (BENADRYL) 25 mg capsule Take 25 mg by mouth every 6 (six) hours as needed (for grass/pollen-induced allergic symptoms).     [provider]  folic acid (FOLVITE) 1 MG tablet Take 1 tablet (1 mg total) by mouth daily. 10/04/19   Hongalgi, Lenis Dickinson, MD  furosemide (LASIX) 20 MG tablet Take 1 tablet (20 mg total) by mouth daily. Patient taking differently: Take 20 mg by mouth daily. Take 1 tablet (20 mg total) every other day, alternating with 2 tablets (40 mg total) every other day 11/10/19   Sueanne Margarita, MD  metoprolol succinate (TOPROL XL) 50 MG 24 hr tablet Take 1.5 tablets (75 mg total) by mouth daily. Take with or immediately following a meal. 11/08/19   Turner, Eber Hong, MD  Multiple Vitamin (MULTIVITAMIN WITH MINERALS) TABS tablet Take 1 tablet by mouth daily. 10/04/19   Hongalgi, Lenis Dickinson, MD  nicotine (NICODERM CQ - DOSED IN MG/24 HOURS) 14 mg/24hr patch Place 1 patch (14 mg total) onto the skin daily. 10/04/19   Hongalgi, Lenis Dickinson, MD  thiamine 100 MG tablet Take 1 tablet (100 mg total) by mouth daily. 10/04/19   Hongalgi, Lenis Dickinson, MD  Tiotropium Bromide Monohydrate (SPIRIVA RESPIMAT) 2.5 MCG/ACT AERS Inhale 2 puffs into the lungs daily. 08/23/20   Freddi Starr, MD    Physical Exam: Vitals:   11/12/21 1930 11/12/21 2046 11/12/21 2100 11/12/21 2243  BP: (!) 131/94 (!) 148/100 (!) 144/102 (!) 147/96  Pulse: (!) 105 (!) 102 (!) 102 (!) 105  Resp: (!) 32 (!) 23 (!) 32 (!) 28  Temp:    98.2 F (36.8 C)  TempSrc:    Oral  SpO2: 95% 93% 95% 91%  Weight:      Height:        Constitutional: NAD, calm  Eyes: PERTLA, lids and conjunctivae normal ENMT: Mucous membranes are moist. Posterior pharynx clear of any exudate or lesions.   Neck: supple, no masses  Respiratory: Diminished bilaterally, no wheezing. No accessory muscle use.  Cardiovascular: S1 & S2 heard, regular rate and rhythm. Pitting edema involving b/l lower legs. Neck veins are distended.  Abdomen: No distension, no  tenderness, soft. Bowel sounds active.  Musculoskeletal: no clubbing / cyanosis. No joint deformity upper and lower extremities.   Skin: no significant rashes, lesions, ulcers. Warm, dry, well-perfused. Neurologic: CN 2-12 grossly intact. Moving all extremities. Alert and oriented.  Psychiatric: Pleasant. Cooperative.    Labs and Imaging on Admission: I have personally reviewed following labs and imaging studies  CBC: Recent Labs  Lab 11/12/21 1838  WBC 3.6*  NEUTROABS 1.8  HGB 15.6  HCT 47.1  MCV 94.6  PLT [id number]*   Basic Metabolic Panel: Recent Labs  Lab 11/12/21 1838  NA 138  K 3.9  CL 96*  CO2 37*  GLUCOSE 133*  BUN 25*  CREATININE 1.83*  CALCIUM 9.0   GFR: Estimated Creatinine Clearance: 38 mL/min (A) (by C-G formula based on SCr of 1.83 mg/dL (H)). Liver Function Tests: No results for input(s): AST, ALT, ALKPHOS, BILITOT, PROT, ALBUMIN in the last 168 hours. No results for input(s): LIPASE, AMYLASE in the last 168 hours. No results for input(s): AMMONIA in the last 168 hours. Coagulation Profile: No results for input(s): INR, PROTIME in the last 168 hours. Cardiac Enzymes: No results for input(s): CKTOTAL, CKMB, CKMBINDEX, TROPONINI in the last 168 hours. BNP (last 3 results) No results for input(s): PROBNP in the last 8760 hours. HbA1C: No results for input(s): HGBA1C in the last 72 hours. CBG: No results for input(s): GLUCAP in the last 168 hours. Lipid Profile: No results for input(s): CHOL, HDL, LDLCALC, TRIG, CHOLHDL, LDLDIRECT in the last 72 hours. Thyroid Function Tests: No results for input(s): TSH, T4TOTAL, FREET4, T3FREE, THYROIDAB in the last 72 hours. Anemia Panel: No results for input(s): VITAMINB12, FOLATE, FERRITIN, TIBC, IRON, RETICCTPCT in the last 72 hours. Urine analysis:    Component Value Date/Time   COLORURINE AMBER (A) 10/01/2019 1429   APPEARANCEUR CLOUDY (A) 10/01/2019 1429   LABSPEC 1.020 10/01/2019 1429   PHURINE 6.0  10/01/2019 1429   GLUCOSEU NEGATIVE 10/01/2019 1429   HGBUR NEGATIVE 10/01/2019 1429   BILIRUBINUR NEGATIVE 10/01/2019 1429   KETONESUR NEGATIVE 10/01/2019 1429   PROTEINUR 100 (A) 10/01/2019 1429   NITRITE NEGATIVE 10/01/2019 1429   LEUKOCYTESUR SMALL (A) 10/01/2019 1429   Sepsis Labs: @LABRCNTIP (procalcitonin:4,lacticidven:4) ) Recent Results (from the past 240 hour(s))  Resp Panel by RT-PCR (Flu A&B, Covid) Nasopharyngeal Swab     Status: None   Collection Time: 11/12/21  6:38 PM   Specimen: Nasopharyngeal Swab; Nasopharyngeal(NP) swabs in vial transport medium  Result Value Ref Range Status   SARS Coronavirus 2 by RT PCR NEGATIVE NEGATIVE Final    Comment: (NOTE) SARS-CoV-2 target nucleic acids are NOT DETECTED.  The SARS-CoV-2 RNA is generally detectable in upper respiratory specimens during the acute phase of infection. The lowest concentration of SARS-CoV-2 viral copies this assay can detect is 138 copies/mL. A negative result does not preclude SARS-Cov-2 infection and should not be used as the sole basis for treatment or other patient management decisions. A negative result may occur with  improper specimen collection/handling, submission of specimen other than nasopharyngeal swab, presence of viral mutation(s) within the areas targeted by this assay, and inadequate number of viral copies(<138 copies/mL). A negative result must be combined with clinical observations, patient history, and epidemiological information. The expected result is Negative.  Fact Sheet for Patients:  EntrepreneurPulse.com.au  Fact Sheet for Healthcare Providers:  IncredibleEmployment.be  This test is no t yet approved or cleared by the [place] FDA and  has been authorized for detection and/or diagnosis of SARS-CoV-2 by FDA under an Emergency Use Authorization (EUA). This EUA will remain  in effect (meaning this test can be used) for the duration of  the COVID-19 declaration under Section 564(b)(1) of the Act, 21 U.S.C.section 360bbb-3(b)(1), unless the authorization is terminated  or revoked sooner.       Influenza A by PCR NEGATIVE NEGATIVE Final   Influenza B by PCR NEGATIVE NEGATIVE Final    Comment: (NOTE) The Xpert Xpress SARS-CoV-2/FLU/RSV plus assay is intended as an aid in the diagnosis of influenza from Nasopharyngeal swab specimens and should not be used as a sole basis for treatment. Nasal washings and aspirates are unacceptable for Xpert Xpress SARS-CoV-2/FLU/RSV testing.  Fact Sheet for Patients: EntrepreneurPulse.com.au  Fact Sheet for Healthcare Providers: IncredibleEmployment.be  This test is not yet approved or cleared by the [place] FDA and has been authorized for detection and/or diagnosis of SARS-CoV-2 by FDA under an Emergency Use Authorization (EUA). This EUA will remain in effect (meaning this test can be used) for the duration of the COVID-19 declaration under Section 564(b)(1) of the Act, 21 U.S.C. section 360bbb-3(b)(1), unless the authorization is terminated or revoked.  Performed at [facility], [address] [place], [place] [address]      Radiological Exams on Admission: DG Chest [facility] 1 View  Result Date: 11/12/2021 CLINICAL DATA:  Shortness of breath EXAM: PORTABLE CHEST 1 VIEW COMPARISON:  10/01/2019 FINDINGS: The heart is slightly enlarged. No focal opacity, pleural effusion, or pneumothorax. Emphysema. IMPRESSION: 1. Hyperinflation with emphysema 2. Mild cardiomegaly. Electronically Signed   By: Donavan Foil M.D.   On: 11/12/2021 19:17    EKG: Independently reviewed. Sinus tachycardia, rate 111.   Assessment/Plan   1. Acute on chronic diastolic CHF  - Presents with months of progressive dyspnea and leg swelling after stopping his medications including Lasix   - BNP has increased to 679 and he has peripheral edema and JVD  -  Diurese with IV Lasix, follow daily wt and I/Os, and can likely transition back to oral Lasix in the next day or so   2. COPD; chronic hypoxic and hypercarbic respiratory failure  - Requires 3 Lpm supplemental O2 at baseline  - No recent cough, not wheezing - Continue Spiriva and as-needed albuterol    3. CKD III  - SCr is 1.83 on admission, up from 1.4 in January 2022  - Renally-dose medications, follow closely while diuresing    4. Paroxysmal atrial flutter  - Continue Eliquis and metoprolol    5. Thrombocytopenia  - Platelets 110k on admission without apparent bleeding or infection  - Appears chronic, attributed to alcoholism    6. Hep C  - Saw GI in clinic in Aug 2022 but never followed up from treatment  - Outpatient follow-up recommended    DVT prophylaxis: Eliquis  Code Status: Full  Level of Care: Level of care: Telemetry Family Communication: None present  Disposition Plan:  Patient is from: home  Anticipated d/c is to: home  Anticipated d/c date is: 5/23 or 11/14/21  Patient currently: Pending improvement in dyspnea   Consults called: none  Admission status: Observation     Vianne Bulls, MD [facility]  11/12/2021, 11:35 PM

## 2021-11-13 ENCOUNTER — Observation Stay (HOSPITAL_COMMUNITY): Payer: Medicare Other

## 2021-11-13 DIAGNOSIS — T443X6A Underdosing of other parasympatholytics [anticholinergics and antimuscarinics] and spasmolytics, initial encounter: Secondary | ICD-10-CM | POA: Diagnosis present

## 2021-11-13 DIAGNOSIS — F17210 Nicotine dependence, cigarettes, uncomplicated: Secondary | ICD-10-CM | POA: Diagnosis present

## 2021-11-13 DIAGNOSIS — I5033 Acute on chronic diastolic (congestive) heart failure: Secondary | ICD-10-CM | POA: Diagnosis present

## 2021-11-13 DIAGNOSIS — T447X6A Underdosing of beta-adrenoreceptor antagonists, initial encounter: Secondary | ICD-10-CM | POA: Diagnosis present

## 2021-11-13 DIAGNOSIS — Z91128 Patient's intentional underdosing of medication regimen for other reason: Secondary | ICD-10-CM | POA: Diagnosis not present

## 2021-11-13 DIAGNOSIS — T501X5A Adverse effect of loop [high-ceiling] diuretics, initial encounter: Secondary | ICD-10-CM | POA: Diagnosis present

## 2021-11-13 DIAGNOSIS — N1831 Chronic kidney disease, stage 3a: Secondary | ICD-10-CM | POA: Diagnosis present

## 2021-11-13 DIAGNOSIS — Z9981 Dependence on supplemental oxygen: Secondary | ICD-10-CM | POA: Diagnosis not present

## 2021-11-13 DIAGNOSIS — I5043 Acute on chronic combined systolic (congestive) and diastolic (congestive) heart failure: Secondary | ICD-10-CM | POA: Diagnosis not present

## 2021-11-13 DIAGNOSIS — J441 Chronic obstructive pulmonary disease with (acute) exacerbation: Principal | ICD-10-CM | POA: Diagnosis present

## 2021-11-13 DIAGNOSIS — N179 Acute kidney failure, unspecified: Secondary | ICD-10-CM | POA: Diagnosis present

## 2021-11-13 DIAGNOSIS — D6959 Other secondary thrombocytopenia: Secondary | ICD-10-CM | POA: Diagnosis present

## 2021-11-13 DIAGNOSIS — I428 Other cardiomyopathies: Secondary | ICD-10-CM | POA: Diagnosis not present

## 2021-11-13 DIAGNOSIS — R609 Edema, unspecified: Secondary | ICD-10-CM | POA: Diagnosis present

## 2021-11-13 DIAGNOSIS — F1020 Alcohol dependence, uncomplicated: Secondary | ICD-10-CM | POA: Diagnosis present

## 2021-11-13 DIAGNOSIS — I130 Hypertensive heart and chronic kidney disease with heart failure and stage 1 through stage 4 chronic kidney disease, or unspecified chronic kidney disease: Secondary | ICD-10-CM | POA: Diagnosis present

## 2021-11-13 DIAGNOSIS — I5082 Biventricular heart failure: Secondary | ICD-10-CM | POA: Diagnosis present

## 2021-11-13 DIAGNOSIS — J439 Emphysema, unspecified: Secondary | ICD-10-CM | POA: Diagnosis present

## 2021-11-13 DIAGNOSIS — J9611 Chronic respiratory failure with hypoxia: Secondary | ICD-10-CM | POA: Diagnosis present

## 2021-11-13 DIAGNOSIS — J9612 Chronic respiratory failure with hypercapnia: Secondary | ICD-10-CM | POA: Diagnosis present

## 2021-11-13 DIAGNOSIS — T501X6A Underdosing of loop [high-ceiling] diuretics, initial encounter: Secondary | ICD-10-CM | POA: Diagnosis present

## 2021-11-13 DIAGNOSIS — B1920 Unspecified viral hepatitis C without hepatic coma: Secondary | ICD-10-CM | POA: Diagnosis present

## 2021-11-13 DIAGNOSIS — T45516A Underdosing of anticoagulants, initial encounter: Secondary | ICD-10-CM | POA: Diagnosis present

## 2021-11-13 DIAGNOSIS — I4892 Unspecified atrial flutter: Secondary | ICD-10-CM | POA: Diagnosis present

## 2021-11-13 DIAGNOSIS — Z20822 Contact with and (suspected) exposure to covid-19: Secondary | ICD-10-CM | POA: Diagnosis present

## 2021-11-13 DIAGNOSIS — I272 Pulmonary hypertension, unspecified: Secondary | ICD-10-CM | POA: Diagnosis present

## 2021-11-13 DIAGNOSIS — I952 Hypotension due to drugs: Secondary | ICD-10-CM | POA: Diagnosis present

## 2021-11-13 LAB — DIFFERENTIAL
Abs Immature Granulocytes: 0.05 10*3/uL (ref 0.00–0.07)
Basophils Absolute: 0 10*3/uL (ref 0.0–0.1)
Basophils Relative: 1 %
Eosinophils Absolute: 0 10*3/uL (ref 0.0–0.5)
Eosinophils Relative: 0 %
Immature Granulocytes: 3 %
Lymphocytes Relative: 10 %
Lymphs Abs: 0.2 10*3/uL — ABNORMAL LOW (ref 0.7–4.0)
Monocytes Absolute: 0.1 10*3/uL (ref 0.1–1.0)
Monocytes Relative: 3 %
Neutro Abs: 1.7 10*3/uL (ref 1.7–7.7)
Neutrophils Relative %: 83 %

## 2021-11-13 LAB — CBC
HCT: 44.5 % (ref 39.0–52.0)
Hemoglobin: 14.6 g/dL (ref 13.0–17.0)
MCH: 31.1 pg (ref 26.0–34.0)
MCHC: 32.8 g/dL (ref 30.0–36.0)
MCV: 94.7 fL (ref 80.0–100.0)
Platelets: 101 10*3/uL — ABNORMAL LOW (ref 150–400)
RBC: 4.7 MIL/uL (ref 4.22–5.81)
RDW: 17.1 % — ABNORMAL HIGH (ref 11.5–15.5)
WBC: 1.8 10*3/uL — ABNORMAL LOW (ref 4.0–10.5)
nRBC: 0 % (ref 0.0–0.2)

## 2021-11-13 LAB — HIV ANTIBODY (ROUTINE TESTING W REFLEX): HIV Screen 4th Generation wRfx: NONREACTIVE

## 2021-11-13 LAB — BASIC METABOLIC PANEL
Anion gap: 9 (ref 5–15)
BUN: 26 mg/dL — ABNORMAL HIGH (ref 8–23)
CO2: 31 mmol/L (ref 22–32)
Calcium: 8.6 mg/dL — ABNORMAL LOW (ref 8.9–10.3)
Chloride: 99 mmol/L (ref 98–111)
Creatinine, Ser: 1.56 mg/dL — ABNORMAL HIGH (ref 0.61–1.24)
GFR, Estimated: 48 mL/min — ABNORMAL LOW (ref >60)
Glucose, Bld: 131 mg/dL — ABNORMAL HIGH (ref 70–99)
Potassium: 4.4 mmol/L (ref 3.5–5.1)
Sodium: 139 mmol/L (ref 135–145)

## 2021-11-13 LAB — ECHOCARDIOGRAM COMPLETE
AR max vel: 2.51 cm2
AV Peak grad: 8.1 mmHg
Ao pk vel: 1.42 m/s
Area-P 1/2: 5.16 cm2
Height: 68.5 in
P 1/2 time: 667 msec
S' Lateral: 2.6 cm
Single Plane A4C EF: 29.2 %
Weight: 2599.66 oz

## 2021-11-13 LAB — MAGNESIUM: Magnesium: 1.7 mg/dL (ref 1.7–2.4)

## 2021-11-13 MED ORDER — TRAZODONE HCL 50 MG PO TABS
50.0000 mg | ORAL_TABLET | Freq: Every evening | ORAL | Status: DC | PRN
Start: 2021-11-13 — End: 2021-11-15

## 2021-11-13 MED ORDER — METOPROLOL TARTRATE 5 MG/5ML IV SOLN: 5.0000 mg | INTRAVENOUS | Status: DC | PRN

## 2021-11-13 MED ORDER — GUAIFENESIN 100 MG/5ML PO LIQD: 5.0000 mL | ORAL | Status: DC | PRN

## 2021-11-13 MED ORDER — UMECLIDINIUM BROMIDE 62.5 MCG/ACT IN AEPB
1.0000 | INHALATION_SPRAY | Freq: Every day | RESPIRATORY_TRACT | Status: DC
  Administered 2021-11-13 – 2021-11-14 (×2): 1 via RESPIRATORY_TRACT
  Filled 2021-11-13: qty 7

## 2021-11-13 MED ORDER — HYDRALAZINE HCL 20 MG/ML IJ SOLN: 10.0000 mg | INTRAMUSCULAR | Status: DC | PRN

## 2021-11-13 MED ORDER — ARFORMOTEROL TARTRATE 15 MCG/2ML IN NEBU
15.0000 ug | INHALATION_SOLUTION | Freq: Two times a day (BID) | RESPIRATORY_TRACT | Status: DC
Start: 2021-11-13 — End: 2021-11-14
  Administered 2021-11-13 – 2021-11-14 (×3): 15 ug via RESPIRATORY_TRACT
  Filled 2021-11-13 (×3): qty 2

## 2021-11-13 MED ORDER — IPRATROPIUM-ALBUTEROL 0.5-2.5 (3) MG/3ML IN SOLN: 3.0000 mL | RESPIRATORY_TRACT | Status: DC | PRN

## 2021-11-13 NOTE — Evaluation (Signed)
Occupational Therapy Evaluation Patient Details Name: Aaron Fuller MRN: [id number] DOB: 31-Mar-1956 Today's Date: 11/13/2021   History of Present Illness Pt is a 66 y.o. male presenting with acute on chronic diastolic CHF. PMH significant for COPD, chronic hypoxic and hypercarbic respiratory failure, chronic diastolic CHF, paroxysmal atrial flutter on Eliquis, tobacco dependence, alcohol abuse in remission, chronic kidney disease stage III, and hepatitis C.   Clinical Impression   Patient evaluated by Occupational Therapy with no further acute OT needs identified. All education has been completed and the patient has no further questions. Patient is MI for ADL tasks with patient endorsing being at baseline at this time. Patient would benefit from shower seat for energy conservation in next level of care.  See below for any follow-up Occupational Therapy or equipment needs. OT is signing off. Thank you for this referral.       Recommendations for follow up therapy are one component of a multi-disciplinary discharge planning process, led by the attending physician.  Recommendations may be updated based on patient status, additional functional criteria and insurance authorization.   Follow Up Recommendations  No OT follow up    Assistance Recommended at Discharge PRN  Patient can return home with the following Assistance with cooking/housework;Direct supervision/assist for medications management    Functional Status Assessment  Patient has not had a recent decline in their functional status  Equipment Recommendations  Tub/shower bench    Recommendations for Other Services       Precautions / Restrictions Precautions Precaution Comments: Reports blurry vision - plan for cataract surgery June 20th per pt, 3L/min O2 at baseline Restrictions Weight Bearing Restrictions: No      Mobility Bed Mobility Overal bed mobility: Independent                  Transfers Overall transfer  level: Modified independent                        Balance Overall balance assessment: No apparent balance deficits (not formally assessed)                                         ADL either performed or assessed with clinical judgement   ADL Overall ADL's : Modified independent                                       General ADL Comments: patient is MI for ADLs at this time with patient able to manage portable O2 tank in and out of bathroom with no AD with no LOB. patient was able to simulate shower in standing with 3L/min O2 with education provided on keeping door open and checking O2 line each day to assess for damage or kinks in line. patient verbalized understanding. patient endorsed being at baseline at this time.     Vision Patient Visual Report: No change from baseline Additional Comments: eye surgery scheduled for June 20th     Perception     Praxis      Pertinent Vitals/Pain Pain Assessment Pain Assessment: No/denies pain     Hand Dominance Right   Extremity/Trunk Assessment Upper Extremity Assessment Upper Extremity Assessment: Overall WFL for tasks assessed   Lower Extremity Assessment Lower Extremity Assessment: Defer to PT evaluation   Cervical /  Trunk Assessment Cervical / Trunk Assessment: Normal   Communication Communication Communication: No difficulties   Cognition Arousal/Alertness: Awake/alert Behavior During Therapy: WFL for tasks assessed/performed Overall Cognitive Status: Within Functional Limits for tasks assessed                                       General Comments       Exercises     Shoulder Instructions      Home Living Family/patient expects to be discharged to:: Private residence Living Arrangements: Spouse/significant other Available Help at Discharge: Friend(s) Type of Home: House Home Access: Stairs to enter [employer] of Steps: 4 Entrance  Stairs-Rails: Can reach both Home Layout: One level     Bathroom Shower/Tub: Teacher, early years/pre: Standard Bathroom Accessibility: Yes   Home Equipment: None   Additional Comments: 3L O2 when needed at baseline.      Prior Functioning/Environment Prior Level of Function : Independent/Modified Independent;Driving                        OT Problem List:        OT Treatment/Interventions:      OT Goals(Current goals can be found in the care plan section) Acute Rehab OT Goals OT Goal Formulation: All assessment and education complete, DC therapy  OT Frequency:      Co-evaluation              AM-PAC OT "6 Clicks" Daily Activity     Outcome Measure Help from another person eating meals?: None Help from another person taking care of personal grooming?: None Help from another person toileting, which includes using toliet, bedpan, or urinal?: None Help from another person bathing (including washing, rinsing, drying)?: None Help from another person to put on and taking off regular upper body clothing?: None Help from another person to put on and taking off regular lower body clothing?: None 6 Click Score: 24   End of Session Equipment Utilized During Treatment: Oxygen  Activity Tolerance: Patient tolerated treatment well Patient left: in bed;with call bell/phone within reach  OT Visit Diagnosis: Unsteadiness on feet (R26.81)                Time: [id number] OT Time Calculation (min): 20 min Charges:  OT General Charges $OT Visit: 1 Visit OT Evaluation $OT Eval Low Complexity: 1 Low  [name], MS Acute Rehabilitation Department Office# [phone] Pager# [phone]   [name] 11/13/2021, 2:38 PM

## 2021-11-13 NOTE — Evaluation (Addendum)
Physical Therapy Evaluation Patient Details Name: Aaron Fuller MRN: [id number] DOB: [birth year] Today's Date: 11/13/2021  History of Present Illness  Pt is a 66 y.o. male presenting with acute on chronic diastolic CHF. PMH significant for COPD, chronic hypoxic and hypercarbic respiratory failure, chronic diastolic CHF, paroxysmal atrial flutter on Eliquis, tobacco dependence, alcohol abuse in remission, chronic kidney disease stage III, and hepatitis C.   Clinical Impression  Pt is a 66 y.o. male with above HPI. Pt is supervision-MOD I for all mobility upon eval. Pt ambulated ~175ft in hallway supervision for safety progressing to MOD I with no AD use. Pt with O2 desat while sitting EOB to 85-86% while on RA, placed on 3L [place] and maintained >90% throughout rest of session. Pt has supplemental O2 at home and reports uses it when he feels he needs it. Discussed O2 sats with pt and desats while on RA today. Pt will have assist from friend when needed at home. No further skilled PT needs identified at this time. Pt to continue mobilizing with nursing staff during stay. Please reconsult if mobility status changes/declines. PT will sign off.        Recommendations for follow up therapy are one component of a multi-disciplinary discharge planning process, led by the attending physician.  Recommendations may be updated based on patient status, additional functional criteria and insurance authorization.  Follow Up Recommendations No PT follow up    Assistance Recommended at Discharge None  Patient can return home with the following       Equipment Recommendations None recommended by PT  Recommendations for Other Services       Functional Status Assessment Patient has had a recent decline in their functional status and demonstrates the ability to make significant improvements in function in a reasonable and predictable amount of time.     Precautions / Restrictions Precautions Precaution Comments:  Reports blurry vision - plan for cataract surgery June 20th per pt Restrictions Weight Bearing Restrictions: No      Mobility  Bed Mobility Overal bed mobility: Independent                  Transfers Overall transfer level: Modified independent Equipment used: None               General transfer comment: use of UEs to assist with power up    Ambulation/Gait Ambulation/Gait assistance: Supervision, Modified independent (Device/Increase time) Gait Distance (Feet): 160 Feet Assistive device: None Gait Pattern/deviations: Step-through pattern Gait velocity: decr     General Gait Details: no overt LOB observed. Pt reports that he sometimes fatigues quickly at baseline, uses shopping cart when out in grocery store to lean on.  Stairs            Wheelchair Mobility    Modified Rankin (Stroke Patients Only)       Balance Overall balance assessment: Mild deficits observed, not formally tested                                           Pertinent Vitals/Pain Pain Assessment Pain Assessment: No/denies pain    Home Living Family/patient expects to be discharged to:: Private residence Living Arrangements: Spouse/significant other Available Help at Discharge: Friend(s) Type of Home: House Home Access: Stairs to enter Entrance Stairs-Rails: Can reach both Entrance Stairs-Number of Steps: 4 (side door 3 +1)   Home Layout: One  level Home Equipment: None Additional Comments: 3L O2 when needed at baseline.    Prior Function Prior Level of Function : Independent/Modified Independent;Driving                     Hand Dominance        Extremity/Trunk Assessment   Upper Extremity Assessment Upper Extremity Assessment: Overall WFL for tasks assessed    Lower Extremity Assessment Lower Extremity Assessment: Overall WFL for tasks assessed    Cervical / Trunk Assessment Cervical / Trunk Assessment: Normal  Communication    Communication: No difficulties  Cognition Arousal/Alertness: Awake/alert Behavior During Therapy: WFL for tasks assessed/performed Overall Cognitive Status: Within Functional Limits for tasks assessed                                          General Comments      Exercises     Assessment/Plan    PT Assessment Patient does not need any further PT services  PT Problem List         PT Treatment Interventions      PT Goals (Current goals can be found in the Care Plan section)  Acute Rehab PT Goals Patient Stated Goal: Go home soon PT Goal Formulation: With patient Time For Goal Achievement: 11/27/21 Potential to Achieve Goals: Good    Frequency       Co-evaluation               AM-PAC PT "6 Clicks" Mobility  Outcome Measure Help needed turning from your back to your side while in a flat bed without using bedrails?: None Help needed moving from lying on your back to sitting on the side of a flat bed without using bedrails?: None Help needed moving to and from a bed to a chair (including a wheelchair)?: None Help needed standing up from a chair using your arms (e.g., wheelchair or bedside chair)?: None Help needed to walk in hospital room?: None Help needed climbing 3-5 steps with a railing? : A Little 6 Click Score: 23    End of Session Equipment Utilized During Treatment: Gait belt;Oxygen Activity Tolerance: Patient tolerated treatment well Patient left: in bed;with call bell/phone within reach (sitting EOB, RN aware) Nurse Communication: Mobility status;Other (comment) (O2 sats) PT Visit Diagnosis: Unsteadiness on feet (R26.81)    Time: 0950-1006 PT Time Calculation (min) (ACUTE ONLY): 16 min   Charges:   PT Evaluation $PT Eval Low Complexity: 1 Low          Festus Barren PT, DPT  Acute Rehabilitation Services  Office [phone] 11/13/2021, 1:37 PM

## 2021-11-13 NOTE — TOC Initial Note (Signed)
Transition of Care [facility]) - Initial/Assessment Note    Patient Details  Name: Aaron Fuller MRN: [id number] Date of Birth: [birth year]  [facility] CM/SW Contact:    Golda Acre, RN Phone Number: 11/13/2021, 9:43 AM  Clinical Narrative:                 Request for hhc-home ehart failure screening sent to [facility].  Expected Discharge Plan: Home w Home Health Services Barriers to Discharge: Continued Medical Work up   Patient Goals and CMS Choice Patient states their goals for this hospitalization and ongoing recovery are:: to go home CMS Medicare.gov Compare Post Acute Care list provided to:: Patient    Expected Discharge Plan and Services Expected Discharge Plan: Home w Home Health Services   Discharge Planning Services: CM Consult Post Acute Care Choice: Home Health Living arrangements for the past 2 months: Single Family Home                           HH Arranged: Disease Management HH Agency: [facility] Date [facility] Agency Contacted: 11/13/21 Time HH Agency Contacted: [phone] Representative spoke with at [facility] Agency: [email or web address]  Prior Living Arrangements/Services Living arrangements for the past 2 months: Single Family Home Lives with:: Self Patient language and need for interpreter reviewed:: Yes Do you feel safe going back to the place where you live?: Yes            Criminal Activity/Legal Involvement Pertinent to Current Situation/Hospitalization: No - Comment as needed  Activities of Daily Living Home Assistive Devices/Equipment: Oxygen ADL Screening (condition at time of admission) Patient's cognitive ability adequate to safely complete daily activities?: Yes Is the patient deaf or have difficulty hearing?: No Does the patient have difficulty seeing, even when wearing glasses/contacts?: No Does the patient have difficulty concentrating, remembering, or making decisions?: No Patient able to express need for assistance with ADLs?:  Yes Does the patient have difficulty dressing or bathing?: No Independently performs ADLs?: Yes (appropriate for developmental age) Does the patient have difficulty walking or climbing stairs?: No Weakness of Legs: None Weakness of Arms/Hands: None  Permission Sought/Granted                  Emotional Assessment Appearance:: Appears stated age     Orientation: : Oriented to Place, Oriented to Self, Oriented to  Time, Oriented to Situation Alcohol / Substance Use: Not Applicable Psych Involvement: No (comment)  Admission diagnosis:  Peripheral edema [R60.9] COPD exacerbation (HCC) [J44.1] AKI (acute kidney injury) (HCC) [N17.9] Patient Active Problem List   Diagnosis Date Noted   Emphysema of lung (HCC) 11/12/2021   CKD (chronic kidney disease) stage 3, GFR 30-59 ml/min (HCC) 11/12/2021   Chronic respiratory failure with hypoxia and hypercapnia (HCC) 11/12/2021   Thrombocytopenia (HCC) 11/12/2021   Chronic diastolic CHF (congestive heart failure) (HCC)    Paroxysmal atrial flutter (HCC)    Pulmonary HTN (HCC)    COPD (chronic obstructive pulmonary disease) (HCC)    Acute on chronic diastolic CHF (congestive heart failure) (HCC) 09/28/2019   Alcohol abuse, in remission    Hypertension    Tobacco dependence    Tachycardia    Right ventricular dysfunction    Pulmonary hypertension, unspecified (HCC)    Acute diastolic heart failure (HCC)    Atrial flutter with rapid ventricular response (HCC)    Atrial tachycardia (HCC) 09/27/2019   PCP:  Loura Pardon, PA Pharmacy:  [facility] [address] [place], [place] - [address] [place] [address] Phone: [phone] Fax: [place] [address] [place] [address] Phone: [phone] Fax: [phone]     Social Determinants of Health (SDOH) Interventions    Readmission Risk Interventions     View : No data to  display.

## 2021-11-13 NOTE — Progress Notes (Signed)
PROGRESS NOTE    Aaron Fuller  [id number] DOB: 12/26/1955 DOA: 11/12/2021 PCP: Wynn Banker, PA   Brief Narrative:  66 year old with history of COPD, chronic hypoxia/hypercarbic respiratory failure, diastolic CHF, paroxysmal atrial flutter on Eliquis, tobacco dependence, alcohol use in remission, CKD stage III, hep C came to the ED with complaints of shortness of breath.  Unfortunately patient's multiple siblings passed away over the last few months therefore he quit taking all of his medications and was drinking excessively but stopped about 2 months ago.  He did resume his metoprolol and Eliquis but has not taken his Lasix in several months.  In the ED he was noted to have signs of volume overload with lower extremity swelling, orthopnea, hypoxia, elevated BNP and chest x-ray suggestive of slight volume overload.   Assessment & Plan:  Principal Problem:   Acute on chronic diastolic CHF (congestive heart failure) (HCC) Active Problems:   Paroxysmal atrial flutter (HCC)   Emphysema of lung (HCC)   CKD (chronic kidney disease) stage 3, GFR 30-59 ml/min (HCC)   Chronic respiratory failure with hypoxia and hypercapnia (HCC)   Thrombocytopenia (HCC)    Acute congestive heart failure with preserved ejection fraction, class III.  EF 60%, grade 1 DD Pulmonary hypertension, likely from COPD -He has signs of CHF exacerbation including orthopnea, hypoxia and lower extremity swelling.  BNP elevated.  Echocardiogram in 2021 showed EF 60%, grade 1 DD, elevated PASP and mildly reduced RV function. - Lasix 40 mg IV twice daily.  Continue Toprol-XL 75 mg daily - Repeat echocardiogram ordered - Monitor and replete electrolytes as necessary. -Follows outpatient pulmonary and cardiology.   COPD; chronic hypoxic and hypercarbic respiratory failure  -On home 3 L nasal cannula.  Not in acute exacerbation but will continue using scheduled and as needed bronchodilators.  I-S/flutter valve.    Chronic kidney disease stage IIIa -Baseline creatinine 1.4, admission creatinine 1.83.  Slowly improving with diuretics.  We will continue to closely monitor this.   Paroxysmal atrial flutter -Continue Toprol-XL, Eliquis.  On IV Lopressor as needed.   Thrombocytopenia and leukopenia Alcohol dependence -Suspect from chronic alcohol use and hepatitis C.  No evidence of bleeding and active infection.  We will continue to monitor this -States his last alcoholic drink was over 2 months ago.   History of hepatitis C Patient was seen in GI clinic back in August 2022 but never followed up for treatment.  Would recommend following up with GI/infectious disease outpatient again      DVT prophylaxis: Eliquis Code Status: Full code Family Communication:     Due to signs of volume overload, plan is to continue IV diuretics until euvolemic.  Will likely be the hospital for at least next 24-48 hours.   Subjective: His dyspnea has improved at rest and [facility] orthopnea.  Denies any chest pain.   Examination:  General exam: Appears calm and comfortable  Respiratory system: Bibasilar crackles Cardiovascular system: S1 & S2 heard, RRR. No JVD, murmurs, rubs, gallops or clicks trace bilateral lower extremity pitting edema Gastrointestinal system: Abdomen is nondistended, soft and nontender. No organomegaly or masses felt. Normal bowel sounds heard. Central nervous system: Alert and oriented. No focal neurological deficits. Extremities: Symmetric 5 x 5 power. Skin: No rashes, lesions or ulcers Psychiatry: Judgement and insight appear normal. Mood & affect appropriate.     Objective: Vitals:   11/13/21 0103 11/13/21 0254 11/13/21 0500 11/13/21 0615  BP: (!) 156/106 (!) 145/91  128/89  Pulse: 100 98  (!)  110  Resp: (!) 25 (!) 25  20  Temp: 98.4 F (36.9 C) 98 F (36.7 C)  98.2 F (36.8 C)  TempSrc: Oral Oral    SpO2: 97% 96%  100%  Weight:   73.7 kg   Height:        Intake/Output  Summary (Last 24 hours) at 11/13/2021 0743 Last data filed at 11/13/2021 0600 Gross per 24 hour  Intake --  Output 750 ml  Net -750 ml   Filed Weights   11/12/21 1822 11/13/21 0500  Weight: 74.8 kg 73.7 kg     Data Reviewed:   CBC: Recent Labs  Lab 11/12/21 1838 11/13/21 0347  WBC 3.6* 1.8*  NEUTROABS 1.8 1.7  HGB 15.6 14.6  HCT 47.1 44.5  MCV 94.6 94.7  PLT 110* [id number]*   Basic Metabolic Panel: Recent Labs  Lab 11/12/21 1838 11/13/21 0347  NA 138 139  K 3.9 4.4  CL 96* 99  CO2 37* 31  GLUCOSE 133* 131*  BUN 25* 26*  CREATININE 1.83* 1.56*  CALCIUM 9.0 8.6*  MG  --  1.7   GFR: Estimated Creatinine Clearance: 44.6 mL/min (A) (by C-G formula based on SCr of 1.56 mg/dL (H)). Liver Function Tests: No results for input(s): AST, ALT, ALKPHOS, BILITOT, PROT, ALBUMIN in the last 168 hours. No results for input(s): LIPASE, AMYLASE in the last 168 hours. No results for input(s): AMMONIA in the last 168 hours. Coagulation Profile: No results for input(s): INR, PROTIME in the last 168 hours. Cardiac Enzymes: No results for input(s): CKTOTAL, CKMB, CKMBINDEX, TROPONINI in the last 168 hours. BNP (last 3 results) No results for input(s): PROBNP in the last 8760 hours. HbA1C: No results for input(s): HGBA1C in the last 72 hours. CBG: No results for input(s): GLUCAP in the last 168 hours. Lipid Profile: No results for input(s): CHOL, HDL, LDLCALC, TRIG, CHOLHDL, LDLDIRECT in the last 72 hours. Thyroid Function Tests: No results for input(s): TSH, T4TOTAL, FREET4, T3FREE, THYROIDAB in the last 72 hours. Anemia Panel: No results for input(s): VITAMINB12, FOLATE, FERRITIN, TIBC, IRON, RETICCTPCT in the last 72 hours. Sepsis Labs: No results for input(s): PROCALCITON, LATICACIDVEN in the last 168 hours.  Recent Results (from the past 240 hour(s))  Resp Panel by RT-PCR (Flu A&B, Covid) Nasopharyngeal Swab     Status: None   Collection Time: 11/12/21  6:38 PM   Specimen:  Nasopharyngeal Swab; Nasopharyngeal(NP) swabs in vial transport medium  Result Value Ref Range Status   SARS Coronavirus 2 by RT PCR NEGATIVE NEGATIVE Final    Comment: (NOTE) SARS-CoV-2 target nucleic acids are NOT DETECTED.  The SARS-CoV-2 RNA is generally detectable in upper respiratory specimens during the acute phase of infection. The lowest concentration of SARS-CoV-2 viral copies this assay can detect is 138 copies/mL. A negative result does not preclude SARS-Cov-2 infection and should not be used as the sole basis for treatment or other patient management decisions. A negative result may occur with  improper specimen collection/handling, submission of specimen other than nasopharyngeal swab, presence of viral mutation(s) within the areas targeted by this assay, and inadequate number of viral copies(<138 copies/mL). A negative result must be combined with clinical observations, patient history, and epidemiological information. The expected result is Negative.  Fact Sheet for Patients:  EntrepreneurPulse.com.au  Fact Sheet for Healthcare Providers:  IncredibleEmployment.be  This test is no t yet approved or cleared by the [place] FDA and  has been authorized for detection and/or diagnosis of SARS-CoV-2 by  FDA under an Emergency Use Authorization (EUA). This EUA will remain  in effect (meaning this test can be used) for the duration of the COVID-19 declaration under Section 564(b)(1) of the Act, 21 U.S.C.section 360bbb-3(b)(1), unless the authorization is terminated  or revoked sooner.       Influenza A by PCR NEGATIVE NEGATIVE Final   Influenza B by PCR NEGATIVE NEGATIVE Final    Comment: (NOTE) The Xpert Xpress SARS-CoV-2/FLU/RSV plus assay is intended as an aid in the diagnosis of influenza from Nasopharyngeal swab specimens and should not be used as a sole basis for treatment. Nasal washings and aspirates are unacceptable for  Xpert Xpress SARS-CoV-2/FLU/RSV testing.  Fact Sheet for Patients: EntrepreneurPulse.com.au  Fact Sheet for Healthcare Providers: IncredibleEmployment.be  This test is not yet approved or cleared by the [place] FDA and has been authorized for detection and/or diagnosis of SARS-CoV-2 by FDA under an Emergency Use Authorization (EUA). This EUA will remain in effect (meaning this test can be used) for the duration of the COVID-19 declaration under Section 564(b)(1) of the Act, 21 U.S.C. section 360bbb-3(b)(1), unless the authorization is terminated or revoked.  Performed at [facility], [address] [place], [place] [address]          Radiology Studies: DG Chest [place] 1 View  Result Date: 11/12/2021 CLINICAL DATA:  Shortness of breath EXAM: PORTABLE CHEST 1 VIEW COMPARISON:  10/01/2019 FINDINGS: The heart is slightly enlarged. No focal opacity, pleural effusion, or pneumothorax. Emphysema. IMPRESSION: 1. Hyperinflation with emphysema 2. Mild cardiomegaly. Electronically Signed   By: Donavan Foil M.D.   On: 11/12/2021 19:17        Scheduled Meds:  apixaban  5 mg Oral BID   atorvastatin  20 mg Oral q1800   furosemide  40 mg Intravenous Q12H   metoprolol succinate  75 mg Oral Daily   nicotine  14 mg Transdermal Daily   sodium chloride flush  3 mL Intravenous Q12H   umeclidinium bromide  1 puff Inhalation Daily   Continuous Infusions:   LOS: 0 days   Time spent= 35 mins    Aarvi Stotts Arsenio Loader, MD [facility]  If 7PM-7AM, please contact night-coverage  11/13/2021, 7:43 AM

## 2021-11-14 ENCOUNTER — Inpatient Hospital Stay (HOSPITAL_COMMUNITY): Payer: Medicare Other

## 2021-11-14 DIAGNOSIS — I5033 Acute on chronic diastolic (congestive) heart failure: Secondary | ICD-10-CM | POA: Diagnosis not present

## 2021-11-14 DIAGNOSIS — R609 Edema, unspecified: Secondary | ICD-10-CM | POA: Diagnosis not present

## 2021-11-14 DIAGNOSIS — J441 Chronic obstructive pulmonary disease with (acute) exacerbation: Secondary | ICD-10-CM | POA: Diagnosis not present

## 2021-11-14 DIAGNOSIS — J9611 Chronic respiratory failure with hypoxia: Secondary | ICD-10-CM | POA: Diagnosis not present

## 2021-11-14 DIAGNOSIS — I4892 Unspecified atrial flutter: Secondary | ICD-10-CM | POA: Diagnosis not present

## 2021-11-14 LAB — APTT: aPTT: 35 seconds (ref 24–36)

## 2021-11-14 LAB — CBC
HCT: 43 % (ref 39.0–52.0)
Hemoglobin: 14 g/dL (ref 13.0–17.0)
MCH: 31 pg (ref 26.0–34.0)
MCHC: 32.6 g/dL (ref 30.0–36.0)
MCV: 95.3 fL (ref 80.0–100.0)
Platelets: 107 10*3/uL — ABNORMAL LOW (ref 150–400)
RBC: 4.51 MIL/uL (ref 4.22–5.81)
RDW: 17.1 % — ABNORMAL HIGH (ref 11.5–15.5)
WBC: 5.4 10*3/uL (ref 4.0–10.5)
nRBC: 0.4 % — ABNORMAL HIGH (ref 0.0–0.2)

## 2021-11-14 LAB — BASIC METABOLIC PANEL
Anion gap: 7 (ref 5–15)
BUN: 34 mg/dL — ABNORMAL HIGH (ref 8–23)
CO2: 32 mmol/L (ref 22–32)
Calcium: 8.6 mg/dL — ABNORMAL LOW (ref 8.9–10.3)
Chloride: 99 mmol/L (ref 98–111)
Creatinine, Ser: 2.46 mg/dL — ABNORMAL HIGH (ref 0.61–1.24)
GFR, Estimated: 28 mL/min — ABNORMAL LOW (ref >60)
Glucose, Bld: 103 mg/dL — ABNORMAL HIGH (ref 70–99)
Potassium: 4.5 mmol/L (ref 3.5–5.1)
Sodium: 138 mmol/L (ref 135–145)

## 2021-11-14 LAB — HEPARIN LEVEL (UNFRACTIONATED): Heparin Unfractionated: >1.1 IU/mL — ABNORMAL HIGH (ref 0.30–0.70)

## 2021-11-14 LAB — MAGNESIUM: Magnesium: 1.7 mg/dL (ref 1.7–2.4)

## 2021-11-14 MED ORDER — HEPARIN (PORCINE) 25000 UT/250ML-% IV SOLN
1100.0000 [IU]/h | INTRAVENOUS | Status: DC
  Administered 2021-11-14: 1100 [IU]/h via INTRAVENOUS
  Filled 2021-11-14: qty 250

## 2021-11-14 MED ORDER — UMECLIDINIUM-VILANTEROL 62.5-25 MCG/ACT IN AEPB
1.0000 | INHALATION_SPRAY | Freq: Every day | RESPIRATORY_TRACT | Status: DC
  Administered 2021-11-14 – 2021-11-18 (×4): 1 via RESPIRATORY_TRACT
  Filled 2021-11-14 (×3): qty 14

## 2021-11-14 NOTE — Consult Note (Addendum)
Cardiology Consultation:   Patient ID: Aaron Fuller MRN: [id number]; DOB: [birth year]  Admit date: 11/12/2021 Date of Consult: 11/14/2021  PCP:  Wynn Banker, PA   [facility] Providers Cardiologist:  Fransico Him, MD     Patient Profile:   Aaron Fuller is a 66 y.o. male with a hx of chronic diastolic heart failure, EtOH/tobacco use, paroxysmal atrial flutter, hypertension, normal course on cath/12/2019, pulmonary hypertension on RHC, COPD, celiac artery stenosis and a history of hepatitis C who is being seen 11/14/2021 for the evaluation of abnormal echocardiogram at the request of Dr. Reesa Chew.  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  He has also managed to quit drinking during this period as well.  Echocardiogram obtained on 09/28/2019 showed EF 55 to 60%, grade 1 DD, RVSP 54.7 mmHg, moderate RAE, moderate AI, mild TR.  Left and right heart cath performed on 09/29/2019 demonstrated normal coronary arteries, moderately elevated right heart pressures with PA pressure 63/28, mean pressure of 39 consistent with at least moderate pulmonary hypertension, cardiac output 5.9, cardiac index 3.2.  PFT obtained on 10/01/2019 demonstrated severe obstructive airway disease with FEV1 only at 20%, FVC 37%, FEV1/FVC ratio 52%.  Patient was last seen by Dr. Radford Pax in January 2022, he was on Eliquis, metoprolol succinate 75 mg daily, and furosemide 20 mg tablet.  Per patient, he has been taking 20 mg twice daily at home until a few months ago.  Unfortunately, out of 6 siblings, he lost 2 brothers and 3 sisters within a short time of 4 to 5 months.  This has caused him to  resumed alcohol use in the past few months.  Per patient, he has not used any Lasix for at least 3 months.  He stopped his other cardiac medication as well however resumed Eliquis and metoprolol in the past few months.  He smoked half a pack per day.  He presented to his PCPs office on 11/12/2021 for evaluation of shortness of breath and found to have O2 saturation of 70s in the primary care clinic.  He was subsequently sent to [facility] long ED for further evaluation.  On arrival, BNP was elevated to 679, creatinine 1.83, platelet 110,000.  Patient was found to be mildly volume overloaded on arrival and was given IV Lasix.  Renal function initially improved with IV diuresis as creatinine went down to 1.56 on 5/23, however by the following morning, creatinine shot up to 2.46.  White blood cell count was initially low at 3.6 on arrival, went down to 1.8 by 5/23 before normalized on 5/24 to 5.4.  Lasix was held, cardiology service was consulted.  Chest x-ray showed hyperinflation with emphysema and mild cardiomegaly.  Repeat echocardiogram obtained on 11/13/2021 demonstrated EF 40 to 45%, grade 2 DD, severe LVH, flattened interventricular septum during diastole consistent with RV volume overload, RVSP 51.7 mmHg, trivial MR, mild to moderate AI, severe TR.  Consideration for outpatient amyloid evaluation was recommended.  Cardiology service consulted due to abnormal echocardiogram and rising creatinine with diuresis.   Past Medical History:  Diagnosis Date   Alcohol dependence ([place])    Chronic diastolic CHF (congestive heart failure) ([place])  normal coronary arteries on cath   COPD (chronic obstructive pulmonary disease) ([facility])    Hypertension    Paroxysmal atrial flutter ([facility])    Pulmonary HTN ([facility])    secondary to COPD   Tobacco dependence     Past Surgical History:  Procedure Laterality Date   RIGHT/LEFT HEART CATH AND CORONARY ANGIOGRAPHY Bilateral 09/29/2019   Procedure: RIGHT/LEFT HEART CATH AND  CORONARY ANGIOGRAPHY;  Surgeon: Troy Sine, MD;  Location: [place] [facility];  Service: Cardiovascular;  Laterality: Bilateral;   ulcer surgery       Home Medications:  Prior to Admission medications   Medication Sig Start Date End Date Taking? Authorizing Provider  apixaban (ELIQUIS) 5 MG TABS tablet Take 1 tablet (5 mg total) by mouth 2 (two) times daily. 06/14/20  Yes Turner, Eber Hong, MD  atorvastatin (LIPITOR) 20 MG tablet Take 1 tablet (20 mg total) by mouth daily at 6 PM. Patient taking differently: Take 20 mg by mouth daily. 11/10/19  Yes Sueanne Margarita, MD  fluticasone-salmeterol (ADVAIR HFA) 115-21 MCG/ACT inhaler Inhale 2 puffs into the lungs 2 (two) times daily as needed (wheezing, shortness of breath).   Yes [provider]  folic acid (FOLVITE) 1 MG tablet Take 1 tablet (1 mg total) by mouth daily. 10/04/19  Yes Hongalgi, Lenis Dickinson, MD  furosemide (LASIX) 20 MG tablet Take 1 tablet (20 mg total) by mouth daily. 11/10/19  Yes Turner, Eber Hong, MD  metoprolol succinate (TOPROL-XL) 100 MG 24 hr tablet Take 100 mg by mouth daily. Take with or immediately following a meal.   Yes [provider]  Multiple Vitamin (MULTIVITAMIN WITH MINERALS) TABS tablet Take 1 tablet by mouth daily. 10/04/19  Yes Hongalgi, Lenis Dickinson, MD  thiamine 100 MG tablet Take 1 tablet (100 mg total) by mouth daily. 10/04/19  Yes Hongalgi, Lenis Dickinson, MD  albuterol (VENTOLIN HFA) 108 (90 Base) MCG/ACT inhaler Inhale 2 puffs into the lungs every 6 (six) hours as needed for wheezing or shortness of breath. Patient not taking: Reported on 11/13/2021 10/03/19   Modena Jansky, MD  metoprolol succinate (TOPROL XL) 50 MG 24 hr tablet Take 1.5 tablets (75 mg total) by mouth daily. Take with or immediately following a meal. Patient not taking: Reported on 11/13/2021 11/08/19   Sueanne Margarita, MD  nicotine (NICODERM CQ - DOSED IN MG/24 HOURS) 14 mg/24hr patch Place 1 patch (14 mg total) onto the skin  daily. Patient not taking: Reported on 11/13/2021 10/04/19   Modena Jansky, MD  Tiotropium Bromide Monohydrate (SPIRIVA RESPIMAT) 2.5 MCG/ACT AERS Inhale 2 puffs into the lungs daily. Patient not taking: Reported on 11/13/2021 08/23/20   Freddi Starr, MD    Inpatient Medications: Scheduled Meds:  apixaban  5 mg Oral BID   arformoterol  15 mcg Nebulization BID   atorvastatin  20 mg Oral q1800   metoprolol succinate  75 mg Oral Daily   nicotine  14 mg Transdermal Daily   sodium chloride flush  3 mL Intravenous Q12H   umeclidinium bromide  1 puff Inhalation Daily   Continuous Infusions:  PRN Meds: acetaminophen **OR** acetaminophen, guaiFENesin, hydrALAZINE, ipratropium-albuterol, metoprolol tartrate, ondansetron **OR** ondansetron (ZOFRAN) IV, senna-docusate, traZODone  Allergies:   No Active Allergies  Social History:   Social History   Socioeconomic History   Marital status: Single    Spouse name: Not on file   Number of children: Not on file   Years of education: Not on file  Highest education level: Not on file  Occupational History   Occupation: retired Biomedical scientist  Tobacco Use   Smoking status: Every Day    Packs/day: 1.00    Years: 49.00    Pack years: 49.00    Types: Cigarettes   Smokeless tobacco: Never   Tobacco comments:    currently 0.5  Vaping Use   Vaping Use: Never used  Substance and Sexual Activity   Alcohol use: Not Currently   Drug use: Not Currently   Sexual activity: Not on file  Other Topics Concern   Not on file  Social History Narrative   Not on file   Social Determinants of Health   Financial Resource Strain: Not on file  Food Insecurity: Not on file  Transportation Needs: Not on file  Physical Activity: Not on file  Stress: Not on file  Social Connections: Not on file  Intimate Partner Violence: Not on file    Family History:    Family History  Problem Relation Age of Onset   CAD Mother    Diabetes Mother    Heart failure  Mother    CAD Father    Diabetes Sister      ROS:  Please see the history of present illness.   All other ROS reviewed and negative.     Physical Exam/Data:   Vitals:   11/13/21 1428 11/13/21 2044 11/14/21 0434 11/14/21 0500  BP: 112/82 107/81 95/70   Pulse: 84 84 76   Resp: 18 20 20    Temp: 97.8 F (36.6 C) 98 F (36.7 C) 98.1 F (36.7 C)   TempSrc: Oral Oral Oral   SpO2: 98% 100% 92%   Weight:    75.5 kg  Height:        Intake/Output Summary (Last 24 hours) at 11/14/2021 [id number] Last data filed at 11/13/2021 2130 Gross per 24 hour  Intake 1227 ml  Output 575 ml  Net 652 ml      11/14/2021    5:00 AM 11/13/2021    5:00 AM 11/12/2021    6:22 PM  Last 3 Weights  Weight (lbs) 166 lb 7.2 oz 162 lb 7.7 oz 165 lb  Weight (kg) 75.5 kg 73.7 kg 74.844 kg     Body mass index is 24.94 kg/m.  General:  Well nourished, well developed, in no acute distress HEENT: normal Neck: no JVD Vascular: No carotid bruits; Distal pulses 2+ bilaterally Cardiac:  normal S1, S2; RRR; no murmur  Lungs:  clear to auscultation bilaterally, no wheezing, rhonchi or rales  Abd: soft, nontender, no hepatomegaly  Ext: no edema Musculoskeletal:  No deformities, BUE and BLE strength normal and equal Skin: warm and dry  Neuro:  CNs 2-12 intact, no focal abnormalities noted Psych:  Normal affect   EKG:  The EKG was personally reviewed and demonstrates:   Normal sinus rhythm, no significant ST-T wave changes Telemetry:  Telemetry was personally reviewed and demonstrates: Normal sinus rhythm, heart rate 70s, no ventricular ectopy  Relevant CV Studies:  Echo 09/28/2019 1. Left ventricular ejection fraction, by estimation, is 55 to 60%. The  left ventricle has normal function. The left ventricle has no regional  wall motion abnormalities. There is mild concentric left ventricular  hypertrophy. Left ventricular diastolic  parameters are consistent with Grade I diastolic dysfunction (impaired   relaxation).   2. Right ventricular systolic function is moderately reduced. The right  ventricular size is moderately enlarged. There is moderately elevated  pulmonary artery systolic pressure. The  estimated right ventricular  systolic pressure is [id number] mmHg.   3. Right atrial size was moderately dilated.   4. The mitral valve is normal in structure. No evidence of mitral valve  regurgitation.   5. The aortic valve is tricuspid. Aortic valve regurgitation is moderate.  No aortic stenosis is present.   6. The inferior vena cava is dilated in size with <50% respiratory  variability, suggesting right atrial pressure of 15 mmHg.    Echo 11/13/2021 1. Left ventricular ejection fraction, by estimation, is 40 to 45%. The  left ventricle has mildly decreased function. The left ventricle  demonstrates global hypokinesis. There is severe concentric left  ventricular hypertrophy. Left ventricular diastolic   parameters are consistent with Grade II diastolic dysfunction  (pseudonormalization). There is the interventricular septum is flattened  in diastole ('D' shaped left ventricle), consistent with right ventricular  volume overload.   2. Right ventricular systolic function is moderately reduced. The right  ventricular size is moderately enlarged. Mildly increased right  ventricular wall thickness. There is moderately elevated pulmonary artery  systolic pressure. The estimated right  ventricular systolic pressure is [id number] mmHg.   3. Left atrial size was mildly dilated.   4. Right atrial size was severely dilated.   5. A small pericardial effusion is present. No evidence of tamponade.   6. The mitral valve is grossly normal. Trivial mitral valve  regurgitation.   7. Tricuspid valve regurgitation is severe.   8. The aortic valve is tricuspid. Aortic valve regurgitation is mild to  moderate.   9. Pulmonic valve regurgitation is moderate.  10. Moderately dilated pulmonary artery.  11. The  inferior vena cava is dilated in size with <50% respiratory  variability, suggesting right atrial pressure of 15 mmHg.   Comparison(s): Significant changes: worsening LVEF with torrential,  functional tricuspid regurgitation.   Conclusion(s)/Recommendation(s): Notable hypertrophy, atrial dilation, and  effusion: amyloid nuclear imaging may be indicated as outpatient.   Laboratory Data:  High Sensitivity Troponin:  No results for input(s): TROPONINIHS in the last 720 hours.   Chemistry Recent Labs  Lab 11/12/21 1838 11/13/21 0347 11/14/21 0357  NA 138 139 138  K 3.9 4.4 4.5  CL 96* 99 99  CO2 37* 31 32  GLUCOSE 133* 131* 103*  BUN 25* 26* 34*  CREATININE 1.83* 1.56* 2.46*  CALCIUM 9.0 8.6* 8.6*  MG  --  1.7 1.7  GFRNONAA 40* 48* 28*  ANIONGAP 5 9 7     No results for input(s): PROT, ALBUMIN, AST, ALT, ALKPHOS, BILITOT in the last 168 hours. Lipids No results for input(s): CHOL, TRIG, HDL, LABVLDL, LDLCALC, CHOLHDL in the last 168 hours.  Hematology Recent Labs  Lab 11/12/21 1838 11/13/21 0347 11/14/21 0357  WBC 3.6* 1.8* 5.4  RBC 4.98 4.70 4.51  HGB 15.6 14.6 14.0  HCT 47.1 44.5 43.0  MCV 94.6 94.7 95.3  MCH 31.3 31.1 31.0  MCHC 33.1 32.8 32.6  RDW 17.8* 17.1* 17.1*  PLT 110* 101* 107*   Thyroid No results for input(s): TSH, FREET4 in the last 168 hours.  BNP Recent Labs  Lab 11/12/21 1838  BNP 678.8*    DDimer No results for input(s): DDIMER in the last 168 hours.   Radiology/Studies:  DG Chest Port 1 View  Result Date: 11/12/2021 CLINICAL DATA:  Shortness of breath EXAM: PORTABLE CHEST 1 VIEW COMPARISON:  10/01/2019 FINDINGS: The heart is slightly enlarged. No focal opacity, pleural effusion, or pneumothorax. Emphysema. IMPRESSION: 1. Hyperinflation with emphysema 2. Mild  cardiomegaly. Electronically Signed   By: Donavan Foil M.D.   On: 11/12/2021 19:17   ECHOCARDIOGRAM COMPLETE  Result Date: 11/13/2021    ECHOCARDIOGRAM REPORT   Patient Name:   Aaron Fuller Date of Exam: 11/13/2021 Medical Rec #:  [id number]  Height:       68.5 in Accession #:    [id number] Weight:       162.5 lb Date of Birth:  [birth year]  BSA:          1.881 m Patient Age:    5 years   BP:           128/89 mmHg Patient Gender: M          HR:           89 bpm. Exam Location:  Inpatient Procedure: 2D Echo, Cardiac Doppler and Color Doppler Indications:    Cardiomyopathy  History:        Patient has prior history of Echocardiogram examinations, most                 recent 09/28/2019. CHF, COPD; Arrythmias:Tachycardia.  Sonographer:    Jefferey Pica Referring Phys: [id number] ANKIT CHIRAG AMIN IMPRESSIONS  1. Left ventricular ejection fraction, by estimation, is 40 to 45%. The left ventricle has mildly decreased function. The left ventricle demonstrates global hypokinesis. There is severe concentric left ventricular hypertrophy. Left ventricular diastolic  parameters are consistent with Grade II diastolic dysfunction (pseudonormalization). There is the interventricular septum is flattened in diastole ('D' shaped left ventricle), consistent with right ventricular volume overload.  2. Right ventricular systolic function is moderately reduced. The right ventricular size is moderately enlarged. Mildly increased right ventricular wall thickness. There is moderately elevated pulmonary artery systolic pressure. The estimated right ventricular systolic pressure is [id number] mmHg.  3. Left atrial size was mildly dilated.  4. Right atrial size was severely dilated.  5. A small pericardial effusion is present. No evidence of tamponade.  6. The mitral valve is grossly normal. Trivial mitral valve regurgitation.  7. Tricuspid valve regurgitation is severe.  8. The aortic valve is tricuspid. Aortic valve regurgitation is mild to moderate.  9. Pulmonic valve regurgitation is moderate. 10. Moderately dilated pulmonary artery. 11. The inferior vena cava is dilated in size with <50% respiratory variability, suggesting right  atrial pressure of 15 mmHg. Comparison(s): Significant changes: worsening LVEF with torrential, functional tricuspid regurgitation. Conclusion(s)/Recommendation(s): Notable hypertrophy, atrial dilation, and effusion: amyloid nuclear imaging may be indicated as outpatient. FINDINGS  Left Ventricle: Left ventricular ejection fraction, by estimation, is 40 to 45%. The left ventricle has mildly decreased function. The left ventricle demonstrates global hypokinesis. The left ventricular internal cavity size was small. There is severe concentric left ventricular hypertrophy. The interventricular septum is flattened in diastole ('D' shaped left ventricle), consistent with right ventricular volume overload. Left ventricular diastolic parameters are consistent with Grade II diastolic dysfunction (pseudonormalization). Right Ventricle: The right ventricular size is moderately enlarged. Mildly increased right ventricular wall thickness. Right ventricular systolic function is moderately reduced. There is moderately elevated pulmonary artery systolic pressure. The tricuspid regurgitant velocity is 3.03 m/s, and with an assumed right atrial pressure of 15 mmHg, the estimated right ventricular systolic pressure is [id number] mmHg. Left Atrium: Left atrial size was mildly dilated. Right Atrium: Right atrial size was severely dilated. Pericardium: A small pericardial effusion is present. Mitral Valve: The mitral valve is grossly normal. Trivial mitral valve regurgitation. Tricuspid Valve: The tricuspid valve is normal in structure. Tricuspid valve  regurgitation is severe. Aortic Valve: The aortic valve is tricuspid. Aortic valve regurgitation is mild to moderate. Aortic regurgitation PHT measures 667 msec. Aortic valve peak gradient measures 8.1 mmHg. Pulmonic Valve: The pulmonic valve was normal in structure. Pulmonic valve regurgitation is moderate. No evidence of pulmonic stenosis. Aorta: The aortic root and ascending aorta are  structurally normal, with no evidence of dilitation. Pulmonary Artery: The pulmonary artery is moderately dilated. Venous: The inferior vena cava is dilated in size with less than 50% respiratory variability, suggesting right atrial pressure of 15 mmHg. IAS/Shunts: There is right bowing of the interatrial septum, suggestive of elevated left atrial pressure. No atrial level shunt detected by color flow Doppler.  LEFT VENTRICLE PLAX 2D LVIDd:         3.15 cm      Diastology LVIDs:         2.60 cm      LV e' medial:    7.54 cm/s LV PW:         2.05 cm      LV E/e' medial:  4.5 LV IVS:        1.82 cm      LV e' lateral:   7.51 cm/s LVOT diam:     1.80 cm      LV E/e' lateral: 4.5 LV SV:         55 LV SV Index:   29 LVOT Area:     2.54 cm  LV Volumes (MOD) LV vol d, MOD A4C: 119.0 ml LV vol s, MOD A4C: 84.2 ml LV SV MOD A4C:     119.0 ml RIGHT VENTRICLE          IVC RV Basal diam:  4.40 cm  IVC diam: 3.10 cm RV Mid diam:    5.00 cm TAPSE (M-mode): 1.6 cm LEFT ATRIUM             Index        RIGHT ATRIUM           Index LA diam:        4.40 cm 2.34 cm/m   RA Area:     30.60 cm LA Vol (A2C):   69.3 ml 36.84 ml/m  RA Volume:   131.00 ml 69.64 ml/m LA Vol (A4C):   47.7 ml 25.36 ml/m LA Biplane Vol: 60.0 ml 31.89 ml/m  AORTIC VALVE                 PULMONIC VALVE AV Area (Vmax): 2.51 cm     PV Vmax:       0.90 m/s AV Vmax:        142.00 cm/s  PV Peak grad:  3.2 mmHg AV Peak Grad:   8.1 mmHg LVOT Vmax:      140.00 cm/s LVOT Vmean:     79.400 cm/s LVOT VTI:       0.218 m AI PHT:         667 msec  AORTA Ao Asc diam: 3.50 cm MITRAL VALVE               TRICUSPID VALVE MV Area (PHT): 5.16 cm    TR Peak grad:   36.7 mmHg MV Decel Time: 147 msec    TR Vmax:        303.00 cm/s MV E velocity: 33.80 cm/s MV A velocity: 75.00 cm/s  SHUNTS MV E/A ratio:  0.45        Systemic VTI:  0.22 m  Systemic Diam: 1.80 cm Rudean Haskell MD Electronically signed by Rudean Haskell MD Signature Date/Time:  11/13/2021/12:40:01 PM    Final      Assessment and Plan:   Acute on chronic combined systolic and diastolic heart failure:     -Normally ejection fraction on echocardiogram in 2021, normal coronary arteries with pulmonary hypertension on previous right heart cath in 2021.  -Recently stopped all cardiac medications for several months due to the passing of 2 brothers and 3 sisters within a span of 4 to 5 months.  Resumed metoprolol succinate and Eliquis a few months ago, however Eliquis was never resumed.  -Felt to be volume overloaded on arrival, treated with 40 mg IV twice daily of Lasix, creatinine initially improved to 1.5 however jumped to 2.4 this morning.  -Hold Lasix for now, patient appears to be euvolemic on exam.  Dr. Johnsie Cancel has discussed with the patient possibility of doing a left and right heart cath this Friday, he wished to think about it at this point.  We will tentatively put the patient on the schedule for left and right heart cath this Friday, reassess tomorrow.  Hold Eliquis, switch to IV heparin in anticipation of possible cath.   Acute on chronic renal insufficiency: Creatinine 1.83-->1.56-->2.46  -MD to consider if need to obtain a renal artery Doppler given the drastic jump in the creatinine.  Patient was referred to vascular surgery last year for evaluation of celiac artery stenosis.  EtOH and tobacco use: He smokes half a pack per day.  Per patient, he previously managed to quit drinking for many years, however recently resumed alcohol after the passing away of multiple siblings  Paroxysmal atrial flutter: On Eliquis and metoprolol succinate at home  Hypertension  History of pulmonary hypertension  Severe COPD: Followed by pulmonology service.        For questions or updates, please contact [place] Please consult [email or web address] for contact info under    Signed, Almyra Deforest, [place]  11/14/2021 8:12 AM  Patient examined chart reviewed Discussed care with  patient/PA He does not seem very interactive Primary issue is smoking significant COPD and likely WHO Class 3 pulmonary HTN.  LVEF newly reduced 40-45% Discussed need for at least right heart cath Friday and possible Rx pulmonary HTN with revatio or Opsumit He is not sure he wants to proceed. Hold diuretics with bump in Cr Renal duplex transition to heparin given possible cath Friday May benefit from inpatient pulmonary consult He says he wears oxygen at home but not wearing in room this am   Jenkins Rouge MD [facility]

## 2021-11-14 NOTE — Plan of Care (Signed)
  Problem: Education: Goal: Knowledge of General Education information will improve Description: Including pain rating scale, medication(s)/side effects and non-pharmacologic comfort measures Outcome: Progressing   Problem: Health Behavior/Discharge Planning: Goal: Ability to manage health-related needs will improve Outcome: Progressing   Problem: Clinical Measurements: Goal: Ability to maintain clinical measurements within normal limits will improve Outcome: Progressing Goal: Will remain free from infection Outcome: Progressing Goal: Diagnostic test results will improve Outcome: Progressing Goal: Respiratory complications will improve Outcome: Progressing Goal: Cardiovascular complication will be avoided Outcome: Progressing   Problem: Activity: Goal: Risk for activity intolerance will decrease Outcome: Progressing   Problem: Nutrition: Goal: Adequate nutrition will be maintained Outcome: Progressing   Problem: Coping: Goal: Level of anxiety will decrease Outcome: Progressing   Problem: Elimination: Goal: Will not experience complications related to bowel motility Outcome: Progressing Goal: Will not experience complications related to urinary retention Outcome: Progressing   Problem: Pain Managment: Goal: General experience of comfort will improve Outcome: Progressing   Problem: Safety: Goal: Ability to remain free from injury will improve Outcome: Progressing   Problem: Skin Integrity: Goal: Risk for impaired skin integrity will decrease Outcome: Progressing   Problem: Education: Goal: Ability to demonstrate management of disease process will improve Outcome: Progressing Goal: Ability to verbalize understanding of medication therapies will improve Outcome: Progressing Goal: Individualized Educational Video(s) Outcome: Progressing   Problem: Activity: Goal: Capacity to carry out activities will improve Outcome: Progressing   Problem: Cardiac: Goal:  Ability to achieve and maintain adequate cardiopulmonary perfusion will improve Outcome: Progressing   Problem: Education: Goal: Ability to demonstrate management of disease process will improve Outcome: Progressing Goal: Ability to verbalize understanding of medication therapies will improve Outcome: Progressing Goal: Individualized Educational Video(s) Outcome: Progressing   Problem: Activity: Goal: Capacity to carry out activities will improve Outcome: Progressing   Problem: Cardiac: Goal: Ability to achieve and maintain adequate cardiopulmonary perfusion will improve Outcome: Progressing   

## 2021-11-14 NOTE — Consult Note (Signed)
NAME:  Aaron Fuller, MRN:  [id number], DOB:  21-Aug-1955, LOS: 1 ADMISSION DATE:  11/12/2021, CONSULTATION DATE:  11/14/21 REFERRING MD:  Dr. 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 He has suffered multiple family losses recently which has sent him into a depression with increased alcohol use.  PCCM has been consulted for further evaluation of his COPD and pulmonary hypertension.   He was last seen in pulmonary clinic 08/23/20 and started on spiriva 2.22mcg 2 puffs daily but has not been on any inhalers since he ran out of the sample. He reported it did help his breathing. He was ordered for sleep study which was never completed. He has been using his oxygen as needed.  Echo 11/13/21 shows EF 40-45% with severe concentric hypertrophy. Grade II diastolic dysfunction. RV systolic function is moderately reduced and moderately enlarged. RVSP estimated 51.7 mmHg. Left atrial size is mildly dilated. Right atrial size is severely dilated. Moderately dilated pulmonary artery.  CXR 11/12/21 enlarged heart, no focal opacities or infiltrates. No pleural effusions.  Pertinent  Medical History   Past Medical History:  Diagnosis Date   Alcohol dependence ([place])    Chronic diastolic CHF (congestive heart failure) (HCC)    normal coronary arteries on cath   COPD (chronic obstructive pulmonary disease) (HCC)    Hypertension    Paroxysmal atrial flutter ([place])    Pulmonary HTN ([place])    secondary to COPD   Tobacco dependence    Significant Hospital Events: Including procedures, antibiotic start and stop dates in addition to other pertinent events   5/22 admitted for acute heart failure  Interim  History / Subjective:  As above  Objective   Blood pressure 95/70, pulse 76, temperature 98.1 F (36.7 C), temperature source Oral, resp. rate 20, height 5' 8.5" (1.74 m), weight 75.5 kg, SpO2 92 %.        Intake/Output Summary (Last 24 hours) at 11/14/2021 0801 Last data filed at 11/13/2021 2130 Gross per 24 hour  Intake 1227 ml  Output 575 ml  Net 652 ml   Filed Weights   11/12/21 1822 11/13/21 0500 11/14/21 0500  Weight: 74.8 kg 73.7 kg 75.5 kg   Examination: General: elderly male, no acute distress, resting in bed HENT: [place]/AT, moist mucous membranes, sclera anicteric Lungs: clear to auscultation, diminished air movement, no wheezing Cardiovascular: rrr, no murmurs Abdomen: soft, non-tender, non-distended, BS+ Extremities: RLE edema present Neuro: A&O x 3, moving all extremities GU: n/a  Resolved Hospital Problem list     Assessment & Plan:  Acute Hypoxemic Respiratory Failure Pulmonary Hypertension Acute on Chronic Systolic and Diastolic Heart Failure Paroxysmal Atrial Flutter COPD/Emphysema Nocturnal Hypoxemia Tobacco Dependence  Plan: - Start Anoro ellipta 1 puff daily for COPD treatment - PRN duonebs - Check lower extremity dopplers for R > L lower extremity edema - Agree with obtaining RHC per cardiology to obtain updated pressure measurements - The pulmonary hypertension 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 He would benefit from in lab sleep study after admission.  - I have counseled him on the importance of smoking cessation. He is on nicotine patch currently.  - We will determine need for pulmonary vasodilators after heart cath.  -  Wean O2 for SpO2 92% or higher, may need to use supplemental O2 during the day now - Continue supplemental O2 at night  PCCM will follow peripherally until heart catheterization is completed.   Best Practice (right click and "Reselect all SmartList Selections" daily)   Per  primary  Labs   CBC: Recent Labs  Lab 11/12/21 1838 11/13/21 0347 11/14/21 0357  WBC 3.6* 1.8* 5.4  NEUTROABS 1.8 1.7  --   HGB 15.6 14.6 14.0  HCT 47.1 44.5 43.0  MCV 94.6 94.7 95.3  PLT 110* 101* 107*    Basic Metabolic Panel: Recent Labs  Lab 11/12/21 1838 11/13/21 0347 11/14/21 0357  NA 138 139 138  K 3.9 4.4 4.5  CL 96* 99 99  CO2 37* 31 32  GLUCOSE 133* 131* 103*  BUN 25* 26* 34*  CREATININE 1.83* 1.56* 2.46*  CALCIUM 9.0 8.6* 8.6*  MG  --  1.7 1.7   GFR: Estimated Creatinine Clearance: 28.3 mL/min (A) (by C-G formula based on SCr of 2.46 mg/dL (H)). Recent Labs  Lab 11/12/21 1838 11/13/21 0347 11/14/21 0357  WBC 3.6* 1.8* 5.4    Liver Function Tests: No results for input(s): AST, ALT, ALKPHOS, BILITOT, PROT, ALBUMIN in the last 168 hours. No results for input(s): LIPASE, AMYLASE in the last 168 hours. No results for input(s): AMMONIA in the last 168 hours.  ABG    Component Value Date/Time   HCO3 45.8 (H) 09/29/2019 1627   TCO2 49 (H) 09/29/2019 1627   O2SAT 95.0 09/29/2019 1627     Coagulation Profile: No results for input(s): INR, PROTIME in the last 168 hours.  Cardiac Enzymes: No results for input(s): CKTOTAL, CKMB, CKMBINDEX, TROPONINI in the last 168 hours.  HbA1C: Hgb A1c MFr Bld  Date/Time Value Ref Range Status  09/28/2019 11:04 AM 5.2 4.8 - 5.6 % Final    Comment:    (NOTE) Pre diabetes:          5.7%-6.4% Diabetes:              >6.4% Glycemic control for   <7.0% adults with diabetes     CBG: No results for input(s): GLUCAP in the last 168 hours.  Review of Systems:   Review of Systems  Constitutional:  Negative for chills, fever, malaise/fatigue and weight loss.  HENT:  Negative for congestion, sinus pain and sore throat.   Eyes: Negative.   Respiratory:  Positive for shortness of breath. Negative for cough, hemoptysis, sputum production and wheezing.   Cardiovascular:  Positive for orthopnea and leg swelling.  Negative for chest pain, palpitations and claudication.  Gastrointestinal:  Negative for abdominal pain, heartburn, nausea and vomiting.  Genitourinary: Negative.   Musculoskeletal:  Negative for joint pain and myalgias.  Skin:  Negative for rash.  Neurological:  Negative for weakness.  Endo/Heme/Allergies: Negative.   Psychiatric/Behavioral: Negative.      Past Medical History:  He,  has a past medical history of Alcohol dependence ([place]), Chronic diastolic CHF (congestive heart failure) ([place]), COPD (chronic obstructive pulmonary disease) ([place]), Hypertension, Paroxysmal atrial flutter ([place]), Pulmonary HTN ([place]), and Tobacco dependence.   Surgical History:   Past Surgical History:  Procedure Laterality Date   RIGHT/LEFT HEART CATH AND CORONARY ANGIOGRAPHY Bilateral 09/29/2019   Procedure: RIGHT/LEFT HEART CATH AND CORONARY ANGIOGRAPHY;  Surgeon: Troy Sine, MD;  Location: [facility];  Service: Cardiovascular;  Laterality: Bilateral;   ulcer surgery       Social History:   reports  that he has been smoking cigarettes. He has a 49.00 pack-year smoking history. He has never used smokeless tobacco. He reports that he does not currently use alcohol. He reports that he does not currently use drugs.   Family History:  His family history includes CAD in his father and mother; Diabetes in his mother and sister; Heart failure in his mother.   Allergies No Active Allergies   Home Medications  Prior to Admission medications   Medication Sig Start Date End Date Taking? Authorizing Provider  apixaban (ELIQUIS) 5 MG TABS tablet Take 1 tablet (5 mg total) by mouth 2 (two) times daily. 06/14/20  Yes Turner, Eber Hong, MD  atorvastatin (LIPITOR) 20 MG tablet Take 1 tablet (20 mg total) by mouth daily at 6 PM. Patient taking differently: Take 20 mg by mouth daily. 11/10/19  Yes Sueanne Margarita, MD  fluticasone-salmeterol (ADVAIR HFA) 115-21 MCG/ACT inhaler Inhale 2 puffs into the lungs 2 (two)  times daily as needed (wheezing, shortness of breath).   Yes [provider]  folic acid (FOLVITE) 1 MG tablet Take 1 tablet (1 mg total) by mouth daily. 10/04/19  Yes Hongalgi, Lenis Dickinson, MD  furosemide (LASIX) 20 MG tablet Take 1 tablet (20 mg total) by mouth daily. 11/10/19  Yes Turner, Eber Hong, MD  metoprolol succinate (TOPROL-XL) 100 MG 24 hr tablet Take 100 mg by mouth daily. Take with or immediately following a meal.   Yes [provider]  Multiple Vitamin (MULTIVITAMIN WITH MINERALS) TABS tablet Take 1 tablet by mouth daily. 10/04/19  Yes Hongalgi, Lenis Dickinson, MD  thiamine 100 MG tablet Take 1 tablet (100 mg total) by mouth daily. 10/04/19  Yes Hongalgi, Lenis Dickinson, MD  albuterol (VENTOLIN HFA) 108 (90 Base) MCG/ACT inhaler Inhale 2 puffs into the lungs every 6 (six) hours as needed for wheezing or shortness of breath. Patient not taking: Reported on 11/13/2021 10/03/19   Modena Jansky, MD  metoprolol succinate (TOPROL XL) 50 MG 24 hr tablet Take 1.5 tablets (75 mg total) by mouth daily. Take with or immediately following a meal. Patient not taking: Reported on 11/13/2021 11/08/19   Sueanne Margarita, MD  nicotine (NICODERM CQ - DOSED IN MG/24 HOURS) 14 mg/24hr patch Place 1 patch (14 mg total) onto the skin daily. Patient not taking: Reported on 11/13/2021 10/04/19   Modena Jansky, MD  Tiotropium Bromide Monohydrate (SPIRIVA RESPIMAT) 2.5 MCG/ACT AERS Inhale 2 puffs into the lungs daily. Patient not taking: Reported on 11/13/2021 08/23/20   Freddi Starr, MD     Critical care time: n/a    Freda Jackson, MD [place] Office: [phone]   See Amion for personal pager PCCM on call pager [phone] until 7pm. Please call Elink 7p-7a. [phone]

## 2021-11-14 NOTE — Progress Notes (Signed)
Bilateral lower extremity venous duplex has been completed. Preliminary results can be found in CV Proc through chart review.   11/14/21 9:30 AM Olen Cordial RVT

## 2021-11-14 NOTE — Progress Notes (Signed)
ANTICOAGULATION CONSULT NOTE - Initial Consult  Pharmacy Consult for Heparin Indication: hx of atrial fibrillation (holding Eliquis)  No Active Allergies  Patient Measurements: Height: 5' 8.5" (174 cm) Weight: 75.5 kg (166 lb 7.2 oz) IBW/kg (Calculated) : 69.55 Heparin Dosing Weight: 75 kg  Vital Signs: Temp: 97.9 F (36.6 C) (05/24 1155) Temp Source: Oral (05/24 1155) BP: 90/72 (05/24 1155) Pulse Rate: 73 (05/24 1155)  Labs: Recent Labs    11/12/21 1838 11/13/21 0347 11/14/21 0357  HGB 15.6 14.6 14.0  HCT 47.1 44.5 43.0  PLT 110* 101* 107*  CREATININE 1.83* 1.56* 2.46*    Estimated Creatinine Clearance: 28.3 mL/min (A) (by C-G formula based on SCr of 2.46 mg/dL (H)).   Medical History: Past Medical History:  Diagnosis Date   Alcohol dependence ([place])    Chronic diastolic CHF (congestive heart failure) (HCC)    normal coronary arteries on cath   COPD (chronic obstructive pulmonary disease) (HCC)    Hypertension    Paroxysmal atrial flutter (H[facility])    Pulmonary HTN (H[facility])    secondary to COPD   Tobacco dependence     Medications:  Medications Prior to Admission  Medication Sig Dispense Refill Last Dose   apixaban (ELIQUIS) 5 MG TABS tablet Take 1 tablet (5 mg total) by mouth 2 (two) times daily. 180 tablet 1 11/11/2021 at 22:00   atorvastatin (LIPITOR) 20 MG tablet Take 1 tablet (20 mg total) by mouth daily at 6 PM. (Patient taking differently: Take 20 mg by mouth daily.) 90 tablet 3 Past Week   fluticasone-salmeterol (ADVAIR HFA) 115-21 MCG/ACT inhaler Inhale 2 puffs into the lungs 2 (two) times daily as needed (wheezing, shortness of breath).   Past Month   folic acid (FOLVITE) 1 MG tablet Take 1 tablet (1 mg total) by mouth daily. 30 tablet 0 Past Week   furosemide (LASIX) 20 MG tablet Take 1 tablet (20 mg total) by mouth daily. 90 tablet 3 Past Week   metoprolol succinate (TOPROL-XL) 100 MG 24 hr tablet Take 100 mg by mouth daily. Take with or immediately  following a meal.   11/11/2021 at 08:00   Multiple Vitamin (MULTIVITAMIN WITH MINERALS) TABS tablet Take 1 tablet by mouth daily.   Past Week   thiamine 100 MG tablet Take 1 tablet (100 mg total) by mouth daily. 30 tablet 0 Past Week   albuterol (VENTOLIN HFA) 108 (90 Base) MCG/ACT inhaler Inhale 2 puffs into the lungs every 6 (six) hours as needed for wheezing or shortness of breath. (Patient not taking: Reported on 11/13/2021) 18 g 0 Not Taking   metoprolol succinate (TOPROL XL) 50 MG 24 hr tablet Take 1.5 tablets (75 mg total) by mouth daily. Take with or immediately following a meal. (Patient not taking: Reported on 11/13/2021) 135 tablet 3 Not Taking   nicotine (NICODERM CQ - DOSED IN MG/24 HOURS) 14 mg/24hr patch Place 1 patch (14 mg total) onto the skin daily. (Patient not taking: Reported on 11/13/2021) 28 patch 0 Not Taking   Tiotropium Bromide Monohydrate (SPIRIVA RESPIMAT) 2.5 MCG/ACT AERS Inhale 2 puffs into the lungs daily. (Patient not taking: Reported on 11/13/2021) 4 g 6 Not Taking   Scheduled:   atorvastatin  20 mg Oral q1800   metoprolol succinate  75 mg Oral Daily   nicotine  14 mg Transdermal Daily   sodium chloride flush  3 mL Intravenous Q12H   umeclidinium-vilanterol  1 puff Inhalation Daily    Assessment: Pt is a 43 YOM with  PMH COPD, chronic diastolic CHF, paroxysmal atrial fibrillation on Eliquis, CKD stage III who presented on 5/22 with SOB x 3 weeks. Normal EF on echo in 2021>40 to 45% on 5/23. Pt does have a hx of noncompliance, stopped taking all medications, resumed Eliquis a few months ago. Pt's Eliquis was resumed on admission. Pharmacy has been consulted on 5/24 to transition him to heparin in anticipation of left and right heart cath on 5/26.  Today 11/14/21 -Plts low but stable, Hgb WNL -Scr up 2.46, CrCl 28 -Lower extremity doppler done, no evidence of DVT -Last dose of Eliquis 5/24 at 1014  Goal of Therapy:  Heparin level 0.3-0.7 units/ml aPTT: 66-102  seconds Monitor platelets by anticoagulation protocol: Yes   Plan:  -Check baseline aPTT and Heparin level at 2000 -Start IV heparin at rate of 1100 units/hr at 2200 -Check heparin level and aPTT 8 hours after start of infusion -Monitor for signs and symptoms of bleeding  Rodolph Bong, PharmD Candidate 11/14/2021 1:20 PM

## 2021-11-14 NOTE — Progress Notes (Signed)
Elevated PROGRESS NOTE    Aaron Fuller  [id number] DOB: 04/03/1956 DOA: 11/12/2021 PCP: Wynn Banker, PA   Brief Narrative:  66 year old with history of COPD, chronic hypoxia/hypercarbic respiratory failure, diastolic CHF, paroxysmal atrial flutter on Eliquis, tobacco dependence, alcohol use in remission, CKD stage III, hep C came to the ED with complaints of shortness of breath.  Unfortunately patient's multiple siblings passed away over the last few months therefore he quit taking all of his medications and was drinking excessively but stopped about 2 months ago.  He did resume his metoprolol and Eliquis but has not taken his Lasix in several months.  In the ED he was noted to have signs of volume overload with lower extremity swelling, orthopnea, hypoxia, elevated BNP and chest x-ray suggestive of slight volume overload.  Patient was started on IV Lasix 40 mg twice daily, his repeat echo showed EF 40%, grade 2 DD, PASP and moderately reduced RV function.  Due to rising creatinine, his Lasix was discontinued.  Due to abnormal echocardiogram, cardiology team was consulted.   Assessment & Plan:  Principal Problem:   Acute on chronic diastolic CHF (congestive heart failure) (HCC) Active Problems:   Paroxysmal atrial flutter (HCC)   Emphysema of lung (HCC)   CKD (chronic kidney disease) stage 3, GFR 30-59 ml/min (HCC)   Chronic respiratory failure with hypoxia and hypercapnia (HCC)   Thrombocytopenia (HCC)   COPD exacerbation (HCC)    Acute congestive heart failure with preserved ejection fraction, class III.  EF 60%, grade 1 DD Pulmonary hypertension, likely from COPD -He has signs of CHF exacerbation including orthopnea, hypoxia and lower extremity swelling.  BNP elevated.  Echocardiogram in 2021 showed EF 60%, grade 1 DD, elevated PASP and mildly reduced RV function.  Repeat echocardiogram shows EF 40%, grade 2 DD, elevated PASP and moderately reduced RV function. Likely RHC.  -  We will hold his Lasix today due to rising creatinine. - Cardiology - Will need RHC - Monitor and replete electrolytes as necessary. -Follows outpatient pulmonary and cardiology. LE dopplers- neg for dvt.    AKI on Chronic kidney disease stage IIIa -Baseline creatinine 1.4, admission creatinine 1.83.  Creatinine today is 2.46.  Likely from medications, hold diuretics today.  Bladder scan.  If renal function fails to improve over next 24-48 hours we will consider getting renal ultrasound.   COPD; chronic hypoxic and hypercarbic respiratory failure  -On home 3 L nasal cannula. Bronchodilators per Pulm.   Paroxysmal atrial flutter -Continue Toprol-XL, Eliquis.  On IV Lopressor as needed.   Thrombocytopenia and leukopenia Alcohol dependence -Suspect from chronic alcohol use and hepatitis C.  No evidence of bleeding and active infection.  We will continue to monitor this -States his last alcoholic drink was over 2 months ago.   History of hepatitis C Patient was seen in GI clinic back in August 2022 but never followed up for treatment.  Would recommend following up with GI/infectious disease outpatient again   DVT prophylaxis: Eliquis Code Status: Full code Family Communication:    Maintain hospital stay for cardiac evaluation, pulmonary following.  He will likely need right heart catheterization in next couple of days.  Monitor his renal function.  Patient is not stable for discharge   Subjective: Seen and examined at bedside.  Denies any shortness of breath, tells me it has improved after getting Lasix yesterday.  At first he is hesitant to get right heart catheterization but is agreeable now.  Examination: Constitutional: Not in acute  distress.  3 L nasal cannula Respiratory: Clear to auscultation bilaterally Cardiovascular: Normal sinus rhythm, no rubs Abdomen: Nontender nondistended good bowel sounds Musculoskeletal: No edema noted Skin: No rashes seen Neurologic: CN 2-12  grossly intact.  And nonfocal Psychiatric: Normal judgment and insight. Alert and oriented x 3. Normal mood.  Objective: Vitals:   11/13/21 1428 11/13/21 2044 11/14/21 0434 11/14/21 0500  BP: 112/82 107/81 95/70   Pulse: 84 84 76   Resp: 18 20 20    Temp: 97.8 F (36.6 C) 98 F (36.7 C) 98.1 F (36.7 C)   TempSrc: Oral Oral Oral   SpO2: 98% 100% 92%   Weight:    75.5 kg  Height:        Intake/Output Summary (Last 24 hours) at 11/14/2021 0740 Last data filed at 11/13/2021 2130 Gross per 24 hour  Intake 1227 ml  Output 575 ml  Net 652 ml   Filed Weights   11/12/21 1822 11/13/21 0500 11/14/21 0500  Weight: 74.8 kg 73.7 kg 75.5 kg     Data Reviewed:   CBC: Recent Labs  Lab 11/12/21 1838 11/13/21 0347 11/14/21 0357  WBC 3.6* 1.8* 5.4  NEUTROABS 1.8 1.7  --   HGB 15.6 14.6 14.0  HCT 47.1 44.5 43.0  MCV 94.6 94.7 95.3  PLT 110* 101* [id number]*   Basic Metabolic Panel: Recent Labs  Lab 11/12/21 1838 11/13/21 0347 11/14/21 0357  NA 138 139 138  K 3.9 4.4 4.5  CL 96* 99 99  CO2 37* 31 32  GLUCOSE 133* 131* 103*  BUN 25* 26* 34*  CREATININE 1.83* 1.56* 2.46*  CALCIUM 9.0 8.6* 8.6*  MG  --  1.7 1.7   GFR: Estimated Creatinine Clearance: 28.3 mL/min (A) (by C-G formula based on SCr of 2.46 mg/dL (H)). Liver Function Tests: No results for input(s): AST, ALT, ALKPHOS, BILITOT, PROT, ALBUMIN in the last 168 hours. No results for input(s): LIPASE, AMYLASE in the last 168 hours. No results for input(s): AMMONIA in the last 168 hours. Coagulation Profile: No results for input(s): INR, PROTIME in the last 168 hours. Cardiac Enzymes: No results for input(s): CKTOTAL, CKMB, CKMBINDEX, TROPONINI in the last 168 hours. BNP (last 3 results) No results for input(s): PROBNP in the last 8760 hours. HbA1C: No results for input(s): HGBA1C in the last 72 hours. CBG: No results for input(s): GLUCAP in the last 168 hours. Lipid Profile: No results for input(s): CHOL, HDL,  LDLCALC, TRIG, CHOLHDL, LDLDIRECT in the last 72 hours. Thyroid Function Tests: No results for input(s): TSH, T4TOTAL, FREET4, T3FREE, THYROIDAB in the last 72 hours. Anemia Panel: No results for input(s): VITAMINB12, FOLATE, FERRITIN, TIBC, IRON, RETICCTPCT in the last 72 hours. Sepsis Labs: No results for input(s): PROCALCITON, LATICACIDVEN in the last 168 hours.  Recent Results (from the past 240 hour(s))  Resp Panel by RT-PCR (Flu A&B, Covid) Nasopharyngeal Swab     Status: None   Collection Time: 11/12/21  6:38 PM   Specimen: Nasopharyngeal Swab; Nasopharyngeal(NP) swabs in vial transport medium  Result Value Ref Range Status   SARS Coronavirus 2 by RT PCR NEGATIVE NEGATIVE Final    Comment: (NOTE) SARS-CoV-2 target nucleic acids are NOT DETECTED.  The SARS-CoV-2 RNA is generally detectable in upper respiratory specimens during the acute phase of infection. The lowest concentration of SARS-CoV-2 viral copies this assay can detect is 138 copies/mL. A negative result does not preclude SARS-Cov-2 infection and should not be used as the sole basis for treatment or other  patient management decisions. A negative result may occur with  improper specimen collection/handling, submission of specimen other than nasopharyngeal swab, presence of viral mutation(s) within the areas targeted by this assay, and inadequate number of viral copies(<138 copies/mL). A negative result must be combined with clinical observations, patient history, and epidemiological information. The expected result is Negative.  Fact Sheet for Patients:  EntrepreneurPulse.com.au  Fact Sheet for Healthcare Providers:  IncredibleEmployment.be  This test is no t yet approved or cleared by the [place] FDA and  has been authorized for detection and/or diagnosis of SARS-CoV-2 by FDA under an Emergency Use Authorization (EUA). This EUA will remain  in effect (meaning this test  can be used) for the duration of the COVID-19 declaration under Section 564(b)(1) of the Act, 21 U.S.C.section 360bbb-3(b)(1), unless the authorization is terminated  or revoked sooner.       Influenza A by PCR NEGATIVE NEGATIVE Final   Influenza B by PCR NEGATIVE NEGATIVE Final    Comment: (NOTE) The Xpert Xpress SARS-CoV-2/FLU/RSV plus assay is intended as an aid in the diagnosis of influenza from Nasopharyngeal swab specimens and should not be used as a sole basis for treatment. Nasal washings and aspirates are unacceptable for Xpert Xpress SARS-CoV-2/FLU/RSV testing.  Fact Sheet for Patients: EntrepreneurPulse.com.au  Fact Sheet for Healthcare Providers: IncredibleEmployment.be  This test is not yet approved or cleared by the [place] FDA and has been authorized for detection and/or diagnosis of SARS-CoV-2 by FDA under an Emergency Use Authorization (EUA). This EUA will remain in effect (meaning this test can be used) for the duration of the COVID-19 declaration under Section 564(b)(1) of the Act, 21 U.S.C. section 360bbb-3(b)(1), unless the authorization is terminated or revoked.  Performed at [facility], [address], [place],  [address]          Radiology Studies: DG Chest [place] 1 View  Result Date: 11/12/2021 CLINICAL DATA:  Shortness of breath EXAM: PORTABLE CHEST 1 VIEW COMPARISON:  10/01/2019 FINDINGS: The heart is slightly enlarged. No focal opacity, pleural effusion, or pneumothorax. Emphysema. IMPRESSION: 1. Hyperinflation with emphysema 2. Mild cardiomegaly. Electronically Signed   By: Donavan Foil M.D.   On: 11/12/2021 19:17   ECHOCARDIOGRAM COMPLETE  Result Date: 11/13/2021    ECHOCARDIOGRAM REPORT   Patient Name:   HARNOOR BROSCH Date of Exam: 11/13/2021 Medical Rec #:  [id number]  Height:       68.5 in Accession #:    [id number] Weight:       162.5 lb Date of Birth:  12-09-1955  BSA:          1.881  m Patient Age:    42 years   BP:           128/89 mmHg Patient Gender: M          HR:           89 bpm. Exam Location:  Inpatient Procedure: 2D Echo, Cardiac Doppler and Color Doppler Indications:    Cardiomyopathy  History:        Patient has prior history of Echocardiogram examinations, most                 recent 09/28/2019. CHF, COPD; Arrythmias:Tachycardia.  Sonographer:    Jefferey Pica Referring Phys: [id number] Aryona Sill CHIRAG Evgenia Merriman IMPRESSIONS  1. Left ventricular ejection fraction, by estimation, is 40 to 45%. The left ventricle has mildly decreased function. The left ventricle demonstrates global hypokinesis. There is severe concentric left  ventricular hypertrophy. Left ventricular diastolic  parameters are consistent with Grade II diastolic dysfunction (pseudonormalization). There is the interventricular septum is flattened in diastole ('D' shaped left ventricle), consistent with right ventricular volume overload.  2. Right ventricular systolic function is moderately reduced. The right ventricular size is moderately enlarged. Mildly increased right ventricular wall thickness. There is moderately elevated pulmonary artery systolic pressure. The estimated right ventricular systolic pressure is [id number] mmHg.  3. Left atrial size was mildly dilated.  4. Right atrial size was severely dilated.  5. A small pericardial effusion is present. No evidence of tamponade.  6. The mitral valve is grossly normal. Trivial mitral valve regurgitation.  7. Tricuspid valve regurgitation is severe.  8. The aortic valve is tricuspid. Aortic valve regurgitation is mild to moderate.  9. Pulmonic valve regurgitation is moderate. 10. Moderately dilated pulmonary artery. 11. The inferior vena cava is dilated in size with <50% respiratory variability, suggesting right atrial pressure of 15 mmHg. Comparison(s): Significant changes: worsening LVEF with torrential, functional tricuspid regurgitation. Conclusion(s)/Recommendation(s): Notable  hypertrophy, atrial dilation, and effusion: amyloid nuclear imaging may be indicated as outpatient. FINDINGS  Left Ventricle: Left ventricular ejection fraction, by estimation, is 40 to 45%. The left ventricle has mildly decreased function. The left ventricle demonstrates global hypokinesis. The left ventricular internal cavity size was small. There is severe concentric left ventricular hypertrophy. The interventricular septum is flattened in diastole ('D' shaped left ventricle), consistent with right ventricular volume overload. Left ventricular diastolic parameters are consistent with Grade II diastolic dysfunction (pseudonormalization). Right Ventricle: The right ventricular size is moderately enlarged. Mildly increased right ventricular wall thickness. Right ventricular systolic function is moderately reduced. There is moderately elevated pulmonary artery systolic pressure. The tricuspid regurgitant velocity is 3.03 m/s, and with an assumed right atrial pressure of 15 mmHg, the estimated right ventricular systolic pressure is [id number] mmHg. Left Atrium: Left atrial size was mildly dilated. Right Atrium: Right atrial size was severely dilated. Pericardium: A small pericardial effusion is present. Mitral Valve: The mitral valve is grossly normal. Trivial mitral valve regurgitation. Tricuspid Valve: The tricuspid valve is normal in structure. Tricuspid valve regurgitation is severe. Aortic Valve: The aortic valve is tricuspid. Aortic valve regurgitation is mild to moderate. Aortic regurgitation PHT measures 667 msec. Aortic valve peak gradient measures 8.1 mmHg. Pulmonic Valve: The pulmonic valve was normal in structure. Pulmonic valve regurgitation is moderate. No evidence of pulmonic stenosis. Aorta: The aortic root and ascending aorta are structurally normal, with no evidence of dilitation. Pulmonary Artery: The pulmonary artery is moderately dilated. Venous: The inferior vena cava is dilated in size with less than  50% respiratory variability, suggesting right atrial pressure of 15 mmHg. IAS/Shunts: There is right bowing of the interatrial septum, suggestive of elevated left atrial pressure. No atrial level shunt detected by color flow Doppler.  LEFT VENTRICLE PLAX 2D LVIDd:         3.15 cm      Diastology LVIDs:         2.60 cm      LV e' medial:    7.54 cm/s LV PW:         2.05 cm      LV E/e' medial:  4.5 LV IVS:        1.82 cm      LV e' lateral:   7.51 cm/s LVOT diam:     1.80 cm      LV E/e' lateral: 4.5 LV SV:  55 LV SV Index:   29 LVOT Area:     2.54 cm  LV Volumes (MOD) LV vol d, MOD A4C: 119.0 ml LV vol s, MOD A4C: 84.2 ml LV SV MOD A4C:     119.0 ml RIGHT VENTRICLE          IVC RV Basal diam:  4.40 cm  IVC diam: 3.10 cm RV Mid diam:    5.00 cm TAPSE (M-mode): 1.6 cm LEFT ATRIUM             Index        RIGHT ATRIUM           Index LA diam:        4.40 cm 2.34 cm/m   RA Area:     30.60 cm LA Vol (A2C):   69.3 ml 36.84 ml/m  RA Volume:   131.00 ml 69.64 ml/m LA Vol (A4C):   47.7 ml 25.36 ml/m LA Biplane Vol: 60.0 ml 31.89 ml/m  AORTIC VALVE                 PULMONIC VALVE AV Area (Vmax): 2.51 cm     PV Vmax:       0.90 m/s AV Vmax:        142.00 cm/s  PV Peak grad:  3.2 mmHg AV Peak Grad:   8.1 mmHg LVOT Vmax:      140.00 cm/s LVOT Vmean:     79.400 cm/s LVOT VTI:       0.218 m AI PHT:         667 msec  AORTA Ao Asc diam: 3.50 cm MITRAL VALVE               TRICUSPID VALVE MV Area (PHT): 5.16 cm    TR Peak grad:   36.7 mmHg MV Decel Time: 147 msec    TR Vmax:        303.00 cm/s MV E velocity: 33.80 cm/s MV A velocity: 75.00 cm/s  SHUNTS MV E/A ratio:  0.45        Systemic VTI:  0.22 m                            Systemic Diam: 1.80 cm Rudean Haskell MD Electronically signed by Rudean Haskell MD Signature Date/Time: 11/13/2021/12:40:01 PM    Final         Scheduled Meds:  apixaban  5 mg Oral BID   arformoterol  15 mcg Nebulization BID   atorvastatin  20 mg Oral q1800   metoprolol  succinate  75 mg Oral Daily   nicotine  14 mg Transdermal Daily   sodium chloride flush  3 mL Intravenous Q12H   umeclidinium bromide  1 puff Inhalation Daily   Continuous Infusions:   LOS: 1 day   Time spent= 35 mins    Simra Fiebig Arsenio Loader, MD [facility]  If 7PM-7AM, please contact night-coverage  11/14/2021, 7:40 AM

## 2021-11-14 NOTE — TOC Progression Note (Signed)
Transition of Care [facility]) - Progression Note    Patient Details  Name: Aaron Fuller MRN: [id number] Date of Birth: 27-Oct-1955  Transition [facility] CM/SW Contact  Golda Acre, RN Phone Number: 11/14/2021, 9:22 AM  Clinical Narrative:    [facility] is unable to do home heart failkure screening.  No one else that takes insurance has program.   Expected Discharge Plan: Home w Home Health Services Barriers to Discharge: Continued Medical Work up  Expected Discharge Plan and Services Expected Discharge Plan: Home w Home Health Services   Discharge Planning Services: CM Consult Post Acute Care Choice: Home Health Living arrangements for the past 2 months: Single Family Home                           HH Arranged: Disease Management HH Agency: [facility] Date [facility] Agency Contacted: 11/13/21 Time HH Agency Contacted: [phone] Representative spoke with at [facility] Agency: [email or web address]   Social Determinants of Health (SDOH) Interventions    Readmission Risk Interventions     View : No data to display.

## 2021-11-15 ENCOUNTER — Inpatient Hospital Stay (HOSPITAL_COMMUNITY): Payer: Medicare Other

## 2021-11-15 DIAGNOSIS — I5033 Acute on chronic diastolic (congestive) heart failure: Secondary | ICD-10-CM | POA: Diagnosis not present

## 2021-11-15 LAB — CBC
HCT: 41.3 % (ref 39.0–52.0)
Hemoglobin: 13.9 g/dL (ref 13.0–17.0)
MCH: 31.4 pg (ref 26.0–34.0)
MCHC: 33.7 g/dL (ref 30.0–36.0)
MCV: 93.4 fL (ref 80.0–100.0)
Platelets: 110 10*3/uL — ABNORMAL LOW (ref 150–400)
RBC: 4.42 MIL/uL (ref 4.22–5.81)
RDW: 17.1 % — ABNORMAL HIGH (ref 11.5–15.5)
WBC: 5.1 10*3/uL (ref 4.0–10.5)
nRBC: 0 % (ref 0.0–0.2)

## 2021-11-15 LAB — BASIC METABOLIC PANEL
Anion gap: 8 (ref 5–15)
BUN: 40 mg/dL — ABNORMAL HIGH (ref 8–23)
CO2: 32 mmol/L (ref 22–32)
Calcium: 8.9 mg/dL (ref 8.9–10.3)
Chloride: 97 mmol/L — ABNORMAL LOW (ref 98–111)
Creatinine, Ser: 2.61 mg/dL — ABNORMAL HIGH (ref 0.61–1.24)
GFR, Estimated: 26 mL/min — ABNORMAL LOW (ref >60)
Glucose, Bld: 91 mg/dL (ref 70–99)
Potassium: 4.5 mmol/L (ref 3.5–5.1)
Sodium: 137 mmol/L (ref 135–145)

## 2021-11-15 LAB — URINALYSIS, ROUTINE W REFLEX MICROSCOPIC
Bilirubin Urine: NEGATIVE
Glucose, UA: NEGATIVE mg/dL
Ketones, ur: 5 mg/dL — AB
Nitrite: NEGATIVE
Protein, ur: 100 mg/dL — AB
RBC / HPF: >50 RBC/hpf — ABNORMAL HIGH (ref 0–5)
Specific Gravity, Urine: 1.017 (ref 1.005–1.030)
WBC, UA: >50 WBC/hpf — ABNORMAL HIGH (ref 0–5)
pH: 5 (ref 5.0–8.0)

## 2021-11-15 LAB — APTT
aPTT: 114 seconds — ABNORMAL HIGH (ref 24–36)
aPTT: 68 seconds — ABNORMAL HIGH (ref 24–36)

## 2021-11-15 LAB — HEPARIN LEVEL (UNFRACTIONATED): Heparin Unfractionated: >1.1 IU/mL — ABNORMAL HIGH (ref 0.30–0.70)

## 2021-11-15 LAB — MAGNESIUM: Magnesium: 1.6 mg/dL — ABNORMAL LOW (ref 1.7–2.4)

## 2021-11-15 MED ORDER — METOPROLOL SUCCINATE ER 25 MG PO TB24
25.0000 mg | ORAL_TABLET | Freq: Every day | ORAL | Status: DC
  Administered 2021-11-16 – 2021-11-18 (×3): 25 mg via ORAL
  Filled 2021-11-15 (×3): qty 1

## 2021-11-15 MED ORDER — TRAZODONE HCL 50 MG PO TABS: 25.0000 mg | ORAL_TABLET | Freq: Every evening | ORAL | Status: DC | PRN

## 2021-11-15 MED ORDER — HEPARIN (PORCINE) 25000 UT/250ML-% IV SOLN
950.0000 [IU]/h | INTRAVENOUS | Status: DC
  Administered 2021-11-15: 950 [IU]/h via INTRAVENOUS
  Filled 2021-11-15 (×2): qty 250

## 2021-11-15 NOTE — H&P (Addendum)
Right heart cath to stage level of pulmonary hypertension.  Plan antecubital approach.

## 2021-11-15 NOTE — Progress Notes (Signed)
   11/15/21 0839  Assess: MEWS Score  Temp 97.8 F (36.6 C)  BP 100/75  Pulse Rate 72  Resp (!) 22  Level of Consciousness Alert  SpO2 96 %  O2 Device Nasal Cannula  Assess: MEWS Score  MEWS Temp 0  MEWS Systolic 1  MEWS Pulse 0  MEWS RR 1  MEWS LOC 0  MEWS Score 2  MEWS Score Color Yellow  Assess: if the MEWS score is Yellow or Red  Were vital signs taken at a resting state? Yes  Focused Assessment No change from prior assessment  Does the patient meet 2 or more of the SIRS criteria? No  Does the patient have a confirmed or suspected source of infection? No  MEWS guidelines implemented *See Row Information* Yes  Treat  MEWS Interventions Other (Comment) (follow MEWS protocol)  Pain Scale 0-10  Pain Score 0  Take Vital Signs  Increase Vital Sign Frequency  Yellow: Q 2hr X 2 then Q 4hr X 2, if remains yellow, continue Q 4hrs  Escalate  MEWS: Escalate Yellow: discuss with charge nurse/RN and consider discussing with provider and RRT  Notify: Charge Nurse/RN  Name of Charge Nurse/RN Notified Dagoberto Ligas RN  Date Charge Nurse/RN Notified 11/15/21  Time Charge Nurse/RN Notified 0350  Notify: Provider  Provider Name/Title Azalee Course PA  Date Provider Notified 11/15/21  Time Provider Notified [phone]  Method of Notification Call  Notification Reason Other (Comment) (yellow MEWS)  Provider response In department;No new orders  Date of Provider Response 11/15/21  Time of Provider Response [phone]  Assess: SIRS CRITERIA  SIRS Temperature  0  SIRS Pulse 0  SIRS Respirations  1  SIRS WBC 0  SIRS Score Sum  1   Patient is in bed, resting.  Denies pain at this time.  No distress.

## 2021-11-15 NOTE — Progress Notes (Signed)
[place] for Heparin Indication: hx of atrial fibrillation (holding Apixaban)  No Active Allergies  Patient Measurements: Height: 5' 8.5" (174 cm) Weight: 76.8 kg (169 lb 5 oz) IBW/kg (Calculated) : 69.55 Heparin Dosing Weight: total body weight   Vital Signs: Temp: 98 F (36.7 C) (05/25 1637) Temp Source: Oral (05/25 1637) BP: 110/75 (05/25 1637) Pulse Rate: 72 (05/25 1637)  Labs: Recent Labs    11/13/21 0347 11/14/21 0357 11/14/21 1949 11/15/21 0603 11/15/21 1700  HGB 14.6 14.0  --  13.9  --   HCT 44.5 43.0  --  41.3  --   PLT 101* 107*  --  110*  --   APTT  --   --  35 114* 68*  HEPARINUNFRC  --   --  >1.10* >1.10*  --   CREATININE 1.56* 2.46*  --  2.61*  --      Estimated Creatinine Clearance: 26.7 mL/min (A) (by C-G formula based on SCr of 2.61 mg/dL (H)).   Medical History: Past Medical History:  Diagnosis Date   Alcohol dependence ([place])    Chronic diastolic CHF (congestive heart failure) (HCC)    normal coronary arteries on cath   COPD (chronic obstructive pulmonary disease) (HCC)    Hypertension    Paroxysmal atrial flutter (HCC)    Pulmonary HTN (HCC)    secondary to COPD   Tobacco dependence     Assessment: 47 YOM with PMH of COPD, chronic diastolic CHF, paroxysmal atrial fibrillation on Apixaban, CKD stage III who presented on 5/22 with SOB x 3 weeks. Normal EF on echo in 2021, decreased to 40-45% on 5/23. Patient stopped taking all medications after multiple siblings passed away. He resumed Apixaban a few months ago. Apixaban was resumed on admission. Pharmacy has been consulted on 5/24 to transition him to heparin in anticipation of right heart cath. Last dose of Apixaban 5mg  PO BID on 5/24 at 1014.  -Lower extremity doppler negative for DVT  Today, 11/15/21: -Heparin level > 1.1, falsely elevated as expected due to recent apixaban use -aPTT 68 therapeutic -CBC: Hgb WNL, Pltc low but stable at 110K -SCr  up to 2.61, CrCl ~ 27 ml/min   -IV access lost ~ 1800 - heparin currently off  Goal of Therapy:  Heparin level 0.3-0.7 units/ml aPTT: 66-102 seconds Monitor platelets by anticoagulation protocol: Yes   Plan:  -Continue heparin infusion at 950 units/hr once IV access re-established -Will use aPTT for dose titration/monitoring until effects of apixaban on heparin level have diminished -Confirmatory aPTT with morning labs or approximately 8 hours after restart of heparin -Daily CBC, heparin level, aPTT -Monitor closely for signs and symptoms of bleeding -Noted plans to transfer to [facility] for right heart cath   Tawnya Crook, PharmD, BCPS Clinical Pharmacist 11/15/2021 6:27 PM

## 2021-11-15 NOTE — Consult Note (Signed)
[facility] Renal Consultation Note  Requesting MD: Nelson Chimes Indication for Consultation: A on CRF  HPI:  Aaron Fuller is a 66 y.o. male with past medical history significant for COPD, increased pulmonary artery pressure felt due to COPD-  continued tobacco abuse, Aflutter on eliquis, hepatitis C, history of ETOH use/abuse.  He presented to the ER on 5/22 with complaints consistent with volume overload.  Of note - he had taken a medication holiday and was drinking more ETOH in the setting of multiple family deaths of late. He is also noted to have a history of CKD-  crt in the mid 1's it seems-  1.43 on 07/06/20-  1.26 on 12/13/20.  At the time of presentation crt was 1.83- then went to 1.56 on 5/23-  but the last 2 days has trended up-  to 2.46 and today 2.61.  There have been low BPs recorded as low as 87/56-  an echo was done and found worsening EF as well as worsening pulmonary HTN-  cards is involved -  wanting to do right heart cath also severe TR.  Patient tells me he thinks that he feels better but was having orthopnea and PND prior to admit-  weights are not really indicative of diuresis-  urine output is not either-  renal US done today -  left kidney smaller but still 9 cm-   inc echogenicity-  no hydro  Creatinine, Ser  Date/Time Value Ref Range Status  11/15/2021 06:03 AM 2.61 (H) 0.61 - 1.24 mg/dL Final  49/35/5217 47:15 AM 2.46 (H) 0.61 - 1.24 mg/dL Final  95/39/6728 97:91 AM 1.56 (H) 0.61 - 1.24 mg/dL Final  50/41/3643 83:77 PM 1.83 (H) 0.61 - 1.24 mg/dL Final  93/96/8864 84:72 AM 1.43 (H) 0.76 - 1.27 mg/dL Final  01/12/8287 33:74 AM 1.43 (H) 0.76 - 1.27 mg/dL Final  45/14/6047 99:87 PM 1.26 0.76 - 1.27 mg/dL Final  21/58/7276 18:48 AM 1.50 (H) 0.61 - 1.24 mg/dL Final  59/27/6394 32:00 AM 1.90 (H) 0.61 - 1.24 mg/dL Final  37/94/4461 90:12 AM 2.79 (H) 0.61 - 1.24 mg/dL Final  22/41/1464 31:42 AM 1.95 (H) 0.61 - 1.24 mg/dL Final  76/70/1100 34:96 AM 1.39 (H) 0.61 - 1.24  mg/dL Final  11/64/3539 12:25 AM 1.36 (H) 0.61 - 1.24 mg/dL Final  83/46/2194 71:25 PM 1.64 (H) 0.61 - 1.24 mg/dL Final     PMHx:   Past Medical History:  Diagnosis Date   Alcohol dependence (HCC)    Chronic diastolic CHF (congestive heart failure) (HCC)    normal coronary arteries on cath   COPD (chronic obstructive pulmonary disease) (HCC)    Hypertension    Paroxysmal atrial flutter (HCC)    Pulmonary HTN (HCC)    secondary to COPD   Tobacco dependence     Past Surgical History:  Procedure Laterality Date   RIGHT/LEFT HEART CATH AND CORONARY ANGIOGRAPHY Bilateral 09/29/2019   Procedure: RIGHT/LEFT HEART CATH AND CORONARY ANGIOGRAPHY;  Surgeon: Lennette Bihari, MD;  Location: [facility];  Service: Cardiovascular;  Laterality: Bilateral;   ulcer surgery      Family Hx:  Family History  Problem Relation Age of Onset   CAD Mother    Diabetes Mother    Heart failure Mother    CAD Father    Diabetes Sister     Social History:  reports that he has been smoking cigarettes. He has a 49.00 pack-year smoking history. He has never used smokeless tobacco. He reports that  he does not currently use alcohol. He reports that he does not currently use drugs.  Allergies: No Active Allergies  Medications: Prior to Admission medications   Medication Sig Start Date End Date Taking? Authorizing Provider  apixaban (ELIQUIS) 5 MG TABS tablet Take 1 tablet (5 mg total) by mouth 2 (two) times daily. 06/14/20  Yes Turner, Eber Hong, MD  atorvastatin (LIPITOR) 20 MG tablet Take 1 tablet (20 mg total) by mouth daily at 6 PM. Patient taking differently: Take 20 mg by mouth daily. 11/10/19  Yes Sueanne Margarita, MD  fluticasone-salmeterol (ADVAIR HFA) 115-21 MCG/ACT inhaler Inhale 2 puffs into the lungs 2 (two) times daily as needed (wheezing, shortness of breath).   Yes [provider]  folic acid (FOLVITE) 1 MG tablet Take 1 tablet (1 mg total) by mouth daily. 10/04/19  Yes Hongalgi,  Lenis Dickinson, MD  furosemide (LASIX) 20 MG tablet Take 1 tablet (20 mg total) by mouth daily. 11/10/19  Yes Turner, Eber Hong, MD  metoprolol succinate (TOPROL-XL) 100 MG 24 hr tablet Take 100 mg by mouth daily. Take with or immediately following a meal.   Yes [provider]  Multiple Vitamin (MULTIVITAMIN WITH MINERALS) TABS tablet Take 1 tablet by mouth daily. 10/04/19  Yes Hongalgi, Lenis Dickinson, MD  thiamine 100 MG tablet Take 1 tablet (100 mg total) by mouth daily. 10/04/19  Yes Hongalgi, Lenis Dickinson, MD  albuterol (VENTOLIN HFA) 108 (90 Base) MCG/ACT inhaler Inhale 2 puffs into the lungs every 6 (six) hours as needed for wheezing or shortness of breath. Patient not taking: Reported on 11/13/2021 10/03/19   Modena Jansky, MD  metoprolol succinate (TOPROL XL) 50 MG 24 hr tablet Take 1.5 tablets (75 mg total) by mouth daily. Take with or immediately following a meal. Patient not taking: Reported on 11/13/2021 11/08/19   Sueanne Margarita, MD  nicotine (NICODERM CQ - DOSED IN MG/24 HOURS) 14 mg/24hr patch Place 1 patch (14 mg total) onto the skin daily. Patient not taking: Reported on 11/13/2021 10/04/19   Modena Jansky, MD  Tiotropium Bromide Monohydrate (SPIRIVA RESPIMAT) 2.5 MCG/ACT AERS Inhale 2 puffs into the lungs daily. Patient not taking: Reported on 11/13/2021 08/23/20   Freddi Starr, MD    I have reviewed the patient's current medications.  Labs:  Results for orders placed or performed during the hospital encounter of 11/12/21 (from the past 48 hour(s))  Basic metabolic panel     Status: Abnormal   Collection Time: 11/14/21  3:57 AM  Result Value Ref Range   Sodium 138 135 - 145 mmol/L   Potassium 4.5 3.5 - 5.1 mmol/L   Chloride 99 98 - 111 mmol/L   CO2 32 22 - 32 mmol/L   Glucose, Bld 103 (H) 70 - 99 mg/dL    Comment: Glucose reference range applies only to samples taken after fasting for at least 8 hours.   BUN 34 (H) 8 - 23 mg/dL   Creatinine, Ser 2.46 (H) 0.61 - 1.24 mg/dL    Calcium 8.6 (L) 8.9 - 10.3 mg/dL   GFR, Estimated 28 (L) >60 mL/min    Comment: (NOTE) Calculated using the CKD-EPI Creatinine Equation (2021)    Anion gap 7 5 - 15    Comment: Performed at [facility], [place] [address] [place], [place] [address]  CBC     Status: Abnormal   Collection Time: 11/14/21  3:57 AM  Result Value Ref Range   WBC 5.4 4.0 -  10.5 K/uL   RBC 4.51 4.22 - 5.81 MIL/uL   Hemoglobin 14.0 13.0 - 17.0 g/dL   HCT 43.0 39.0 - 52.0 %   MCV 95.3 80.0 - 100.0 fL   MCH 31.0 26.0 - 34.0 pg   MCHC 32.6 30.0 - 36.0 g/dL   RDW 17.1 (H) 11.5 - 15.5 %   Platelets 107 (L) 150 - 400 K/uL    Comment: SPECIMEN CHECKED FOR CLOTS Immature Platelet Fraction may be clinically indicated, consider ordering this additional test [id number] CONSISTENT WITH PREVIOUS RESULT REPEATED TO VERIFY    nRBC 0.4 (H) 0.0 - 0.2 %    Comment: Performed at [facility], [place] [address] [place], [place] [address]  Magnesium     Status: None   Collection Time: 11/14/21  3:57 AM  Result Value Ref Range   Magnesium 1.7 1.7 - 2.4 mg/dL    Comment: Performed at [facility], [place] [address] [place], [place] [address]  Heparin level (unfractionated)     Status: Abnormal   Collection Time: 11/14/21  7:49 PM  Result Value Ref Range   Heparin Unfractionated >1.10 (H) 0.30 - 0.70 IU/mL    Comment: (NOTE) The clinical reportable range upper limit is being lowered to >1.10 to align with the FDA approved guidance for the current laboratory assay.  If heparin results are below expected values, and patient dosage has  been confirmed, suggest follow up testing of antithrombin III levels. Performed at [facility], [place] [address], [place], [place] [address]   APTT     Status: None   Collection Time: 11/14/21  7:49 PM  Result Value Ref Range   aPTT 35 24 - 36 seconds    Comment: Performed at [facility], [place]  [address], [place], [place] [id number]  Basic metabolic panel     Status: Abnormal   Collection Time: 11/15/21  6:03 AM  Result Value Ref Range   Sodium 137 135 - 145 mmol/L   Potassium 4.5 3.5 - 5.1 mmol/L   Chloride 97 (L) 98 - 111 mmol/L   CO2 32 22 - 32 mmol/L   Glucose, Bld 91 70 - 99 mg/dL    Comment: Glucose reference range applies only to samples taken after fasting for at least 8 hours.   BUN 40 (H) 8 - 23 mg/dL   Creatinine, Ser 2.61 (H) 0.61 - 1.24 mg/dL   Calcium 8.9 8.9 - 10.3 mg/dL   GFR, Estimated 26 (L) >60 mL/min    Comment: (NOTE) Calculated using the CKD-EPI Creatinine Equation (2021)    Anion gap 8 5 - 15    Comment: Performed at [facility], [place] [address], [place], [place] [address]  CBC     Status: Abnormal   Collection Time: 11/15/21  6:03 AM  Result Value Ref Range   WBC 5.1 4.0 - 10.5 K/uL   RBC 4.42 4.22 - 5.81 MIL/uL   Hemoglobin 13.9 13.0 - 17.0 g/dL   HCT 41.3 39.0 - 52.0 %   MCV 93.4 80.0 - 100.0 fL   MCH 31.4 26.0 - 34.0 pg   MCHC 33.7 30.0 - 36.0 g/dL   RDW 17.1 (H) 11.5 - 15.5 %   Platelets 110 (L) 150 - 400 K/uL    Comment: Immature Platelet Fraction may be clinically indicated, consider ordering this additional test [id number] CONSISTENT WITH PREVIOUS RESULT    nRBC 0.0 0.0 - 0.2 %    Comment:  Performed at [facility], [place] [address], [place], [place] [address]  Magnesium     Status: Abnormal   Collection Time: 11/15/21  6:03 AM  Result Value Ref Range   Magnesium 1.6 (L) 1.7 - 2.4 mg/dL    Comment: Performed at [facility], [place] [address], [place], [place] [address]  Heparin level (unfractionated)     Status: Abnormal   Collection Time: 11/15/21  6:03 AM  Result Value Ref Range   Heparin Unfractionated >1.10 (H) 0.30 - 0.70 IU/mL    Comment: (NOTE) The clinical reportable range upper limit is being lowered to >1.10 to align with the FDA approved guidance for the current  laboratory assay.  If heparin results are below expected values, and patient dosage has  been confirmed, suggest follow up testing of antithrombin III levels. Performed at [facility], [place] [address] [place], [place] [address]   APTT     Status: Abnormal   Collection Time: 11/15/21  6:03 AM  Result Value Ref Range   aPTT 114 (H) 24 - 36 seconds    Comment:        IF BASELINE aPTT IS ELEVATED, SUGGEST PATIENT RISK ASSESSMENT BE USED TO DETERMINE APPROPRIATE ANTICOAGULANT THERAPY. Performed at [facility], [place] [address] [place], [place] [address]      ROS:  A comprehensive review of systems was negative except for: Respiratory: positive for dyspnea on exertion and wheezing Cardiovascular: positive for lower extremity edema and paroxysmal nocturnal dyspnea  Physical Exam: Vitals:   11/15/21 0839 11/15/21 1123  BP: 100/75 104/81  Pulse: 72 73  Resp: (!) 22 (!) 24  Temp: 97.8 F (36.6 C) 98.5 F (36.9 C)  SpO2: 96% 90%     General: thin BM- appears older than 66  alert conversive NAD but moves slowly -  limited by shortness of breath HEENT: PERRLA, EOMI-  mucous membranes moist  Neck:  no JVD Heart: RRR-  murm Lungs: poor air movement-  not really rales-   Abdomen: soft, non tender Extremities: trace to 1+ edema on legs Skin: warm and dry Neuro: alert, non focal   Assessment/Plan: 66 year old BM with significant pulmonary pathology and now new worsening cardiac pathology and also with A on CRF 1.Renal- patient with some baseline CKD- not entirely clear why-  renal ultrasound does show smaller right kidney so question some vascular insufficiency.  Now with A on CRF I think mostly due to the hypotension brought on by diuresis but not necessarily volume depleted.  He also has been given moderate dose metoprolol which can lower BP.  I will check U/A but feel the key here is improving his hemodynamics--  since his HR is fine-  I have  taken the liberty of decreasing his metoprolol-  I will consider midodrine if BP is still low-  dont know if there is a role at all for inotropes  in this particular case of heart failure.  I would not pursue any invasive studies for renal artery stenosis as risk would like be more than benefit-  doppler is being done.  There are no indications for dialysis -  hoping if we can keep BP higher-  the better renal perfusion will lead to improvement in renal function  2. Hypertension/volume  - as above -  hypotension is an issue-  have decreased the metoprolol-  He may still be a little volume overloaded but would not add back diuretics at  this time-  feel his SOB is also due to his COPD which seems pretty severe    Louis Meckel 11/15/2021, 1:47 PM

## 2021-11-15 NOTE — Progress Notes (Signed)
Patient accidentally pulled PIV while using the commode.  Bleeding at site.  Assisted back in bed, pressure and dressing applied to old IV site.   Heparin infusion stopped, pharmacy aware.  IV consult placed for PIV access.

## 2021-11-15 NOTE — Progress Notes (Addendum)
Progress Note  Patient Name: Aaron Fuller Date of Encounter: 11/15/2021  [facility] Cardiologist: Fransico Him, MD   Subjective   Denies any CP or SOB.   Inpatient Medications    Scheduled Meds:  atorvastatin  20 mg Oral q1800   metoprolol succinate  75 mg Oral Daily   nicotine  14 mg Transdermal Daily   sodium chloride flush  3 mL Intravenous Q12H   umeclidinium-vilanterol  1 puff Inhalation Daily   Continuous Infusions:  heparin 1,100 Units/hr (11/14/21 2145)   PRN Meds: acetaminophen **OR** acetaminophen, guaiFENesin, hydrALAZINE, ipratropium-albuterol, metoprolol tartrate, ondansetron **OR** ondansetron (ZOFRAN) IV, senna-docusate, traZODone   Vital Signs    Vitals:   11/14/21 1705 11/14/21 2124 11/15/21 0500 11/15/21 0513  BP: 104/73 97/78  (!) 87/56  Pulse: 77 75  75  Resp: (!) 26 20  20   Temp: 98.2 F (36.8 C) 98.1 F (36.7 C)  97.6 F (36.4 C)  TempSrc: Oral Oral  Oral  SpO2: 100% 94%  93%  Weight:   76.8 kg   Height:        Intake/Output Summary (Last 24 hours) at 11/15/2021 0742 Last data filed at 11/15/2021 0513 Gross per 24 hour  Intake 832.42 ml  Output 500 ml  Net 332.42 ml      11/15/2021    5:00 AM 11/14/2021    5:00 AM 11/13/2021    5:00 AM  Last 3 Weights  Weight (lbs) 169 lb 5 oz 166 lb 7.2 oz 162 lb 7.7 oz  Weight (kg) 76.8 kg 75.5 kg 73.7 kg      Telemetry    NSR without significant ventricular ectopy - Personally Reviewed  ECG    NSR without poor R wave progression in the anterior leads - Personally Reviewed  Physical Exam   GEN: No acute distress.   Neck: No JVD Cardiac: RRR, no murmurs, rubs, or gallops.  Respiratory: Clear to auscultation bilaterally. GI: Soft, nontender, non-distended  MS: No edema; No deformity. Neuro:  Nonfocal  Psych: Normal affect   Labs    High Sensitivity Troponin:  No results for input(s): TROPONINIHS in the last 720 hours.   Chemistry Recent Labs  Lab 11/13/21 0347 11/14/21 0357  11/15/21 0603  NA 139 138 137  K 4.4 4.5 4.5  CL 99 99 97*  CO2 31 32 32  GLUCOSE 131* 103* 91  BUN 26* 34* 40*  CREATININE 1.56* 2.46* 2.61*  CALCIUM 8.6* 8.6* 8.9  MG 1.7 1.7 1.6*  GFRNONAA 48* 28* 26*  ANIONGAP 9 7 8     Lipids No results for input(s): CHOL, TRIG, HDL, LABVLDL, LDLCALC, CHOLHDL in the last 168 hours.  Hematology Recent Labs  Lab 11/13/21 0347 11/14/21 0357 11/15/21 0603  WBC 1.8* 5.4 5.1  RBC 4.70 4.51 4.42  HGB 14.6 14.0 13.9  HCT 44.5 43.0 41.3  MCV 94.7 95.3 93.4  MCH 31.1 31.0 31.4  MCHC 32.8 32.6 33.7  RDW 17.1* 17.1* 17.1*  PLT 101* 107* 110*   Thyroid No results for input(s): TSH, FREET4 in the last 168 hours.  BNP Recent Labs  Lab 11/12/21 1838  BNP 678.8*    DDimer No results for input(s): DDIMER in the last 168 hours.   Radiology    ECHOCARDIOGRAM COMPLETE  Result Date: 11/13/2021    ECHOCARDIOGRAM REPORT   Patient Name:   Aaron Fuller Date of Exam: 11/13/2021 Medical Rec #:  [id number]  Height:       68.5 in Accession #:  [id number] Weight:       162.5 lb Date of Birth:  1955-09-08  BSA:          1.881 m Patient Age:    66 years   BP:           128/89 mmHg Patient Gender: M          HR:           89 bpm. Exam Location:  Inpatient Procedure: 2D Echo, Cardiac Doppler and Color Doppler Indications:    Cardiomyopathy  History:        Patient has prior history of Echocardiogram examinations, most                 recent 09/28/2019. CHF, COPD; Arrythmias:Tachycardia.  Sonographer:    Jefferey Pica Referring Phys: [id number] ANKIT CHIRAG AMIN IMPRESSIONS  1. Left ventricular ejection fraction, by estimation, is 40 to 45%. The left ventricle has mildly decreased function. The left ventricle demonstrates global hypokinesis. There is severe concentric left ventricular hypertrophy. Left ventricular diastolic  parameters are consistent with Grade II diastolic dysfunction (pseudonormalization). There is the interventricular septum is flattened in diastole ('D'  shaped left ventricle), consistent with right ventricular volume overload.  2. Right ventricular systolic function is moderately reduced. The right ventricular size is moderately enlarged. Mildly increased right ventricular wall thickness. There is moderately elevated pulmonary artery systolic pressure. The estimated right ventricular systolic pressure is [id number] mmHg.  3. Left atrial size was mildly dilated.  4. Right atrial size was severely dilated.  5. A small pericardial effusion is present. No evidence of tamponade.  6. The mitral valve is grossly normal. Trivial mitral valve regurgitation.  7. Tricuspid valve regurgitation is severe.  8. The aortic valve is tricuspid. Aortic valve regurgitation is mild to moderate.  9. Pulmonic valve regurgitation is moderate. 10. Moderately dilated pulmonary artery. 11. The inferior vena cava is dilated in size with <50% respiratory variability, suggesting right atrial pressure of 15 mmHg. Comparison(s): Significant changes: worsening LVEF with torrential, functional tricuspid regurgitation. Conclusion(s)/Recommendation(s): Notable hypertrophy, atrial dilation, and effusion: amyloid nuclear imaging may be indicated as outpatient. FINDINGS  Left Ventricle: Left ventricular ejection fraction, by estimation, is 40 to 45%. The left ventricle has mildly decreased function. The left ventricle demonstrates global hypokinesis. The left ventricular internal cavity size was small. There is severe concentric left ventricular hypertrophy. The interventricular septum is flattened in diastole ('D' shaped left ventricle), consistent with right ventricular volume overload. Left ventricular diastolic parameters are consistent with Grade II diastolic dysfunction (pseudonormalization). Right Ventricle: The right ventricular size is moderately enlarged. Mildly increased right ventricular wall thickness. Right ventricular systolic function is moderately reduced. There is moderately elevated  pulmonary artery systolic pressure. The tricuspid regurgitant velocity is 3.03 m/s, and with an assumed right atrial pressure of 15 mmHg, the estimated right ventricular systolic pressure is [id number] mmHg. Left Atrium: Left atrial size was mildly dilated. Right Atrium: Right atrial size was severely dilated. Pericardium: A small pericardial effusion is present. Mitral Valve: The mitral valve is grossly normal. Trivial mitral valve regurgitation. Tricuspid Valve: The tricuspid valve is normal in structure. Tricuspid valve regurgitation is severe. Aortic Valve: The aortic valve is tricuspid. Aortic valve regurgitation is mild to moderate. Aortic regurgitation PHT measures 667 msec. Aortic valve peak gradient measures 8.1 mmHg. Pulmonic Valve: The pulmonic valve was normal in structure. Pulmonic valve regurgitation is moderate. No evidence of pulmonic stenosis. Aorta: The aortic root and ascending aorta are structurally normal, with  no evidence of dilitation. Pulmonary Artery: The pulmonary artery is moderately dilated. Venous: The inferior vena cava is dilated in size with less than 50% respiratory variability, suggesting right atrial pressure of 15 mmHg. IAS/Shunts: There is right bowing of the interatrial septum, suggestive of elevated left atrial pressure. No atrial level shunt detected by color flow Doppler.  LEFT VENTRICLE PLAX 2D LVIDd:         3.15 cm      Diastology LVIDs:         2.60 cm      LV e' medial:    7.54 cm/s LV PW:         2.05 cm      LV E/e' medial:  4.5 LV IVS:        1.82 cm      LV e' lateral:   7.51 cm/s LVOT diam:     1.80 cm      LV E/e' lateral: 4.5 LV SV:         55 LV SV Index:   29 LVOT Area:     2.54 cm  LV Volumes (MOD) LV vol d, MOD A4C: 119.0 ml LV vol s, MOD A4C: 84.2 ml LV SV MOD A4C:     119.0 ml RIGHT VENTRICLE          IVC RV Basal diam:  4.40 cm  IVC diam: 3.10 cm RV Mid diam:    5.00 cm TAPSE (M-mode): 1.6 cm LEFT ATRIUM             Index        RIGHT ATRIUM           Index LA  diam:        4.40 cm 2.34 cm/m   RA Area:     30.60 cm LA Vol (A2C):   69.3 ml 36.84 ml/m  RA Volume:   131.00 ml 69.64 ml/m LA Vol (A4C):   47.7 ml 25.36 ml/m LA Biplane Vol: 60.0 ml 31.89 ml/m  AORTIC VALVE                 PULMONIC VALVE AV Area (Vmax): 2.51 cm     PV Vmax:       0.90 m/s AV Vmax:        142.00 cm/s  PV Peak grad:  3.2 mmHg AV Peak Grad:   8.1 mmHg LVOT Vmax:      140.00 cm/s LVOT Vmean:     79.400 cm/s LVOT VTI:       0.218 m AI PHT:         667 msec  AORTA Ao Asc diam: 3.50 cm MITRAL VALVE               TRICUSPID VALVE MV Area (PHT): 5.16 cm    TR Peak grad:   36.7 mmHg MV Decel Time: 147 msec    TR Vmax:        303.00 cm/s MV E velocity: 33.80 cm/s MV A velocity: 75.00 cm/s  SHUNTS MV E/A ratio:  0.45        Systemic VTI:  0.22 m                            Systemic Diam: 1.80 cm Rudean Haskell MD Electronically signed by Rudean Haskell MD Signature Date/Time: 11/13/2021/12:40:01 PM    Final    VAS [place] LOWER EXTREMITY VENOUS (DVT)  Result Date: 11/14/2021  Lower Venous DVT Study Patient Name:  Aaron Fuller  Date of Exam:   11/14/2021 Medical Rec #: [id number]   Accession #:    [id number] Date of Birth: [birth year]   Patient Gender: M Patient Age:   66 years Exam Location:  [facility] Procedure:      VAS [place] LOWER EXTREMITY VENOUS (DVT) Referring Phys: Freda Jackson --------------------------------------------------------------------------------  Indications: Edema.  Risk Factors: None identified. Limitations: Poor ultrasound/tissue interface. Comparison Study: No prior studies. Performing Technologist: Oliver Hum RVT  Examination Guidelines: A complete evaluation includes B-mode imaging, spectral Doppler, color Doppler, and power Doppler as needed of all accessible portions of each vessel. Bilateral testing is considered an integral part of a complete examination. Limited examinations for reoccurring indications may be performed as noted. The reflux portion of the  exam is performed with the patient in reverse Trendelenburg.  +---------+---------------+---------+-----------+----------+--------------+ RIGHT    CompressibilityPhasicitySpontaneityPropertiesThrombus Aging +---------+---------------+---------+-----------+----------+--------------+ CFV      Full           Yes      No                                  +---------+---------------+---------+-----------+----------+--------------+ SFJ      Full                                                        +---------+---------------+---------+-----------+----------+--------------+ FV Prox  Full                                                        +---------+---------------+---------+-----------+----------+--------------+ FV Mid   Full                                                        +---------+---------------+---------+-----------+----------+--------------+ FV DistalFull                                                        +---------+---------------+---------+-----------+----------+--------------+ PFV      Full                                                        +---------+---------------+---------+-----------+----------+--------------+ POP      Full           Yes      No                                  +---------+---------------+---------+-----------+----------+--------------+ PTV      Full                                                        +---------+---------------+---------+-----------+----------+--------------+  PERO     Full                                                        +---------+---------------+---------+-----------+----------+--------------+   +---------+---------------+---------+-----------+----------+--------------+ LEFT     CompressibilityPhasicitySpontaneityPropertiesThrombus Aging +---------+---------------+---------+-----------+----------+--------------+ CFV      Full           Yes      No                                   +---------+---------------+---------+-----------+----------+--------------+ SFJ      Full                                                        +---------+---------------+---------+-----------+----------+--------------+ FV Prox  Full                                                        +---------+---------------+---------+-----------+----------+--------------+ FV Mid   Full                                                        +---------+---------------+---------+-----------+----------+--------------+ FV DistalFull                                                        +---------+---------------+---------+-----------+----------+--------------+ PFV      Full                                                        +---------+---------------+---------+-----------+----------+--------------+ POP      Full           Yes      No                                  +---------+---------------+---------+-----------+----------+--------------+ PTV      Full                                                        +---------+---------------+---------+-----------+----------+--------------+ PERO     Full                                                        +---------+---------------+---------+-----------+----------+--------------+  Summary: RIGHT: - There is no evidence of deep vein thrombosis in the lower extremity.  - No cystic structure found in the popliteal fossa.  LEFT: - There is no evidence of deep vein thrombosis in the lower extremity.  - No cystic structure found in the popliteal fossa.  *See table(s) above for measurements and observations. Electronically signed by Harold Barban MD on 11/14/2021 at 8:54:21 PM.    Final     Cardiac Studies   Echo 09/28/2019 1. Left ventricular ejection fraction, by estimation, is 55 to 60%. The  left ventricle has normal function. The left ventricle has no regional  wall motion abnormalities. There is mild  concentric left ventricular  hypertrophy. Left ventricular diastolic  parameters are consistent with Grade I diastolic dysfunction (impaired  relaxation).   2. Right ventricular systolic function is moderately reduced. The right  ventricular size is moderately enlarged. There is moderately elevated  pulmonary artery systolic pressure. The estimated right ventricular  systolic pressure is [id number] mmHg.   3. Right atrial size was moderately dilated.   4. The mitral valve is normal in structure. No evidence of mitral valve  regurgitation.   5. The aortic valve is tricuspid. Aortic valve regurgitation is moderate.  No aortic stenosis is present.   6. The inferior vena cava is dilated in size with <50% respiratory  variability, suggesting right atrial pressure of 15 mmHg.     Cath 09/29/2019 Hemodynamic findings consistent with moderate pulmonary hypertension.   Moderately elevated right heart pressures with PA pressure 63/28 and mean pressure 39 mmHg consistent with at least moderate pulmonary hypertension.   PVR: 2.9 WU   LVEDP 17 - 18 mmHg.   Normal epicardial coronary arteries.   RECOMMENDATION: Optimization of fluid status, blood pressure, and cardiac rhythm in this patient diastolic dysfunction who presented with SVT/atrial flutter.  Smoking cessation is essential.     Echo 11/13/2021 1. Left ventricular ejection fraction, by estimation, is 40 to 45%. The  left ventricle has mildly decreased function. The left ventricle  demonstrates global hypokinesis. There is severe concentric left  ventricular hypertrophy. Left ventricular diastolic   parameters are consistent with Grade II diastolic dysfunction  (pseudonormalization). There is the interventricular septum is flattened  in diastole ('D' shaped left ventricle), consistent with right ventricular  volume overload.   2. Right ventricular systolic function is moderately reduced. The right  ventricular size is moderately enlarged.  Mildly increased right  ventricular wall thickness. There is moderately elevated pulmonary artery  systolic pressure. The estimated right  ventricular systolic pressure is [id number] mmHg.   3. Left atrial size was mildly dilated.   4. Right atrial size was severely dilated.   5. A small pericardial effusion is present. No evidence of tamponade.   6. The mitral valve is grossly normal. Trivial mitral valve  regurgitation.   7. Tricuspid valve regurgitation is severe.   8. The aortic valve is tricuspid. Aortic valve regurgitation is mild to  moderate.   9. Pulmonic valve regurgitation is moderate.  10. Moderately dilated pulmonary artery.  11. The inferior vena cava is dilated in size with <50% respiratory  variability, suggesting right atrial pressure of 15 mmHg.   Comparison(s): Significant changes: worsening LVEF with torrential,  functional tricuspid regurgitation.   Conclusion(s)/Recommendation(s): Notable hypertrophy, atrial dilation, and  effusion: amyloid nuclear imaging may be indicated as outpatient.   Patient Profile     66 y.o. 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 Reesa Chew.  Assessment & Plan    Acute on chronic combined systolic and diastolic heart failure:        -Normally ejection fraction on echocardiogram in 2021, normal coronary arteries with pulmonary hypertension on previous right heart cath in 2021.             -Recently stopped all cardiac medications for several months due to the passing of 2 brothers and 3 sisters within a span of 4 to 5 months.  Resumed metoprolol succinate and Eliquis a few months ago, however Eliquis was never resumed.             -Felt to be volume overloaded on  arrival, treated with 40 mg IV twice daily of Lasix, creatinine initially improved to 1.5 however jumped to 2.4 then 2.6.             -Hold Lasix for now, patient appears to be euvolemic on exam.    -Eliquis held after the morning dose on 5/24, pending Right heart cath tomorrow. Unable to do LHC given Cr 2.6. Patient is agreeable to proceed              Acute on chronic renal insufficiency: Creatinine 1.83-->1.56-->2.46       -MD to consider if need to obtain a renal artery Doppler given the drastic jump in the creatinine. Currently a renal doppler is ordered. Patient was referred to vascular surgery last year for evaluation of celiac artery stenosis.   EtOH and tobacco use: He smokes half a pack per day.  Per patient, he previously managed to quit drinking for many years, however recently resumed alcohol after the passing away of multiple siblings   Paroxysmal atrial flutter: On Eliquis and metoprolol succinate at home   Hypertension   History of pulmonary hypertension   Severe COPD: Followed by pulmonology service.     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

## 2021-11-15 NOTE — Progress Notes (Signed)
Elevated PROGRESS NOTE    Aaron Fuller  [id number] DOB: 1955/08/13 DOA: 11/12/2021 PCP: Wynn Banker, PA   Brief Narrative:  66 year old with history of COPD, chronic hypoxia/hypercarbic respiratory failure, diastolic CHF, paroxysmal atrial flutter on Eliquis, tobacco dependence, alcohol use in remission, CKD stage III, hep C came to the ED with complaints of shortness of breath.  Unfortunately patient's multiple siblings passed away over the last few months therefore he quit taking all of his medications and was drinking excessively but stopped about 2 months ago.  He did resume his metoprolol and Eliquis but has not taken his Lasix in several months.  In the ED he was noted to have signs of volume overload with lower extremity swelling, orthopnea, hypoxia, elevated BNP and chest x-ray suggestive of slight volume overload.  Patient was started on IV Lasix 40 mg twice daily, his repeat echo showed EF 40%, grade 2 DD, PASP and moderately reduced RV function.  Due to rising creatinine, his Lasix was discontinued.  Due to abnormal echocardiogram, cardiology team was consulted. Recommended RHC 5/26, therefore ptn will be transferred there and stay there.    Assessment & Plan:  Principal Problem:   Acute on chronic diastolic CHF (congestive heart failure) (HCC) Active Problems:   Paroxysmal atrial flutter (HCC)   Emphysema of lung (HCC)   CKD (chronic kidney disease) stage 3, GFR 30-59 ml/min (HCC)   Chronic respiratory failure with hypoxia and hypercapnia (HCC)   Thrombocytopenia (HCC)   COPD exacerbation (HCC)    Acute congestive heart failure with preserved ejection fraction, class III.  EF 60%, grade 1 DD Pulmonary hypertension, likely from COPD -He has signs of CHF exacerbation including orthopnea, hypoxia and lower extremity swelling.  BNP elevated.  Echocardiogram in 2021 showed EF 60%, grade 1 DD, elevated PASP and mildly reduced RV function.  Repeat echocardiogram shows EF  40%, grade 2 DD, elevated PASP and moderately reduced RV function. Likely RHC.  - Lower extremity Dopplers is negative.  Patient has been seen by cardiology team, planning on right heart catheterization on 5/26 and thereafter patient will stay at [facility]. No plans for left heart catheterization until renal function improves.   AKI on Chronic kidney disease stage IIIa -Baseline creatinine 1.4, admission creatinine 1.83.  Creatinine today is 2.46> 2.6. Likely from diuretics Use. Renal US ordered. Nephro consulted as well.  Bladder scan didn't show any retention.    COPD; chronic hypoxic and hypercarbic respiratory failure  - Continue 3 L home nasal cannula, as needed bronchodilators.  Paroxysmal atrial flutter -Continue Toprol-XL, On Heparin Drip. Holding Home Eliquis. On IV Lopressor as needed.   Thrombocytopenia and leukopenia Alcohol dependence -Suspect from chronic alcohol use and hepatitis C.  No evidence of bleeding and active infection.  We will continue to monitor this -States his last alcoholic drink was over 2 months ago.   History of hepatitis C Patient was seen in GI clinic back in August 2022 but never followed up for treatment.  Would recommend following up with GI/infectious disease outpatient again   DVT prophylaxis: On Heparin Drip.  Code Status: Full code Family Communication:    Patient will be transferred to [facility] tomorrow to the Cath Lab for right heart cath thereafter patient will stay there for further evaluation by CHF team.  Subjective: Seen and examined at bedside, does not any complaints this morning.  Tells me after diuresis his breathing has improved.  Examination: Constitutional: Not in acute distress Respiratory: Clear  to auscultation bilaterally Cardiovascular: Normal sinus rhythm, no rubs.  12 cm JVD Abdomen: Nontender nondistended good bowel sounds Musculoskeletal: No edema noted Skin: No rashes seen Neurologic: CN 2-12  grossly intact.  And nonfocal Psychiatric: Normal judgment and insight. Alert and oriented x 3. Normal mood.  Objective: Vitals:   11/14/21 1705 11/14/21 2124 11/15/21 0500 11/15/21 0513  BP: 104/73 97/78  (!) 87/56  Pulse: 77 75  75  Resp: (!) 26 20  20   Temp: 98.2 F (36.8 C) 98.1 F (36.7 C)  97.6 F (36.4 C)  TempSrc: Oral Oral  Oral  SpO2: 100% 94%  93%  Weight:   76.8 kg   Height:        Intake/Output Summary (Last 24 hours) at 11/15/2021 0756 Last data filed at 11/15/2021 0513 Gross per 24 hour  Intake 832.42 ml  Output 500 ml  Net 332.42 ml   Filed Weights   11/13/21 0500 11/14/21 0500 11/15/21 0500  Weight: 73.7 kg 75.5 kg 76.8 kg     Data Reviewed:   CBC: Recent Labs  Lab 11/12/21 1838 11/13/21 0347 11/14/21 0357 11/15/21 0603  WBC 3.6* 1.8* 5.4 5.1  NEUTROABS 1.8 1.7  --   --   HGB 15.6 14.6 14.0 13.9  HCT 47.1 44.5 43.0 41.3  MCV 94.6 94.7 95.3 93.4  PLT 110* 101* 107* [id number]*   Basic Metabolic Panel: Recent Labs  Lab 11/12/21 1838 11/13/21 0347 11/14/21 0357 11/15/21 0603  NA 138 139 138 137  K 3.9 4.4 4.5 4.5  CL 96* 99 99 97*  CO2 37* 31 32 32  GLUCOSE 133* 131* 103* 91  BUN 25* 26* 34* 40*  CREATININE 1.83* 1.56* 2.46* 2.61*  CALCIUM 9.0 8.6* 8.6* 8.9  MG  --  1.7 1.7 1.6*   GFR: Estimated Creatinine Clearance: 26.7 mL/min (A) (by C-G formula based on SCr of 2.61 mg/dL (H)). Liver Function Tests: No results for input(s): AST, ALT, ALKPHOS, BILITOT, PROT, ALBUMIN in the last 168 hours. No results for input(s): LIPASE, AMYLASE in the last 168 hours. No results for input(s): AMMONIA in the last 168 hours. Coagulation Profile: No results for input(s): INR, PROTIME in the last 168 hours. Cardiac Enzymes: No results for input(s): CKTOTAL, CKMB, CKMBINDEX, TROPONINI in the last 168 hours. BNP (last 3 results) No results for input(s): PROBNP in the last 8760 hours. HbA1C: No results for input(s): HGBA1C in the last 72 hours. CBG: No  results for input(s): GLUCAP in the last 168 hours. Lipid Profile: No results for input(s): CHOL, HDL, LDLCALC, TRIG, CHOLHDL, LDLDIRECT in the last 72 hours. Thyroid Function Tests: No results for input(s): TSH, T4TOTAL, FREET4, T3FREE, THYROIDAB in the last 72 hours. Anemia Panel: No results for input(s): VITAMINB12, FOLATE, FERRITIN, TIBC, IRON, RETICCTPCT in the last 72 hours. Sepsis Labs: No results for input(s): PROCALCITON, LATICACIDVEN in the last 168 hours.  Recent Results (from the past 240 hour(s))  Resp Panel by RT-PCR (Flu A&B, Covid) Nasopharyngeal Swab     Status: None   Collection Time: 11/12/21  6:38 PM   Specimen: Nasopharyngeal Swab; Nasopharyngeal(NP) swabs in vial transport medium  Result Value Ref Range Status   SARS Coronavirus 2 by RT PCR NEGATIVE NEGATIVE Final    Comment: (NOTE) SARS-CoV-2 target nucleic acids are NOT DETECTED.  The SARS-CoV-2 RNA is generally detectable in upper respiratory specimens during the acute phase of infection. The lowest concentration of SARS-CoV-2 viral copies this assay can detect is 138 copies/mL. A negative  result does not preclude SARS-Cov-2 infection and should not be used as the sole basis for treatment or other patient management decisions. A negative result may occur with  improper specimen collection/handling, submission of specimen other than nasopharyngeal swab, presence of viral mutation(s) within the areas targeted by this assay, and inadequate number of viral copies(<138 copies/mL). A negative result must be combined with clinical observations, patient history, and epidemiological information. The expected result is Negative.  Fact Sheet for Patients:  EntrepreneurPulse.com.au  Fact Sheet for Healthcare Providers:  IncredibleEmployment.be  This test is no t yet approved or cleared by the [place] FDA and  has been authorized for detection and/or diagnosis of SARS-CoV-2  by FDA under an Emergency Use Authorization (EUA). This EUA will remain  in effect (meaning this test can be used) for the duration of the COVID-19 declaration under Section 564(b)(1) of the Act, 21 U.S.C.section 360bbb-3(b)(1), unless the authorization is terminated  or revoked sooner.       Influenza A by PCR NEGATIVE NEGATIVE Final   Influenza B by PCR NEGATIVE NEGATIVE Final    Comment: (NOTE) The Xpert Xpress SARS-CoV-2/FLU/RSV plus assay is intended as an aid in the diagnosis of influenza from Nasopharyngeal swab specimens and should not be used as a sole basis for treatment. Nasal washings and aspirates are unacceptable for Xpert Xpress SARS-CoV-2/FLU/RSV testing.  Fact Sheet for Patients: EntrepreneurPulse.com.au  Fact Sheet for Healthcare Providers: IncredibleEmployment.be  This test is not yet approved or cleared by the [place] FDA and has been authorized for detection and/or diagnosis of SARS-CoV-2 by FDA under an Emergency Use Authorization (EUA). This EUA will remain in effect (meaning this test can be used) for the duration of the COVID-19 declaration under Section 564(b)(1) of the Act, 21 U.S.C. section 360bbb-3(b)(1), unless the authorization is terminated or revoked.  Performed at [facility], [address] [place], [place] [address]          Radiology Studies: ECHOCARDIOGRAM COMPLETE  Result Date: 11/13/2021    ECHOCARDIOGRAM REPORT   Patient Name:   Aaron Fuller Date of Exam: 11/13/2021 Medical Rec #:  [id number]  Height:       68.5 in Accession #:    [id number] Weight:       162.5 lb Date of Birth:  1955/11/08  BSA:          1.881 m Patient Age:    74 years   BP:           128/89 mmHg Patient Gender: M          HR:           89 bpm. Exam Location:  Inpatient Procedure: 2D Echo, Cardiac Doppler and Color Doppler Indications:    Cardiomyopathy  History:        Patient has prior history of Echocardiogram  examinations, most                 recent 09/28/2019. CHF, COPD; Arrythmias:Tachycardia.  Sonographer:    Jefferey Pica Referring Phys: [id number] Lillieanna Tuohy CHIRAG Shataria Crist IMPRESSIONS  1. Left ventricular ejection fraction, by estimation, is 40 to 45%. The left ventricle has mildly decreased function. The left ventricle demonstrates global hypokinesis. There is severe concentric left ventricular hypertrophy. Left ventricular diastolic  parameters are consistent with Grade II diastolic dysfunction (pseudonormalization). There is the interventricular septum is flattened in diastole ('D' shaped left ventricle), consistent with right ventricular volume overload.  2. Right ventricular systolic function is moderately  reduced. The right ventricular size is moderately enlarged. Mildly increased right ventricular wall thickness. There is moderately elevated pulmonary artery systolic pressure. The estimated right ventricular systolic pressure is [id number] mmHg.  3. Left atrial size was mildly dilated.  4. Right atrial size was severely dilated.  5. A small pericardial effusion is present. No evidence of tamponade.  6. The mitral valve is grossly normal. Trivial mitral valve regurgitation.  7. Tricuspid valve regurgitation is severe.  8. The aortic valve is tricuspid. Aortic valve regurgitation is mild to moderate.  9. Pulmonic valve regurgitation is moderate. 10. Moderately dilated pulmonary artery. 11. The inferior vena cava is dilated in size with <50% respiratory variability, suggesting right atrial pressure of 15 mmHg. Comparison(s): Significant changes: worsening LVEF with torrential, functional tricuspid regurgitation. Conclusion(s)/Recommendation(s): Notable hypertrophy, atrial dilation, and effusion: amyloid nuclear imaging may be indicated as outpatient. FINDINGS  Left Ventricle: Left ventricular ejection fraction, by estimation, is 40 to 45%. The left ventricle has mildly decreased function. The left ventricle demonstrates global  hypokinesis. The left ventricular internal cavity size was small. There is severe concentric left ventricular hypertrophy. The interventricular septum is flattened in diastole ('D' shaped left ventricle), consistent with right ventricular volume overload. Left ventricular diastolic parameters are consistent with Grade II diastolic dysfunction (pseudonormalization). Right Ventricle: The right ventricular size is moderately enlarged. Mildly increased right ventricular wall thickness. Right ventricular systolic function is moderately reduced. There is moderately elevated pulmonary artery systolic pressure. The tricuspid regurgitant velocity is 3.03 m/s, and with an assumed right atrial pressure of 15 mmHg, the estimated right ventricular systolic pressure is [id number] mmHg. Left Atrium: Left atrial size was mildly dilated. Right Atrium: Right atrial size was severely dilated. Pericardium: A small pericardial effusion is present. Mitral Valve: The mitral valve is grossly normal. Trivial mitral valve regurgitation. Tricuspid Valve: The tricuspid valve is normal in structure. Tricuspid valve regurgitation is severe. Aortic Valve: The aortic valve is tricuspid. Aortic valve regurgitation is mild to moderate. Aortic regurgitation PHT measures 667 msec. Aortic valve peak gradient measures 8.1 mmHg. Pulmonic Valve: The pulmonic valve was normal in structure. Pulmonic valve regurgitation is moderate. No evidence of pulmonic stenosis. Aorta: The aortic root and ascending aorta are structurally normal, with no evidence of dilitation. Pulmonary Artery: The pulmonary artery is moderately dilated. Venous: The inferior vena cava is dilated in size with less than 50% respiratory variability, suggesting right atrial pressure of 15 mmHg. IAS/Shunts: There is right bowing of the interatrial septum, suggestive of elevated left atrial pressure. No atrial level shunt detected by color flow Doppler.  LEFT VENTRICLE PLAX 2D LVIDd:         3.15 cm       Diastology LVIDs:         2.60 cm      LV e' medial:    7.54 cm/s LV PW:         2.05 cm      LV E/e' medial:  4.5 LV IVS:        1.82 cm      LV e' lateral:   7.51 cm/s LVOT diam:     1.80 cm      LV E/e' lateral: 4.5 LV SV:         55 LV SV Index:   29 LVOT Area:     2.54 cm  LV Volumes (MOD) LV vol d, MOD A4C: 119.0 ml LV vol s, MOD A4C: 84.2 ml LV SV MOD A4C:  119.0 ml RIGHT VENTRICLE          IVC RV Basal diam:  4.40 cm  IVC diam: 3.10 cm RV Mid diam:    5.00 cm TAPSE (M-mode): 1.6 cm LEFT ATRIUM             Index        RIGHT ATRIUM           Index LA diam:        4.40 cm 2.34 cm/m   RA Area:     30.60 cm LA Vol (A2C):   69.3 ml 36.84 ml/m  RA Volume:   131.00 ml 69.64 ml/m LA Vol (A4C):   47.7 ml 25.36 ml/m LA Biplane Vol: 60.0 ml 31.89 ml/m  AORTIC VALVE                 PULMONIC VALVE AV Area (Vmax): 2.51 cm     PV Vmax:       0.90 m/s AV Vmax:        142.00 cm/s  PV Peak grad:  3.2 mmHg AV Peak Grad:   8.1 mmHg LVOT Vmax:      140.00 cm/s LVOT Vmean:     79.400 cm/s LVOT VTI:       0.218 m AI PHT:         667 msec  AORTA Ao Asc diam: 3.50 cm MITRAL VALVE               TRICUSPID VALVE MV Area (PHT): 5.16 cm    TR Peak grad:   36.7 mmHg MV Decel Time: 147 msec    TR Vmax:        303.00 cm/s MV E velocity: 33.80 cm/s MV A velocity: 75.00 cm/s  SHUNTS MV E/A ratio:  0.45        Systemic VTI:  0.22 m                            Systemic Diam: 1.80 cm Riley Lam MD Electronically signed by Riley Lam MD Signature Date/Time: 11/13/2021/12:40:01 PM    Final    VAS [place] LOWER EXTREMITY VENOUS (DVT)  Result Date: 11/14/2021  Lower Venous DVT Study Patient Name:  Aaron Fuller  Date of Exam:   11/14/2021 Medical Rec #: [id number]   Accession #:    [id number] Date of Birth: 03-Apr-1956   Patient Gender: M Patient Age:   66 years Exam Location:  [facility] Procedure:      VAS [place] LOWER EXTREMITY VENOUS (DVT) Referring Phys: Melody Comas  --------------------------------------------------------------------------------  Indications: Edema.  Risk Factors: None identified. Limitations: Poor ultrasound/tissue interface. Comparison Study: No prior studies. Performing Technologist: Chanda Busing RVT  Examination Guidelines: A complete evaluation includes B-mode imaging, spectral Doppler, color Doppler, and power Doppler as needed of all accessible portions of each vessel. Bilateral testing is considered an integral part of a complete examination. Limited examinations for reoccurring indications may be performed as noted. The reflux portion of the exam is performed with the patient in reverse Trendelenburg.  +---------+---------------+---------+-----------+----------+--------------+ RIGHT    CompressibilityPhasicitySpontaneityPropertiesThrombus Aging +---------+---------------+---------+-----------+----------+--------------+ CFV      Full           Yes      No                                  +---------+---------------+---------+-----------+----------+--------------+ SFJ  Full                                                        +---------+---------------+---------+-----------+----------+--------------+ FV Prox  Full                                                        +---------+---------------+---------+-----------+----------+--------------+ FV Mid   Full                                                        +---------+---------------+---------+-----------+----------+--------------+ FV DistalFull                                                        +---------+---------------+---------+-----------+----------+--------------+ PFV      Full                                                        +---------+---------------+---------+-----------+----------+--------------+ POP      Full           Yes      No                                   +---------+---------------+---------+-----------+----------+--------------+ PTV      Full                                                        +---------+---------------+---------+-----------+----------+--------------+ PERO     Full                                                        +---------+---------------+---------+-----------+----------+--------------+   +---------+---------------+---------+-----------+----------+--------------+ LEFT     CompressibilityPhasicitySpontaneityPropertiesThrombus Aging +---------+---------------+---------+-----------+----------+--------------+ CFV      Full           Yes      No                                  +---------+---------------+---------+-----------+----------+--------------+ SFJ      Full                                                        +---------+---------------+---------+-----------+----------+--------------+  FV Prox  Full                                                        +---------+---------------+---------+-----------+----------+--------------+ FV Mid   Full                                                        +---------+---------------+---------+-----------+----------+--------------+ FV DistalFull                                                        +---------+---------------+---------+-----------+----------+--------------+ PFV      Full                                                        +---------+---------------+---------+-----------+----------+--------------+ POP      Full           Yes      No                                  +---------+---------------+---------+-----------+----------+--------------+ PTV      Full                                                        +---------+---------------+---------+-----------+----------+--------------+ PERO     Full                                                         +---------+---------------+---------+-----------+----------+--------------+     Summary: RIGHT: - There is no evidence of deep vein thrombosis in the lower extremity.  - No cystic structure found in the popliteal fossa.  LEFT: - There is no evidence of deep vein thrombosis in the lower extremity.  - No cystic structure found in the popliteal fossa.  *See table(s) above for measurements and observations. Electronically signed by Harold Barban MD on 11/14/2021 at 8:54:21 PM.    Final         Scheduled Meds:  atorvastatin  20 mg Oral q1800   metoprolol succinate  75 mg Oral Daily   nicotine  14 mg Transdermal Daily   sodium chloride flush  3 mL Intravenous Q12H   umeclidinium-vilanterol  1 puff Inhalation Daily   Continuous Infusions:  heparin 1,100 Units/hr (11/14/21 2145)     LOS: 2 days   Time spent= 35 mins    Zanayah Shadowens Arsenio Loader, MD [facility]  If 7PM-7AM, please contact night-coverage  11/15/2021, 7:56 AM

## 2021-11-15 NOTE — Progress Notes (Signed)
[place] for Heparin Indication: hx of atrial fibrillation (holding Apixaban)  No Active Allergies  Patient Measurements: Height: 5' 8.5" (174 cm) Weight: 76.8 kg (169 lb 5 oz) IBW/kg (Calculated) : 69.55 Heparin Dosing Weight: total body weight   Vital Signs: Temp: 97.6 F (36.4 C) (05/25 0513) Temp Source: Oral (05/25 0513) BP: 87/56 (05/25 0513) Pulse Rate: 75 (05/25 0513)  Labs: Recent Labs    11/13/21 0347 11/14/21 0357 11/14/21 1949 11/15/21 0603  HGB 14.6 14.0  --  13.9  HCT 44.5 43.0  --  41.3  PLT 101* 107*  --  110*  APTT  --   --  35 114*  HEPARINUNFRC  --   --  >1.10* >1.10*  CREATININE 1.56* 2.46*  --  2.61*     Estimated Creatinine Clearance: 26.7 mL/min (A) (by C-G formula based on SCr of 2.61 mg/dL (H)).   Medical History: Past Medical History:  Diagnosis Date   Alcohol dependence ([place])    Chronic diastolic CHF (congestive heart failure) (HCC)    normal coronary arteries on cath   COPD (chronic obstructive pulmonary disease) (HCC)    Hypertension    Paroxysmal atrial flutter (HCC)    Pulmonary HTN (HCC)    secondary to COPD   Tobacco dependence     Assessment: 19 YOM with PMH of COPD, chronic diastolic CHF, paroxysmal atrial fibrillation on Apixaban, CKD stage III who presented on 5/22 with SOB x 3 weeks. Normal EF on echo in 2021, decreased to 40-45% on 5/23. Patient stopped taking all medications after multiple siblings passed away. He resumed Apixaban a few months ago. Apixaban was resumed on admission. Pharmacy has been consulted on 5/24 to transition him to heparin in anticipation of right heart cath. Last dose of Apixaban 5mg  PO BID on 5/24 at 1014.  -Lower extremity doppler negative for DVT  Today, 11/15/21: -Heparin level > 1.1, falsely elevated as expected due to recent Apixaban use -aPTT 114 seconds, also supratherapeutic -CBC: Hgb WNL, Pltc low but stable at 110K -SCr up to 2.61, CrCl ~ 27  ml/min  -RN confirms heparin running at specified rate. Heparin infusing in left arm, heparin level collected from right arm.  -No bleeding or infusion issues reported per RN  Goal of Therapy:  Heparin level 0.3-0.7 units/ml aPTT: 66-102 seconds Monitor platelets by anticoagulation protocol: Yes   Plan:  -Decrease IV heparin infusion to 950 units/hr -Will use aPTT for dose titration/monitoring until effects of Apixaban on heparin level have diminished -aPTT 8 hours after rate change -Daily CBC, heparin level, aPTT -Monitor closely for signs and symptoms of bleeding -Noted plans to transfer to [facility] for right heart cath    Lindell Spar, PharmD, BCPS Clinical Pharmacist  11/15/2021 8:34 AM

## 2021-11-16 ENCOUNTER — Encounter (HOSPITAL_COMMUNITY): Payer: Medicare Other

## 2021-11-16 ENCOUNTER — Encounter (HOSPITAL_COMMUNITY)
Admission: EM | Disposition: A | Discharge status: Home Health | Payer: Self-pay | Source: Home / Self Care | Attending: Internal Medicine

## 2021-11-16 ENCOUNTER — Inpatient Hospital Stay (HOSPITAL_COMMUNITY): Payer: Medicare Other

## 2021-11-16 ENCOUNTER — Encounter (HOSPITAL_COMMUNITY): Payer: Self-pay | Admitting: Interventional Cardiology

## 2021-11-16 DIAGNOSIS — I4892 Unspecified atrial flutter: Secondary | ICD-10-CM | POA: Diagnosis not present

## 2021-11-16 DIAGNOSIS — I5033 Acute on chronic diastolic (congestive) heart failure: Secondary | ICD-10-CM | POA: Diagnosis not present

## 2021-11-16 HISTORY — PX: RIGHT HEART CATH: CATH118263

## 2021-11-16 LAB — BASIC METABOLIC PANEL
Anion gap: 10 (ref 5–15)
BUN: 47 mg/dL — ABNORMAL HIGH (ref 8–23)
CO2: 29 mmol/L (ref 22–32)
Calcium: 8.8 mg/dL — ABNORMAL LOW (ref 8.9–10.3)
Chloride: 100 mmol/L (ref 98–111)
Creatinine, Ser: 2.31 mg/dL — ABNORMAL HIGH (ref 0.61–1.24)
GFR, Estimated: 30 mL/min — ABNORMAL LOW (ref >60)
Glucose, Bld: 84 mg/dL (ref 70–99)
Potassium: 4.7 mmol/L (ref 3.5–5.1)
Sodium: 139 mmol/L (ref 135–145)

## 2021-11-16 LAB — POCT I-STAT EG7
Acid-Base Excess: 6 mmol/L — ABNORMAL HIGH (ref 0.0–2.0)
Acid-Base Excess: 7 mmol/L — ABNORMAL HIGH (ref 0.0–2.0)
Bicarbonate: 34.7 mmol/L — ABNORMAL HIGH (ref 20.0–28.0)
Bicarbonate: 36.1 mmol/L — ABNORMAL HIGH (ref 20.0–28.0)
Calcium, Ion: 1.19 mmol/L (ref 1.15–1.40)
Calcium, Ion: 1.22 mmol/L (ref 1.15–1.40)
HCT: 47 % (ref 39.0–52.0)
HCT: 47 % (ref 39.0–52.0)
Hemoglobin: 16 g/dL (ref 13.0–17.0)
Hemoglobin: 16 g/dL (ref 13.0–17.0)
O2 Saturation: 67 %
O2 Saturation: 68 %
Potassium: 4.3 mmol/L (ref 3.5–5.1)
Potassium: 4.4 mmol/L (ref 3.5–5.1)
Sodium: 138 mmol/L (ref 135–145)
Sodium: 139 mmol/L (ref 135–145)
TCO2: 37 mmol/L — ABNORMAL HIGH (ref 22–32)
TCO2: 38 mmol/L — ABNORMAL HIGH (ref 22–32)
pCO2, Ven: 64.5 mmHg — ABNORMAL HIGH (ref 44–60)
pCO2, Ven: 66.9 mmHg — ABNORMAL HIGH (ref 44–60)
pH, Ven: 7.338 (ref 7.25–7.43)
pH, Ven: 7.34 (ref 7.25–7.43)
pO2, Ven: 39 mmHg (ref 32–45)
pO2, Ven: 39 mmHg (ref 32–45)

## 2021-11-16 LAB — CBC
HCT: 40.9 % (ref 39.0–52.0)
Hemoglobin: 14.3 g/dL (ref 13.0–17.0)
MCH: 31.8 pg (ref 26.0–34.0)
MCHC: 35 g/dL (ref 30.0–36.0)
MCV: 91.1 fL (ref 80.0–100.0)
Platelets: 100 10*3/uL — ABNORMAL LOW (ref 150–400)
RBC: 4.49 MIL/uL (ref 4.22–5.81)
RDW: 16.7 % — ABNORMAL HIGH (ref 11.5–15.5)
WBC: 4.5 10*3/uL (ref 4.0–10.5)
nRBC: 0 % (ref 0.0–0.2)

## 2021-11-16 LAB — PROTEIN / CREATININE RATIO, URINE
Creatinine, Urine: 125.12 mg/dL
Protein Creatinine Ratio: 0.18 mg/mg{Cre} — ABNORMAL HIGH (ref 0.00–0.15)
Total Protein, Urine: 22 mg/dL

## 2021-11-16 LAB — MAGNESIUM: Magnesium: 1.8 mg/dL (ref 1.7–2.4)

## 2021-11-16 SURGERY — RIGHT HEART CATH
Anesthesia: LOCAL

## 2021-11-16 MED ORDER — HYDRALAZINE HCL 20 MG/ML IJ SOLN: 10.0000 mg | INTRAMUSCULAR | Status: AC | PRN

## 2021-11-16 MED ORDER — SODIUM CHLORIDE 0.9% FLUSH: 3.0000 mL | INTRAVENOUS | Status: DC | PRN

## 2021-11-16 MED ORDER — ONDANSETRON HCL 4 MG/2ML IJ SOLN: 4.0000 mg | Freq: Four times a day (QID) | INTRAMUSCULAR | Status: DC | PRN

## 2021-11-16 MED ORDER — SODIUM CHLORIDE 0.9% FLUSH
3.0000 mL | Freq: Two times a day (BID) | INTRAVENOUS | Status: DC
  Administered 2021-11-16 – 2021-11-18 (×5): 3 mL via INTRAVENOUS

## 2021-11-16 MED ORDER — HEPARIN (PORCINE) 25000 UT/250ML-% IV SOLN
1100.0000 [IU]/h | INTRAVENOUS | Status: DC
Start: 2021-11-16 — End: 2021-11-17
  Administered 2021-11-16: 950 [IU]/h via INTRAVENOUS
  Filled 2021-11-16: qty 250

## 2021-11-16 MED ORDER — LABETALOL HCL 5 MG/ML IV SOLN: 10.0000 mg | INTRAVENOUS | Status: AC | PRN

## 2021-11-16 MED ORDER — LIDOCAINE HCL (PF) 1 % IJ SOLN
INTRAMUSCULAR | Status: DC | PRN
  Administered 2021-11-16: 2 mL via INTRADERMAL

## 2021-11-16 MED ORDER — SODIUM CHLORIDE 0.9 % IV SOLN: INTRAVENOUS | Status: DC

## 2021-11-16 MED ORDER — ACETAMINOPHEN 325 MG PO TABS: 650.0000 mg | ORAL_TABLET | ORAL | Status: DC | PRN

## 2021-11-16 MED ORDER — SODIUM CHLORIDE 0.9% FLUSH: 3.0000 mL | Freq: Two times a day (BID) | INTRAVENOUS | Status: DC

## 2021-11-16 MED ORDER — SODIUM CHLORIDE 0.9 % IV SOLN: 250.0000 mL | INTRAVENOUS | Status: DC | PRN

## 2021-11-16 MED ORDER — HEPARIN (PORCINE) IN NACL 1000-0.9 UT/500ML-% IV SOLN
INTRAVENOUS | Status: DC | PRN
Start: 2021-11-16 — End: 2021-11-16
  Administered 2021-11-16: 500 mL

## 2021-11-16 MED ORDER — SODIUM CHLORIDE 0.9 % IV SOLN
250.0000 mL | INTRAVENOUS | Status: DC | PRN
  Administered 2021-11-16: 250 mL via INTRAVENOUS

## 2021-11-16 SURGICAL SUPPLY — 9 items
CATH BALLN WEDGE 5F 110CM (CATHETERS) ×1 IMPLANT
GUIDEWIRE .025 260CM (WIRE) ×1 IMPLANT
PACK CARDIAC CATHETERIZATION (CUSTOM PROCEDURE TRAY) ×1 IMPLANT
PROTECTION STATION PRESSURIZED (MISCELLANEOUS) ×2
SHEATH GLIDE SLENDER 4/5FR (SHEATH) ×1 IMPLANT
STATION PROTECTION PRESSURIZED (MISCELLANEOUS) IMPLANT
TRANSDUCER W/STOPCOCK (MISCELLANEOUS) ×1 IMPLANT
TUBING ART PRESS 72  MALE/FEM (TUBING) ×1
TUBING ART PRESS 72 MALE/FEM (TUBING) IMPLANT

## 2021-11-16 NOTE — Progress Notes (Signed)
PROGRESS NOTE    Aaron Fuller  [id number] DOB: 01-13-1956 DOA: 11/12/2021 PCP: Aaron Banker, PA   B9 year old with history of COPD, chronic hypoxia/hypercarbic respiratory failure, diastolic CHF, paroxysmal atrial flutter on Eliquis, tobacco dependence, alcohol use in remission, CKD stage III, hep C came to the ED with complaints of shortness of breath.  Unfortunately patient's multiple siblings passed away over the last few months therefore he quit taking all of his medications and was drinking excessively but stopped about 2 months ago.  He did resume his metoprolol and Eliquis but has not taken his Lasix in several months.  In the ED he was noted to have signs of volume overload with lower extremity swelling, orthopnea, hypoxia, elevated BNP and chest x-ray suggestive of slight volume overload.  Patient was started on IV Lasix 40 mg twice daily, his repeat echo showed EF 40%, grade 2 DD, PASP and moderately reduced RV function.  Due to rising creatinine, his Lasix was discontinued.  Due to abnormal echocardiogram, cardiology team was consulted. Recommended [facility] 5/26, therefore ptn will be transferred there and stay thererief    Assessment & Plan:   Principal Problem:   Acute on chronic diastolic CHF (congestive heart failure) (HCC) Active Problems:   Paroxysmal atrial flutter (HCC)   Emphysema of lung (HCC)   CKD (chronic kidney disease) stage 3, GFR 30-59 ml/min (HCC)   Chronic respiratory failure with hypoxia and hypercapnia (HCC)   Thrombocytopenia (HCC)   COPD exacerbation ([place])  Carry forward from 5/26; Pt had already left the floor in transfer  Acute congestive heart failure with preserved ejection fraction, class III.  EF 60%, grade 1 DD Pulmonary hypertension, likely from COPD -He had signs of CHF exacerbation including orthopnea, hypoxia and lower extremity swelling.  BNP elevated.  Echocardiogram in 2021 showed EF 60%, grade 1 DD, elevated PASP and mildly reduced RV  function.  Repeat echocardiogram shows EF 40%, grade 2 DD, elevated PASP and moderately reduced RV function. Likely RHC.  - Lower extremity Dopplers is negative.  Patient has been seen by cardiology team, planning on right heart catheterization on 5/26 and thereafter patient will stay at [facility]. No plans for left heart catheterization until renal function improves.    AKI on Chronic kidney disease stage IIIa -Baseline creatinine 1.4, admission creatinine 1.83.  Creatinine today is 2.46> 2.6. Likely from diuretics Use. Renal US ordered. Nephro consulted as well.  Bladder scan didn't show any retention.    COPD; chronic hypoxic and hypercarbic respiratory failure  - Continue 3 L home nasal cannula, as needed bronchodilators.   Paroxysmal atrial flutter -Continue Toprol-XL, On Heparin Drip. Holding Home Eliquis. On IV Lopressor as needed.   Thrombocytopenia and leukopenia Alcohol dependence -Suspect from chronic alcohol use and hepatitis C.  No evidence of bleeding and active infection.  We will continue to monitor this -States his last alcoholic drink was over 2 months ago.   History of hepatitis C Patient was seen in GI clinic back in August 2022 but never followed up for treatment.  Would recommend following up with GI/infectious disease outpatient again   DVT prophylaxis: On Heparin Drip.  Code Status: Full code     Family Communication: non etoday Disposition Plan:    Transferred to [facility] today for heart cath Consults called:  Cardiology Admission status: Inpatient   Consultants:  Cardiology  Procedures:  US RENAL  Result Date: 11/15/2021 CLINICAL DATA:  Acute kidney injury. EXAM: RENAL / URINARY TRACT ULTRASOUND COMPLETE  COMPARISON:  October 01, 2019. FINDINGS: Right Kidney: Renal measurements: 12.0 x 4.9 x 4.6 cm = volume: 140 mL. Increased echogenicity of renal parenchyma is noted. No mass or hydronephrosis visualized. Left Kidney: Renal measurements: 9.1 x 5.1 x  3.7 cm = volume: 90 mL. Increased echogenicity of renal parenchyma is noted. No mass or hydronephrosis visualized. Bladder: Appears normal for degree of bladder distention. Other: Mild ascites is noted in the abdomen. IMPRESSION: Increased echogenicity of renal parenchyma is noted bilaterally suggesting medical renal disease. Mild left renal atrophy is noted. No hydronephrosis or renal obstruction is noted. Mild ascites. Electronically Signed   By: Aaron Fuller M.D.   On: 11/15/2021 08:49   DG Chest Port 1 View  Result Date: 11/12/2021 CLINICAL DATA:  Shortness of breath EXAM: PORTABLE CHEST 1 VIEW COMPARISON:  10/01/2019 FINDINGS: The heart is slightly enlarged. No focal opacity, pleural effusion, or pneumothorax. Emphysema. IMPRESSION: 1. Hyperinflation with emphysema 2. Mild cardiomegaly. Electronically Signed   By: Aaron Fuller M.D.   On: 11/12/2021 19:17   ECHOCARDIOGRAM COMPLETE  Result Date: 11/13/2021    ECHOCARDIOGRAM REPORT   Patient Name:   Aaron Fuller Date of Exam: 11/13/2021 Medical Rec #:  Aaron Fuller  Height:       68.5 in Accession #:    [id number] [id number] Weight:       162.5 lb Date of Birth:  March 07, 1956  BSA:          1.881 m Patient Age:    66 years   BP:           128/89 mmHg Patient Gender: M          HR:           89 bpm. Exam Location:  Inpatient Procedure: 2D Echo, Cardiac Doppler and Color Doppler Indications:    Cardiomyopathy  History:        Patient has prior history of Echocardiogram examinations, most                 recent 09/28/2019. CHF, COPD; Arrythmias:Tachycardia.  Sonographer:    Aaron Fuller Referring Phys: Aaron Fuller Aaron Fuller IMPRESSIONS  1. Left ventricular ejection fraction, by estimation, is 40 to 45%. The left ventricle has mildly decreased function. The left ventricle demonstrates global hypokinesis. There is severe concentric left ventricular hypertrophy. Left ventricular diastolic  parameters are consistent with Grade II diastolic dysfunction  (pseudonormalization). There is the interventricular septum is flattened in diastole ('D' shaped left ventricle), consistent with right ventricular volume overload.  2. Right ventricular systolic function is moderately reduced. The right ventricular size is moderately enlarged. Mildly increased right ventricular wall thickness. There is moderately elevated pulmonary artery systolic pressure. The estimated right ventricular systolic pressure is [id number] mmHg.  3. Left atrial size was mildly dilated.  4. Right atrial size was severely dilated.  5. A small pericardial effusion is present. No evidence of tamponade.  6. The mitral valve is grossly normal. Trivial mitral valve regurgitation.  7. Tricuspid valve regurgitation is severe.  8. The aortic valve is tricuspid. Aortic valve regurgitation is mild to moderate.  9. Pulmonic valve regurgitation is moderate. 10. Moderately dilated pulmonary artery. 11. The inferior vena cava is dilated in size with <50% respiratory variability, suggesting right atrial pressure of 15 mmHg. Comparison(s): Significant changes: worsening LVEF with torrential, functional tricuspid regurgitation. Conclusion(s)/Recommendation(s): Notable hypertrophy, atrial dilation, and effusion: amyloid nuclear imaging may be indicated as outpatient. FINDINGS  Left Ventricle: Left ventricular ejection fraction, by estimation,  is 40 to 45%. The left ventricle has mildly decreased function. The left ventricle demonstrates global hypokinesis. The left ventricular internal cavity size was small. There is severe concentric left ventricular hypertrophy. The interventricular septum is flattened in diastole ('D' shaped left ventricle), consistent with right ventricular volume overload. Left ventricular diastolic parameters are consistent with Grade II diastolic dysfunction (pseudonormalization). Right Ventricle: The right ventricular size is moderately enlarged. Mildly increased right ventricular wall thickness. Right  ventricular systolic function is moderately reduced. There is moderately elevated pulmonary artery systolic pressure. The tricuspid regurgitant velocity is 3.03 m/s, and with an assumed right atrial pressure of 15 mmHg, the estimated right ventricular systolic pressure is [id number] mmHg. Left Atrium: Left atrial size was mildly dilated. Right Atrium: Right atrial size was severely dilated. Pericardium: A small pericardial effusion is present. Mitral Valve: The mitral valve is grossly normal. Trivial mitral valve regurgitation. Tricuspid Valve: The tricuspid valve is normal in structure. Tricuspid valve regurgitation is severe. Aortic Valve: The aortic valve is tricuspid. Aortic valve regurgitation is mild to moderate. Aortic regurgitation PHT measures 667 msec. Aortic valve peak gradient measures 8.1 mmHg. Pulmonic Valve: The pulmonic valve was normal in structure. Pulmonic valve regurgitation is moderate. No evidence of pulmonic stenosis. Aorta: The aortic root and ascending aorta are structurally normal, with no evidence of dilitation. Pulmonary Artery: The pulmonary artery is moderately dilated. Venous: The inferior vena cava is dilated in size with less than 50% respiratory variability, suggesting right atrial pressure of 15 mmHg. IAS/Shunts: There is right bowing of the interatrial septum, suggestive of elevated left atrial pressure. No atrial level shunt detected by color flow Doppler.  LEFT VENTRICLE PLAX 2D LVIDd:         3.15 cm      Diastology LVIDs:         2.60 cm      LV e' medial:    7.54 cm/s LV PW:         2.05 cm      LV E/e' medial:  4.5 LV IVS:        1.82 cm      LV e' lateral:   7.51 cm/s LVOT diam:     1.80 cm      LV E/e' lateral: 4.5 LV SV:         55 LV SV Index:   29 LVOT Area:     2.54 cm  LV Volumes (MOD) LV vol d, MOD A4C: 119.0 ml LV vol s, MOD A4C: 84.2 ml LV SV MOD A4C:     119.0 ml RIGHT VENTRICLE          IVC RV Basal diam:  4.40 cm  IVC diam: 3.10 cm RV Mid diam:    5.00 cm TAPSE  (M-mode): 1.6 cm LEFT ATRIUM             Index        RIGHT ATRIUM           Index LA diam:        4.40 cm 2.34 cm/m   RA Area:     30.60 cm LA Vol (A2C):   69.3 ml 36.84 ml/m  RA Volume:   131.00 ml 69.64 ml/m LA Vol (A4C):   47.7 ml 25.36 ml/m LA Biplane Vol: 60.0 ml 31.89 ml/m  AORTIC VALVE                 PULMONIC VALVE AV Area (Vmax): 2.51 cm  PV Vmax:       0.90 m/s AV Vmax:        142.00 cm/s  PV Peak grad:  3.2 mmHg AV Peak Grad:   8.1 mmHg LVOT Vmax:      140.00 cm/s LVOT Vmean:     79.400 cm/s LVOT VTI:       0.218 m AI PHT:         667 msec  AORTA Ao Asc diam: 3.50 cm MITRAL VALVE               TRICUSPID VALVE MV Area (PHT): 5.16 cm    TR Peak grad:   36.7 mmHg MV Decel Time: 147 msec    TR Vmax:        303.00 cm/s MV E velocity: 33.80 cm/s MV A velocity: 75.00 cm/s  SHUNTS MV E/A ratio:  0.45        Systemic VTI:  0.22 m                            Systemic Diam: 1.80 cm Rudean Haskell MD Electronically signed by Rudean Haskell MD Signature Date/Time: 11/13/2021/12:40:01 PM    Final    VAS [place] LOWER EXTREMITY VENOUS (DVT)  Result Date: 11/14/2021  Lower Venous DVT Study Patient Name:  BREYLAN YENSER  Date of Exam:   11/14/2021 Medical Rec #: Aaron Fuller   Accession #:    [id number] Date of Birth: 03-26-1956   Patient Gender: M Patient Age:   20 years Exam Location:  [facility] Procedure:      VAS [place] LOWER EXTREMITY VENOUS (DVT) Referring Phys: Freda Jackson --------------------------------------------------------------------------------  Indications: Edema.  Risk Factors: None identified. Limitations: Poor ultrasound/tissue interface. Comparison Study: No prior studies. Performing Technologist: Oliver Hum RVT  Examination Guidelines: A complete evaluation includes B-mode imaging, spectral Doppler, color Doppler, and power Doppler as needed of all accessible portions of each vessel. Bilateral testing is considered an integral part of a complete examination. Limited  examinations for reoccurring indications may be performed as noted. The reflux portion of the exam is performed with the patient in reverse Trendelenburg.  +---------+---------------+---------+-----------+----------+--------------+ RIGHT    CompressibilityPhasicitySpontaneityPropertiesThrombus Aging +---------+---------------+---------+-----------+----------+--------------+ CFV      Full           Yes      No                                  +---------+---------------+---------+-----------+----------+--------------+ SFJ      Full                                                        +---------+---------------+---------+-----------+----------+--------------+ FV Prox  Full                                                        +---------+---------------+---------+-----------+----------+--------------+ FV Mid   Full                                                        +---------+---------------+---------+-----------+----------+--------------+  FV DistalFull                                                        +---------+---------------+---------+-----------+----------+--------------+ PFV      Full                                                        +---------+---------------+---------+-----------+----------+--------------+ POP      Full           Yes      No                                  +---------+---------------+---------+-----------+----------+--------------+ PTV      Full                                                        +---------+---------------+---------+-----------+----------+--------------+ PERO     Full                                                        +---------+---------------+---------+-----------+----------+--------------+   +---------+---------------+---------+-----------+----------+--------------+ LEFT     CompressibilityPhasicitySpontaneityPropertiesThrombus Aging  +---------+---------------+---------+-----------+----------+--------------+ CFV      Full           Yes      No                                  +---------+---------------+---------+-----------+----------+--------------+ SFJ      Full                                                        +---------+---------------+---------+-----------+----------+--------------+ FV Prox  Full                                                        +---------+---------------+---------+-----------+----------+--------------+ FV Mid   Full                                                        +---------+---------------+---------+-----------+----------+--------------+ FV DistalFull                                                        +---------+---------------+---------+-----------+----------+--------------+  PFV      Full                                                        +---------+---------------+---------+-----------+----------+--------------+ POP      Full           Yes      No                                  +---------+---------------+---------+-----------+----------+--------------+ PTV      Full                                                        +---------+---------------+---------+-----------+----------+--------------+ PERO     Full                                                        +---------+---------------+---------+-----------+----------+--------------+     Summary: RIGHT: - There is no evidence of deep vein thrombosis in the lower extremity.  - No cystic structure found in the popliteal fossa.  LEFT: - There is no evidence of deep vein thrombosis in the lower extremity.  - No cystic structure found in the popliteal fossa.  *See table(s) above for measurements and observations. Electronically signed by Harold Barban MD on 11/14/2021 at 8:54:21 PM.    Final        Subjective: Patient has already left the floor and transfer to [facility] was not  seen this morning  Objective: Vitals:   11/16/21 0434 11/16/21 0840 11/16/21 0905 11/16/21 0910  BP: 126/82 129/70 (!) 142/81 (!) 142/86  Pulse: 70  73 74  Resp: 20 20 (!) 22 (!) 25  Temp: 98.5 F (36.9 C)     TempSrc: Oral     SpO2: 100% 91% 90% 91%  Weight:      Height:        Intake/Output Summary (Last 24 hours) at 11/16/2021 1112 Last data filed at 11/16/2021 0317 Gross per 24 hour  Intake 530.03 ml  Output 150 ml  Net 380.03 ml   Filed Weights   11/14/21 0500 11/15/21 0500 11/16/21 0433  Weight: 75.5 kg 76.8 kg 75.2 kg    Examination:  As noted patient already left the floor for: And was not examined    Data Reviewed: I have personally reviewed following labs and imaging studies  CBC: Recent Labs  Lab 11/12/21 1838 11/13/21 0347 11/14/21 0357 11/15/21 0603 11/16/21 0439  WBC 3.6* 1.8* 5.4 5.1 4.5  NEUTROABS 1.8 1.7  --   --   --   HGB 15.6 14.6 14.0 13.9 14.3  HCT 47.1 44.5 43.0 41.3 40.9  MCV 94.6 94.7 95.3 93.4 91.1  PLT 110* 101* 107* 110* 123XX123*   Basic Metabolic Panel: Recent Labs  Lab 11/12/21 1838 11/13/21 0347 11/14/21 0357 11/15/21 0603 11/16/21 0439  NA 138 139 138 137 139  K 3.9 4.4 4.5 4.5 4.7  CL 96*  99 99 97* 100  CO2 37* 31 32 32 29  GLUCOSE 133* 131* 103* 91 84  BUN 25* 26* 34* 40* 47*  CREATININE 1.83* 1.56* 2.46* 2.61* 2.31*  CALCIUM 9.0 8.6* 8.6* 8.9 8.8*  MG  --  1.7 1.7 1.6* 1.8   GFR: Estimated Creatinine Clearance: 30.1 mL/min (A) (by C-G formula based on SCr of 2.31 mg/dL (H)). Liver Function Tests: No results for input(s): AST, ALT, ALKPHOS, BILITOT, PROT, ALBUMIN in the last 168 hours. No results for input(s): LIPASE, AMYLASE in the last 168 hours. No results for input(s): AMMONIA in the last 168 hours. Coagulation Profile: No results for input(s): INR, PROTIME in the last 168 hours. Cardiac Enzymes: No results for input(s): CKTOTAL, CKMB, CKMBINDEX, TROPONINI in the last 168 hours. BNP (last 3 results) No  results for input(s): PROBNP in the last 8760 hours. HbA1C: No results for input(s): HGBA1C in the last 72 hours. CBG: No results for input(s): GLUCAP in the last 168 hours. Lipid Profile: No results for input(s): CHOL, HDL, LDLCALC, TRIG, CHOLHDL, LDLDIRECT in the last 72 hours. Thyroid Function Tests: No results for input(s): TSH, T4TOTAL, FREET4, T3FREE, THYROIDAB in the last 72 hours. Anemia Panel: No results for input(s): VITAMINB12, FOLATE, FERRITIN, TIBC, IRON, RETICCTPCT in the last 72 hours. Sepsis Labs: No results for input(s): PROCALCITON, LATICACIDVEN in the last 168 hours.  Recent Results (from the past 240 hour(s))  Resp Panel by RT-PCR (Flu A&B, Covid) Nasopharyngeal Swab     Status: None   Collection Time: 11/12/21  6:38 PM   Specimen: Nasopharyngeal Swab; Nasopharyngeal(NP) swabs in vial transport medium  Result Value Ref Range Status   SARS Coronavirus 2 by RT PCR NEGATIVE NEGATIVE Final    Comment: (NOTE) SARS-CoV-2 target nucleic acids are NOT DETECTED.  The SARS-CoV-2 RNA is generally detectable in upper respiratory specimens during the acute phase of infection. The lowest concentration of SARS-CoV-2 viral copies this assay can detect is 138 copies/mL. A negative result does not preclude SARS-Cov-2 infection and should not be used as the sole basis for treatment or other patient management decisions. A negative result may occur with  improper specimen collection/handling, submission of specimen other than nasopharyngeal swab, presence of viral mutation(s) within the areas targeted by this assay, and inadequate number of viral copies(<138 copies/mL). A negative result must be combined with clinical observations, patient history, and epidemiological information. The expected result is Negative.  Fact Sheet for Patients:  EntrepreneurPulse.com.au  Fact Sheet for Healthcare Providers:  IncredibleEmployment.be  This test is  no t yet approved or cleared by the [place] FDA and  has been authorized for detection and/or diagnosis of SARS-CoV-2 by FDA under an Emergency Use Authorization (EUA). This EUA will remain  in effect (meaning this test can be used) for the duration of the COVID-19 declaration under Section 564(b)(1) of the Act, 21 U.S.C.section 360bbb-3(b)(1), unless the authorization is terminated  or revoked sooner.       Influenza A by PCR NEGATIVE NEGATIVE Final   Influenza B by PCR NEGATIVE NEGATIVE Final    Comment: (NOTE) The Xpert Xpress SARS-CoV-2/FLU/RSV plus assay is intended as an aid in the diagnosis of influenza from Nasopharyngeal swab specimens and should not be used as a sole basis for treatment. Nasal washings and aspirates are unacceptable for Xpert Xpress SARS-CoV-2/FLU/RSV testing.  Fact Sheet for Patients: EntrepreneurPulse.com.au  Fact Sheet for Healthcare Providers: IncredibleEmployment.be  This test is not yet approved or cleared by the [place] FDA  and has been authorized for detection and/or diagnosis of SARS-CoV-2 by FDA under an Emergency Use Authorization (EUA). This EUA will remain in effect (meaning this test can be used) for the duration of the COVID-19 declaration under Section 564(b)(1) of the Act, 21 U.S.C. section 360bbb-3(b)(1), unless the authorization is terminated or revoked.  Performed at [facility], [address], [place], [place] [address]          Radiology Studies: US RENAL  Result Date: 11/15/2021 CLINICAL DATA:  Acute kidney injury. EXAM: RENAL / URINARY TRACT ULTRASOUND COMPLETE COMPARISON:  October 01, 2019. FINDINGS: Right Kidney: Renal measurements: 12.0 x 4.9 x 4.6 cm = volume: 140 mL. Increased echogenicity of renal parenchyma is noted. No mass or hydronephrosis visualized. Left Kidney: Renal measurements: 9.1 x 5.1 x 3.7 cm = volume: 90 mL. Increased echogenicity of renal  parenchyma is noted. No mass or hydronephrosis visualized. Bladder: Appears normal for degree of bladder distention. Other: Mild ascites is noted in the abdomen. IMPRESSION: Increased echogenicity of renal parenchyma is noted bilaterally suggesting medical renal disease. Mild left renal atrophy is noted. No hydronephrosis or renal obstruction is noted. Mild ascites. Electronically Signed   By: Aaron Fuller M.D.   On: 11/15/2021 08:49        Scheduled Meds:  atorvastatin  20 mg Oral q1800   metoprolol succinate  25 mg Oral Daily   nicotine  14 mg Transdermal Daily   sodium chloride flush  3 mL Intravenous Q12H   sodium chloride flush  3 mL Intravenous Q12H   umeclidinium-vilanterol  1 puff Inhalation Daily   Continuous Infusions:  sodium chloride     sodium chloride     heparin 950 Units/hr (11/16/21 0342)     LOS: 3 days    Time spent: 15 min    Nicolette Bang, MD [facility]  If 7PM-7AM, please contact night-coverage  11/16/2021, 11:12 AM

## 2021-11-16 NOTE — Progress Notes (Signed)
Subjective:  only 150 of UOP recorded last 24 hours but looks like missed a shift-  crt down a little-  BUN up-  getting right heart cath today as well as transfer to [facility]- numbers do not demonstrate significant volume overload  Objective Vital signs in last 24 hours: Vitals:   11/16/21 0434 11/16/21 0840 11/16/21 0905 11/16/21 0910  BP: 126/82 129/70 (!) 142/81 (!) 142/86  Pulse: 70  73 74  Resp: 20 20 (!) 22 (!) 25  Temp: 98.5 F (36.9 C)     TempSrc: Oral     SpO2: 100% 91% 90% 91%  Weight:      Height:       Weight change: -1.6 kg  Intake/Output Summary (Last 24 hours) at 11/16/2021 1159 Last data filed at 11/16/2021 [id number] Gross per 24 hour  Intake 530.03 ml  Output 150 ml  Net 380.03 ml    Assessment/Plan: 66 year old BM with significant pulmonary pathology and now new worsening cardiac pathology and also with A on CRF 1.Renal- patient with some baseline CKD- not entirely clear why-  renal ultrasound does show smaller right kidney so question some vascular insufficiency.  Now with A on CRF I think mostly due to the hypotension brought on by diuresis but not necessarily volume depleted.  He has been given moderate dose metoprolol which can lower BP.   U/A shows red and WBC > 50 100 of protein-  will quantify protein and check culture as well-  I have taken the liberty of decreasing his metoprolol-  BP seems much better. I will consider midodrine if BP is still low but it is not.  I would not pursue any invasive studies for renal artery stenosis as risk would like be more than benefit-  I heard that doppler is being done but dont see any data.  There are no indications for dialysis -  hoping if we can keep BP higher-  the better renal perfusion will lead to improvement in renal function -  as above crt down but BUN higher-  no changes today and follow labs and UOP-  hope that right heart cath wont derail [place] 2. Hypertension/volume  - as above -  hypotension is an issue-  have decreased the  metoprolol-  He may still be a little volume overloaded but would not add back diuretics at this time-  if heis crt goes down again we might be able to do -  feel his SOB is also due to his COPD which seems pretty severe     Louis Meckel    Labs: Basic Metabolic Panel: Recent Labs  Lab 11/14/21 0357 11/15/21 0603 11/16/21 0439  NA 138 137 139  K 4.5 4.5 4.7  CL 99 97* 100  CO2 32 32 29  GLUCOSE 103* 91 84  BUN 34* 40* 47*  CREATININE 2.46* 2.61* 2.31*  CALCIUM 8.6* 8.9 8.8*   Liver Function Tests: No results for input(s): AST, ALT, ALKPHOS, BILITOT, PROT, ALBUMIN in the last 168 hours. No results for input(s): LIPASE, AMYLASE in the last 168 hours. No results for input(s): AMMONIA in the last 168 hours. CBC: Recent Labs  Lab 11/12/21 1838 11/13/21 0347 11/14/21 0357 11/15/21 0603 11/16/21 0439  WBC 3.6* 1.8* 5.4 5.1 4.5  NEUTROABS 1.8 1.7  --   --   --   HGB 15.6 14.6 14.0 13.9 14.3  HCT 47.1 44.5 43.0 41.3 40.9  MCV 94.6 94.7 95.3 93.4 91.1  PLT 110*  101* 107* 110* 100*   Cardiac Enzymes: No results for input(s): CKTOTAL, CKMB, CKMBINDEX, TROPONINI in the last 168 hours. CBG: No results for input(s): GLUCAP in the last 168 hours.  Iron Studies: No results for input(s): IRON, TIBC, TRANSFERRIN, FERRITIN in the last 72 hours. Studies/Results: US RENAL  Result Date: 11/15/2021 CLINICAL DATA:  Acute kidney injury. EXAM: RENAL / URINARY TRACT ULTRASOUND COMPLETE COMPARISON:  October 01, 2019. FINDINGS: Right Kidney: Renal measurements: 12.0 x 4.9 x 4.6 cm = volume: 140 mL. Increased echogenicity of renal parenchyma is noted. No mass or hydronephrosis visualized. Left Kidney: Renal measurements: 9.1 x 5.1 x 3.7 cm = volume: 90 mL. Increased echogenicity of renal parenchyma is noted. No mass or hydronephrosis visualized. Bladder: Appears normal for degree of bladder distention. Other: Mild ascites is noted in the abdomen. IMPRESSION: Increased echogenicity of renal  parenchyma is noted bilaterally suggesting medical renal disease. Mild left renal atrophy is noted. No hydronephrosis or renal obstruction is noted. Mild ascites. Electronically Signed   By: Marijo Conception M.D.   On: 11/15/2021 08:49   Medications: Infusions:  sodium chloride     sodium chloride     heparin 950 Units/hr (11/16/21 0342)    Scheduled Medications:  atorvastatin  20 mg Oral q1800   metoprolol succinate  25 mg Oral Daily   nicotine  14 mg Transdermal Daily   sodium chloride flush  3 mL Intravenous Q12H   sodium chloride flush  3 mL Intravenous Q12H   umeclidinium-vilanterol  1 puff Inhalation Daily    have reviewed scheduled and prn medications.  Physical Exam: General:  alert NAD-  strong smell of urine in room  Heart:RRR Lungs: poor air movement Abdomen: soft, non tender Extremities: min LE edema   11/16/2021,11:59 AM  LOS: 3 days

## 2021-11-16 NOTE — Progress Notes (Signed)
[facility] here for transport at 0745 this shift for [facility]HC at [facility] Pt in no acute distress prior to transport.

## 2021-11-16 NOTE — Progress Notes (Addendum)
[place] for Heparin Indication: hx of atrial fibrillation (holding Apixaban)  No Active Allergies  Patient Measurements: Height: 5' 8.5" (174 cm) Weight: 75.2 kg (165 lb 12.6 oz) IBW/kg (Calculated) : 69.55 Heparin Dosing Weight: total body weight   Vital Signs: Temp: 98.5 F (36.9 C) (05/26 1253) Temp Source: Oral (05/26 1253) BP: 145/94 (05/26 1253) Pulse Rate: 73 (05/26 1253)  Labs: Recent Labs    11/14/21 0357 11/14/21 1949 11/15/21 0603 11/15/21 1700 11/16/21 0439  HGB 14.0  --  13.9  --  14.3  HCT 43.0  --  41.3  --  40.9  PLT 107*  --  110*  --  100*  APTT  --  35 114* 68*  --   HEPARINUNFRC  --  >1.10* >1.10*  --   --   CREATININE 2.46*  --  2.61*  --  2.31*     Estimated Creatinine Clearance: 30.1 mL/min (A) (by C-G formula based on SCr of 2.31 mg/dL (H)).   Medical History: Past Medical History:  Diagnosis Date   Alcohol dependence ([place])    Chronic diastolic CHF (congestive heart failure) (HCC)    normal coronary arteries on cath   COPD (chronic obstructive pulmonary disease) (HCC)    Hypertension    Paroxysmal atrial flutter (HCC)    Pulmonary HTN (HCC)    secondary to COPD   Tobacco dependence     Assessment: 2 YOM with PMH of COPD, chronic diastolic CHF, paroxysmal atrial fibrillation on Apixaban, CKD stage III who presented on 5/22 with SOB x 3 weeks. Normal EF on echo in 2021, decreased to 40-45% on 5/23. Patient stopped taking all medications after multiple siblings passed away. He resumed Apixaban a few months ago. Apixaban was resumed on admission. Pharmacy has been consulted on 5/24 to transition him to heparin in anticipation of right heart cath. Last dose of Apixaban 5mg  PO BID on 5/24 at 1014.  He is now s/p RHC and heparin to resume (may resume 1 hr post sheath removal; brachial vein)   Goal of Therapy:  Heparin level 0.3-0.7 units/ml aPTT: 66-102 seconds Monitor platelets by anticoagulation  protocol: Yes   Plan:  -Restart heparin infusion 950 units/hr  -aPTT in 8 hrs -Will follow oral anticoagulation plans  Hildred Laser, PharmD Clinical Pharmacist **Pharmacist phone directory can now be found on [email or web address] ([email or web address]).  Listed under [place].

## 2021-11-16 NOTE — Progress Notes (Signed)
Heparin gtt restarted at 2058 at ordered rate of 9.3ml/hr per pharmacy

## 2021-11-16 NOTE — Progress Notes (Signed)
Received patient from [facility] via [facility].  Pt alert and oriented  X4, skin warm and dry, resp slightly labored with 3 L of O2. IVF of NS infusing with Heparin.  No complaint of pain or discomfort at this time. Consent signed and patient placed on monitor.  Call bell in reach.

## 2021-11-16 NOTE — Progress Notes (Signed)
Pt admitted to 6E AxOx4, VS wnL and as per flow. Pt oriented to 6E processes. Pt familiar with the [facility] system. (R) brachial C/D/I. All questions and concerns addressed. Call bell placed within reach, will continue to monitor and maintain safety.

## 2021-11-16 NOTE — H&P (View-Only) (Signed)
Progress Note  Patient Name: Aaron Fuller Date of Encounter: 11/16/2021  [facility] Cardiologist: Fransico Him, MD   Subjective   For right heart cath at [facility] with Dr Tamala Julian today Cr slightly improved   Inpatient Medications    Scheduled Meds:  atorvastatin  20 mg Oral q1800   metoprolol succinate  25 mg Oral Daily   nicotine  14 mg Transdermal Daily   sodium chloride flush  3 mL Intravenous Q12H   sodium chloride flush  3 mL Intravenous Q12H   umeclidinium-vilanterol  1 puff Inhalation Daily   Continuous Infusions:  sodium chloride     sodium chloride     heparin 950 Units/hr (11/16/21 0342)   PRN Meds: sodium chloride, acetaminophen **OR** acetaminophen, guaiFENesin, hydrALAZINE, ipratropium-albuterol, metoprolol tartrate, ondansetron **OR** ondansetron (ZOFRAN) IV, senna-docusate, sodium chloride flush, traZODone   Vital Signs    Vitals:   11/16/21 0434 11/16/21 0840 11/16/21 0905 11/16/21 0910  BP: 126/82 129/70 (!) 142/81 (!) 142/86  Pulse: 70  73 74  Resp: 20 20 (!) 22 (!) 25  Temp: 98.5 F (36.9 C)     TempSrc: Oral     SpO2: 100% 91% 90% 91%  Weight:      Height:        Intake/Output Summary (Last 24 hours) at 11/16/2021 0916 Last data filed at 11/16/2021 0317 Gross per 24 hour  Intake 669.98 ml  Output 150 ml  Net 519.98 ml      11/16/2021    4:33 AM 11/15/2021    5:00 AM 11/14/2021    5:00 AM  Last 3 Weights  Weight (lbs) 165 lb 12.6 oz 169 lb 5 oz 166 lb 7.2 oz  Weight (kg) 75.2 kg 76.8 kg 75.5 kg      Telemetry    NSR without significant ventricular ectopy - Personally Reviewed  ECG    NSR without poor R wave progression in the anterior leads - Personally Reviewed  Physical Exam   GEN: No acute distress.   Neck: No JVD Cardiac:JVP elevated RV lift   Respiratory: Clear to auscultation bilaterally. GI: Soft, nontender, non-distended  MS: No edema; No deformity. Neuro:  Nonfocal  Psych: Normal affect   Labs    High Sensitivity  Troponin:  No results for input(s): TROPONINIHS in the last 720 hours.   Chemistry Recent Labs  Lab 11/14/21 0357 11/15/21 0603 11/16/21 0439  NA 138 137 139  K 4.5 4.5 4.7  CL 99 97* 100  CO2 32 32 29  GLUCOSE 103* 91 84  BUN 34* 40* 47*  CREATININE 2.46* 2.61* 2.31*  CALCIUM 8.6* 8.9 8.8*  MG 1.7 1.6* 1.8  GFRNONAA 28* 26* 30*  ANIONGAP 7 8 10     Lipids No results for input(s): CHOL, TRIG, HDL, LABVLDL, LDLCALC, CHOLHDL in the last 168 hours.  Hematology Recent Labs  Lab 11/14/21 0357 11/15/21 0603 11/16/21 0439  WBC 5.4 5.1 4.5  RBC 4.51 4.42 4.49  HGB 14.0 13.9 14.3  HCT 43.0 41.3 40.9  MCV 95.3 93.4 91.1  MCH 31.0 31.4 31.8  MCHC 32.6 33.7 35.0  RDW 17.1* 17.1* 16.7*  PLT 107* 110* 100*   Thyroid No results for input(s): TSH, FREET4 in the last 168 hours.  BNP Recent Labs  Lab 11/12/21 1838  BNP 678.8*    DDimer No results for input(s): DDIMER in the last 168 hours.   Radiology    US RENAL  Result Date: 11/15/2021 CLINICAL DATA:  Acute kidney injury.  EXAM: RENAL / URINARY TRACT ULTRASOUND COMPLETE COMPARISON:  October 01, 2019. FINDINGS: Right Kidney: Renal measurements: 12.0 x 4.9 x 4.6 cm = volume: 140 mL. Increased echogenicity of renal parenchyma is noted. No mass or hydronephrosis visualized. Left Kidney: Renal measurements: 9.1 x 5.1 x 3.7 cm = volume: 90 mL. Increased echogenicity of renal parenchyma is noted. No mass or hydronephrosis visualized. Bladder: Appears normal for degree of bladder distention. Other: Mild ascites is noted in the abdomen. IMPRESSION: Increased echogenicity of renal parenchyma is noted bilaterally suggesting medical renal disease. Mild left renal atrophy is noted. No hydronephrosis or renal obstruction is noted. Mild ascites. Electronically Signed   By: Marijo Conception M.D.   On: 11/15/2021 08:49   VAS [place] LOWER EXTREMITY VENOUS (DVT)  Result Date: 11/14/2021  Lower Venous DVT Study Patient Name:  Aaron Fuller  Date of Exam:    11/14/2021 Medical Rec #: [id number]   Accession #:    [id number] Date of Birth: 1956-04-02   Patient Gender: M Patient Age:   66 years Exam Location:  [facility] Procedure:      VAS [place] LOWER EXTREMITY VENOUS (DVT) Referring Phys: Freda Jackson --------------------------------------------------------------------------------  Indications: Edema.  Risk Factors: None identified. Limitations: Poor ultrasound/tissue interface. Comparison Study: No prior studies. Performing Technologist: Oliver Hum RVT  Examination Guidelines: A complete evaluation includes B-mode imaging, spectral Doppler, color Doppler, and power Doppler as needed of all accessible portions of each vessel. Bilateral testing is considered an integral part of a complete examination. Limited examinations for reoccurring indications may be performed as noted. The reflux portion of the exam is performed with the patient in reverse Trendelenburg.  +---------+---------------+---------+-----------+----------+--------------+ RIGHT    CompressibilityPhasicitySpontaneityPropertiesThrombus Aging +---------+---------------+---------+-----------+----------+--------------+ CFV      Full           Yes      No                                  +---------+---------------+---------+-----------+----------+--------------+ SFJ      Full                                                        +---------+---------------+---------+-----------+----------+--------------+ FV Prox  Full                                                        +---------+---------------+---------+-----------+----------+--------------+ FV Mid   Full                                                        +---------+---------------+---------+-----------+----------+--------------+ FV DistalFull                                                        +---------+---------------+---------+-----------+----------+--------------+ PFV  Full                                                         +---------+---------------+---------+-----------+----------+--------------+ POP      Full           Yes      No                                  +---------+---------------+---------+-----------+----------+--------------+ PTV      Full                                                        +---------+---------------+---------+-----------+----------+--------------+ PERO     Full                                                        +---------+---------------+---------+-----------+----------+--------------+   +---------+---------------+---------+-----------+----------+--------------+ LEFT     CompressibilityPhasicitySpontaneityPropertiesThrombus Aging +---------+---------------+---------+-----------+----------+--------------+ CFV      Full           Yes      No                                  +---------+---------------+---------+-----------+----------+--------------+ SFJ      Full                                                        +---------+---------------+---------+-----------+----------+--------------+ FV Prox  Full                                                        +---------+---------------+---------+-----------+----------+--------------+ FV Mid   Full                                                        +---------+---------------+---------+-----------+----------+--------------+ FV DistalFull                                                        +---------+---------------+---------+-----------+----------+--------------+ PFV      Full                                                        +---------+---------------+---------+-----------+----------+--------------+  POP      Full           Yes      No                                  +---------+---------------+---------+-----------+----------+--------------+ PTV      Full                                                         +---------+---------------+---------+-----------+----------+--------------+ PERO     Full                                                        +---------+---------------+---------+-----------+----------+--------------+     Summary: RIGHT: - There is no evidence of deep vein thrombosis in the lower extremity.  - No cystic structure found in the popliteal fossa.  LEFT: - There is no evidence of deep vein thrombosis in the lower extremity.  - No cystic structure found in the popliteal fossa.  *See table(s) above for measurements and observations. Electronically signed by Harold Barban MD on 11/14/2021 at 8:54:21 PM.    Final     Cardiac Studies   Echo 09/28/2019 1. Left ventricular ejection fraction, by estimation, is 55 to 60%. The  left ventricle has normal function. The left ventricle has no regional  wall motion abnormalities. There is mild concentric left ventricular  hypertrophy. Left ventricular diastolic  parameters are consistent with Grade I diastolic dysfunction (impaired  relaxation).   2. Right ventricular systolic function is moderately reduced. The right  ventricular size is moderately enlarged. There is moderately elevated  pulmonary artery systolic pressure. The estimated right ventricular  systolic pressure is [id number] mmHg.   3. Right atrial size was moderately dilated.   4. The mitral valve is normal in structure. No evidence of mitral valve  regurgitation.   5. The aortic valve is tricuspid. Aortic valve regurgitation is moderate.  No aortic stenosis is present.   6. The inferior vena cava is dilated in size with <50% respiratory  variability, suggesting right atrial pressure of 15 mmHg.     Cath 09/29/2019 Hemodynamic findings consistent with moderate pulmonary hypertension.   Moderately elevated right heart pressures with PA pressure 63/28 and mean pressure 39 mmHg consistent with at least moderate pulmonary hypertension.   PVR: 2.9 WU   LVEDP 17 - 18 mmHg.    Normal epicardial coronary arteries.   RECOMMENDATION: Optimization of fluid status, blood pressure, and cardiac rhythm in this patient diastolic dysfunction who presented with SVT/atrial flutter.  Smoking cessation is essential.     Echo 11/13/2021 1. Left ventricular ejection fraction, by estimation, is 40 to 45%. The  left ventricle has mildly decreased function. The left ventricle  demonstrates global hypokinesis. There is severe concentric left  ventricular hypertrophy. Left ventricular diastolic   parameters are consistent with Grade II diastolic dysfunction  (pseudonormalization). There is the interventricular septum is flattened  in diastole ('D' shaped left ventricle), consistent with right ventricular  volume overload.   2. Right ventricular systolic function is moderately reduced. The right  ventricular size  is moderately enlarged. Mildly increased right  ventricular wall thickness. There is moderately elevated pulmonary artery  systolic pressure. The estimated right  ventricular systolic pressure is [id number] mmHg.   3. Left atrial size was mildly dilated.   4. Right atrial size was severely dilated.   5. A small pericardial effusion is present. No evidence of tamponade.   6. The mitral valve is grossly normal. Trivial mitral valve  regurgitation.   7. Tricuspid valve regurgitation is severe.   8. The aortic valve is tricuspid. Aortic valve regurgitation is mild to  moderate.   9. Pulmonic valve regurgitation is moderate.  10. Moderately dilated pulmonary artery.  11. The inferior vena cava is dilated in size with <50% respiratory  variability, suggesting right atrial pressure of 15 mmHg.   Comparison(s): Significant changes: worsening LVEF with torrential,  functional tricuspid regurgitation.   Conclusion(s)/Recommendation(s): Notable hypertrophy, atrial dilation, and  effusion: amyloid nuclear imaging may be indicated as outpatient.   Patient Profile     66 y.o.  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 Reesa Chew.  Assessment & Plan    Acute on chronic combined systolic and diastolic heart failure:   His RV is horrible with severe MR and WHO class 2/3 pulmonary HTN. For right heart today would keep at [facility] and have CHF team see given azotemia Milrinone for RV function may be useful until Cr gets to baseline               Acute on chronic renal insufficiency: Creatinine 1.83-->1.56-->2.46->2.61->2.3  Likely ATN from hypotension on top of CRF and low output from RV failure Nephrology is seeing Guide diuretic Rx based on PCWP at cath today     EtOH and tobacco use: He smokes half a pack per day.  Per patient, he previously managed to quit drinking for many years, however recently resumed alcohol after the passing away of multiple siblings   Paroxysmal atrial flutter: On Eliquis and metoprolol succinate at home transition back to [place] post cath    Hypertension   History of pulmonary hypertension   Severe COPD: Followed by pulmonology service.     For questions or updates, please contact [place] Please consult [email or web address] for contact info under        Signed, Jenkins Rouge, MD  11/16/2021, 9:16 AM

## 2021-11-16 NOTE — TOC Progression Note (Signed)
Transition of Care [facility]) - Progression Note    Patient Details  Name: Aaron Fuller MRN: [id number] Date of Birth: 09/25/1955  [facility] CM/SW Contact  Golda Acre, RN Phone Number: 11/16/2021, 8:06 AM  Clinical Narrative:    [place] for toc needs plan is to return home.   Expected Discharge Plan: Home w Home Health Services Barriers to Discharge: Continued Medical Work up  Expected Discharge Plan and Services Expected Discharge Plan: Home w Home Health Services   Discharge Planning Services: CM Consult Post Acute Care Choice: Home Health Living arrangements for the past 2 months: Single Family Home                           HH Arranged: Disease Management HH Agency: [facility] Date [facility] Agency Contacted: 11/13/21 Time HH Agency Contacted: [phone] Representative spoke with at [facility] Agency: [email or web address]   Social Determinants of Health (SDOH) Interventions    Readmission Risk Interventions     View : No data to display.

## 2021-11-16 NOTE — Progress Notes (Signed)
Elevated PROGRESS NOTE    Aaron Fuller  [id number] DOB: 22-Apr-1956 DOA: 11/12/2021 PCP: Wynn Banker, PA   Brief Narrative:  66 year old with history of COPD with chronic hypoxic respiratory failure on 3 L of oxygen at baseline, HFpEF, PAF, hepatitis C-who presented shortness of breath and lower extremity edema-he was initially thought to have acute on chronic HFpEF, however echocardiogram showed significant RV dysfunction-he was subsequently transferred to [facility] for [place].  See below for further details.  Significant events: 5/22>> referred to [facility] ED by PCP for worsening shortness of breath/hypoxemia-significant volume overload-admit to [facility]. 5/26>> transfer to [facility] for RHC.  Significant studies: 5/22>> CXR: No PNA 5/22>> renal ultrasound: No hydronephrosis-increased echogenicity noted bilaterally 5/23>> Echo: EF [id number], grade 2 diastolic dysfunction, f RV systolic function moderately reduced, RV systolic pressure [id number].  Significant microbiology data: 5/22>> COVID/influenza PCR: Negative  Procedures: 5/26>> RHC: Mean PA pressure 38 mmHg Mean wedge pressure 13 mmHg Pulmonary vascular resistance 4.8 Woods units Elevated mean right atrial pressure =16 mmHg. PAPI = 2.8  Consults: PCCM, cardiology, nephrology  Subjective: Lying comfortably in bed-denies any shortness of breath at rest.  No chest pain.  Objective: Vitals: Blood pressure (!) 145/94, pulse 73, temperature 98.5 F (36.9 C), temperature source Oral, resp. rate (!) 21, height 5' 8.5" (1.74 m), weight 75.2 kg, SpO2 93 %.   Exam: Gen Exam:Alert awake-not in any distress HEENT:atraumatic, normocephalic Chest: B/L clear to auscultation anteriorly CVS:S1S2 regular Abdomen:soft non tender, non distended Extremities:+ edema Neurology: Non focal Skin: no rash   Assessment & Plan: Acute on chronic biventricular heart failure: Volume status gradually improving-minimal lower extremity edema today-RHC as  above-cardiology/nephrology following and directing diuretic regimen.  Cardiology contemplating advanced CHF team consultation for possible inotropic support.  AKI on CKD stage IIIa: Likely hemodynamically mediated-in the setting of biventricular heart failure-avoid nephrotoxic agents-nephrology following-inotropes being contemplated by cardiology.  COPD with chronic hypoxic respiratory failure-with acute on chronic cor pulmonale: Diuretic regimen per cardiology-continue bronchodilators.  Stable on 3 L of oxygen.    Paroxysmal atrial flutter: Continue beta-blocker-on IV heparin.  Continue telemetry monitoring.  HCV: Defer further to the outpatient setting.  Mild thrombocytopenia: Likely related to history of EtOH use/HCV.  Watch closely.  EtOH use: Patient's multiple siblings passed away several months back-he apparently quit taking his medications and drank quite a bit of alcohol-he apparently has been abstinent for 2 months.  No signs of withdrawal-watch closely.  Tobacco abuse: On transdermal nicotine-counseled  DVT prophylaxis: On Heparin Drip.  Code Status: Full code Family Communication:  None at bedside   Data Reviewed:   CBC: Recent Labs  Lab 11/12/21 1838 11/13/21 0347 11/14/21 0357 11/15/21 0603 11/16/21 0439  WBC 3.6* 1.8* 5.4 5.1 4.5  NEUTROABS 1.8 1.7  --   --   --   HGB 15.6 14.6 14.0 13.9 14.3  HCT 47.1 44.5 43.0 41.3 40.9  MCV 94.6 94.7 95.3 93.4 91.1  PLT 110* 101* 107* 110* 100*    Basic Metabolic Panel: Recent Labs  Lab 11/12/21 1838 11/13/21 0347 11/14/21 0357 11/15/21 0603 11/16/21 0439  NA 138 139 138 137 139  K 3.9 4.4 4.5 4.5 4.7  CL 96* 99 99 97* 100  CO2 37* 31 32 32 29  GLUCOSE 133* 131* 103* 91 84  BUN 25* 26* 34* 40* 47*  CREATININE 1.83* 1.56* 2.46* 2.61* 2.31*  CALCIUM 9.0 8.6* 8.6* 8.9 8.8*  MG  --  1.7 1.7 1.6* 1.8    GFR: Estimated Creatinine  Clearance: 30.1 mL/min (A) (by C-G formula based on SCr of 2.31 mg/dL (H)). Liver  Function Tests: No results for input(s): AST, ALT, ALKPHOS, BILITOT, PROT, ALBUMIN in the last 168 hours. No results for input(s): LIPASE, AMYLASE in the last 168 hours. No results for input(s): AMMONIA in the last 168 hours. Coagulation Profile: No results for input(s): INR, PROTIME in the last 168 hours. Cardiac Enzymes: No results for input(s): CKTOTAL, CKMB, CKMBINDEX, TROPONINI in the last 168 hours. BNP (last 3 results) No results for input(s): PROBNP in the last 8760 hours. HbA1C: No results for input(s): HGBA1C in the last 72 hours. CBG: No results for input(s): GLUCAP in the last 168 hours. Lipid Profile: No results for input(s): CHOL, HDL, LDLCALC, TRIG, CHOLHDL, LDLDIRECT in the last 72 hours. Thyroid Function Tests: No results for input(s): TSH, T4TOTAL, FREET4, T3FREE, THYROIDAB in the last 72 hours. Anemia Panel: No results for input(s): VITAMINB12, FOLATE, FERRITIN, TIBC, IRON, RETICCTPCT in the last 72 hours. Sepsis Labs: No results for input(s): PROCALCITON, LATICACIDVEN in the last 168 hours.  Recent Results (from the past 240 hour(s))  Resp Panel by RT-PCR (Flu A&B, Covid) Nasopharyngeal Swab     Status: None   Collection Time: 11/12/21  6:38 PM   Specimen: Nasopharyngeal Swab; Nasopharyngeal(NP) swabs in vial transport medium  Result Value Ref Range Status   SARS Coronavirus 2 by RT PCR NEGATIVE NEGATIVE Final    Comment: (NOTE) SARS-CoV-2 target nucleic acids are NOT DETECTED.  The SARS-CoV-2 RNA is generally detectable in upper respiratory specimens during the acute phase of infection. The lowest concentration of SARS-CoV-2 viral copies this assay can detect is 138 copies/mL. A negative result does not preclude SARS-Cov-2 infection and should not be used as the sole basis for treatment or other patient management decisions. A negative result may occur with  improper specimen collection/handling, submission of specimen other than nasopharyngeal swab,  presence of viral mutation(s) within the areas targeted by this assay, and inadequate number of viral copies(<138 copies/mL). A negative result must be combined with clinical observations, patient history, and epidemiological information. The expected result is Negative.  Fact Sheet for Patients:  EntrepreneurPulse.com.au  Fact Sheet for Healthcare Providers:  IncredibleEmployment.be  This test is no t yet approved or cleared by the [place] FDA and  has been authorized for detection and/or diagnosis of SARS-CoV-2 by FDA under an Emergency Use Authorization (EUA). This EUA will remain  in effect (meaning this test can be used) for the duration of the COVID-19 declaration under Section 564(b)(1) of the Act, 21 U.S.C.section 360bbb-3(b)(1), unless the authorization is terminated  or revoked sooner.       Influenza A by PCR NEGATIVE NEGATIVE Final   Influenza B by PCR NEGATIVE NEGATIVE Final    Comment: (NOTE) The Xpert Xpress SARS-CoV-2/FLU/RSV plus assay is intended as an aid in the diagnosis of influenza from Nasopharyngeal swab specimens and should not be used as a sole basis for treatment. Nasal washings and aspirates are unacceptable for Xpert Xpress SARS-CoV-2/FLU/RSV testing.  Fact Sheet for Patients: EntrepreneurPulse.com.au  Fact Sheet for Healthcare Providers: IncredibleEmployment.be  This test is not yet approved or cleared by the [place] FDA and has been authorized for detection and/or diagnosis of SARS-CoV-2 by FDA under an Emergency Use Authorization (EUA). This EUA will remain in effect (meaning this test can be used) for the duration of the COVID-19 declaration under Section 564(b)(1) of the Act, 21 U.S.C. section 360bbb-3(b)(1), unless the authorization is terminated or  revoked.  Performed at [facility], [address] [place], [place] [address]            Radiology Studies: CARDIAC CATHETERIZATION  Result Date: 11/16/2021   Hemodynamic findings consistent with moderate pulmonary hypertension. 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 Pulmonary vascular resistance 4.8 Woods units Mean right atrial pressure 16, PAPI 2.8 Cardiac output 5.2 L/min Cardiac index 2.74 L/min/m Mixed venous/main pulmonary artery O2 saturation 68% RECOMMENDATIONS: Plan Per treating team.  Right atrial pressures are elevated.  Left heart hemodynamics suggest compensated heart failure at this point.  US RENAL  Result Date: 11/15/2021 CLINICAL DATA:  Acute kidney injury. EXAM: RENAL / URINARY TRACT ULTRASOUND COMPLETE COMPARISON:  October 01, 2019. FINDINGS: Right Kidney: Renal measurements: 12.0 x 4.9 x 4.6 cm = volume: 140 mL. Increased echogenicity of renal parenchyma is noted. No mass or hydronephrosis visualized. Left Kidney: Renal measurements: 9.1 x 5.1 x 3.7 cm = volume: 90 mL. Increased echogenicity of renal parenchyma is noted. No mass or hydronephrosis visualized. Bladder: Appears normal for degree of bladder distention. Other: Mild ascites is noted in the abdomen. IMPRESSION: Increased echogenicity of renal parenchyma is noted bilaterally suggesting medical renal disease. Mild left renal atrophy is noted. No hydronephrosis or renal obstruction is noted. Mild ascites. Electronically Signed   By: Marijo Conception M.D.   On: 11/15/2021 08:49        Scheduled Meds:  atorvastatin  20 mg Oral q1800   metoprolol succinate  25 mg Oral Daily   nicotine  14 mg Transdermal Daily   sodium chloride flush  3 mL Intravenous Q12H   sodium chloride flush  3 mL Intravenous Q12H   umeclidinium-vilanterol  1 puff Inhalation Daily   Continuous Infusions:  sodium chloride 250 mL (11/16/21 1257)     LOS: 3 days   Time spent= 35 mins  Oren Binet, MD [facility]  If 7PM-7AM, please contact  night-coverage  11/16/2021, 1:24 PM

## 2021-11-16 NOTE — Progress Notes (Signed)
Heparin gtt restarted at 0342 At ordered rate of 9.53ml/hr per pharmacy

## 2021-11-16 NOTE — Interval H&P Note (Signed)
Cath Lab Visit (complete for each Cath Lab visit)  Clinical Evaluation Leading to the Procedure:   ACS: No.  Non-ACS:    Anginal Classification: CCS III  Anti-ischemic medical therapy: No Therapy  Non-Invasive Test Results: No non-invasive testing performed  Prior CABG: No previous CABG      History and Physical Interval Note:  11/16/2021 11:55 AM  Aaron Fuller  has presented today for surgery, with the diagnosis of chf.  The various methods of treatment have been discussed with the patient and family. After consideration of risks, benefits and other options for treatment, the patient has consented to  Procedure(s): RIGHT HEART CATH (N/A) as a surgical intervention.  The patient's history has been reviewed, patient examined, no change in status, stable for surgery.  I have reviewed the patient's chart and labs.  Questions were answered to the patient's satisfaction.     Lyn Records III

## 2021-11-16 NOTE — TOC Progression Note (Signed)
Transition of Care [facility]) - Progression Note    Patient Details  Name: Aaron Fuller MRN: [id number] Date of Birth: 10-May-1956  Transition of Care [facility]) CM/SW Contact  Golda Acre, RN Phone Number: 11/16/2021, 2:09 PM  Clinical Narrative:    [facility]   Expected Discharge Plan: Home w Home Health Services Barriers to Discharge: Continued Medical Work up  Expected Discharge Plan and Services Expected Discharge Plan: Home w Home Health Services   Discharge Planning Services: CM Consult Post Acute Care Choice: Home Health Living arrangements for the past 2 months: Single Family Home                           HH Arranged: Disease Management HH Agency: [facility] Date [facility] Agency Contacted: 11/13/21 Time HH Agency Contacted: [phone] Representative spoke with at [facility] Agency: [email or web address]   Social Determinants of Health (SDOH) Interventions    Readmission Risk Interventions     View : No data to display.

## 2021-11-16 NOTE — CV Procedure (Signed)
Right heart cath completed from right antecubital. Mean PA pressure 38 mmHg Mean wedge pressure 13 mmHg Pulmonary vascular resistance 4.8 Woods units Elevated mean right atrial pressure =16 mmHg. PAPI = 2.8

## 2021-11-16 NOTE — Progress Notes (Signed)
PT pulled out IV trying to get out of bed and into bathroom. This RN tried twice for IV access and was unsuccessful. IV team order placed. Pharmacy made aware heparin gtt is stopped until new IV access

## 2021-11-16 NOTE — Progress Notes (Signed)
Progress Note  Patient Name: Aaron Fuller Date of Encounter: 11/16/2021  [facility] Cardiologist: Fransico Him, MD   Subjective   For right heart cath at [facility] with Dr Tamala Julian today Cr slightly improved   Inpatient Medications    Scheduled Meds:  atorvastatin  20 mg Oral q1800   metoprolol succinate  25 mg Oral Daily   nicotine  14 mg Transdermal Daily   sodium chloride flush  3 mL Intravenous Q12H   sodium chloride flush  3 mL Intravenous Q12H   umeclidinium-vilanterol  1 puff Inhalation Daily   Continuous Infusions:  sodium chloride     sodium chloride     heparin 950 Units/hr (11/16/21 0342)   PRN Meds: sodium chloride, acetaminophen **OR** acetaminophen, guaiFENesin, hydrALAZINE, ipratropium-albuterol, metoprolol tartrate, ondansetron **OR** ondansetron (ZOFRAN) IV, senna-docusate, sodium chloride flush, traZODone   Vital Signs    Vitals:   11/16/21 0434 11/16/21 0840 11/16/21 0905 11/16/21 0910  BP: 126/82 129/70 (!) 142/81 (!) 142/86  Pulse: 70  73 74  Resp: 20 20 (!) 22 (!) 25  Temp: 98.5 F (36.9 C)     TempSrc: Oral     SpO2: 100% 91% 90% 91%  Weight:      Height:        Intake/Output Summary (Last 24 hours) at 11/16/2021 0916 Last data filed at 11/16/2021 0317 Gross per 24 hour  Intake 669.98 ml  Output 150 ml  Net 519.98 ml      11/16/2021    4:33 AM 11/15/2021    5:00 AM 11/14/2021    5:00 AM  Last 3 Weights  Weight (lbs) 165 lb 12.6 oz 169 lb 5 oz 166 lb 7.2 oz  Weight (kg) 75.2 kg 76.8 kg 75.5 kg      Telemetry    NSR without significant ventricular ectopy - Personally Reviewed  ECG    NSR without poor R wave progression in the anterior leads - Personally Reviewed  Physical Exam   GEN: No acute distress.   Neck: No JVD Cardiac:JVP elevated RV lift   Respiratory: Clear to auscultation bilaterally. GI: Soft, nontender, non-distended  MS: No edema; No deformity. Neuro:  Nonfocal  Psych: Normal affect   Labs    High Sensitivity  Troponin:  No results for input(s): TROPONINIHS in the last 720 hours.   Chemistry Recent Labs  Lab 11/14/21 0357 11/15/21 0603 11/16/21 0439  NA 138 137 139  K 4.5 4.5 4.7  CL 99 97* 100  CO2 32 32 29  GLUCOSE 103* 91 84  BUN 34* 40* 47*  CREATININE 2.46* 2.61* 2.31*  CALCIUM 8.6* 8.9 8.8*  MG 1.7 1.6* 1.8  GFRNONAA 28* 26* 30*  ANIONGAP 7 8 10     Lipids No results for input(s): CHOL, TRIG, HDL, LABVLDL, LDLCALC, CHOLHDL in the last 168 hours.  Hematology Recent Labs  Lab 11/14/21 0357 11/15/21 0603 11/16/21 0439  WBC 5.4 5.1 4.5  RBC 4.51 4.42 4.49  HGB 14.0 13.9 14.3  HCT 43.0 41.3 40.9  MCV 95.3 93.4 91.1  MCH 31.0 31.4 31.8  MCHC 32.6 33.7 35.0  RDW 17.1* 17.1* 16.7*  PLT 107* 110* 100*   Thyroid No results for input(s): TSH, FREET4 in the last 168 hours.  BNP Recent Labs  Lab 11/12/21 1838  BNP 678.8*    DDimer No results for input(s): DDIMER in the last 168 hours.   Radiology    US RENAL  Result Date: 11/15/2021 CLINICAL DATA:  Acute kidney injury.  EXAM: RENAL / URINARY TRACT ULTRASOUND COMPLETE COMPARISON:  October 01, 2019. FINDINGS: Right Kidney: Renal measurements: 12.0 x 4.9 x 4.6 cm = volume: 140 mL. Increased echogenicity of renal parenchyma is noted. No mass or hydronephrosis visualized. Left Kidney: Renal measurements: 9.1 x 5.1 x 3.7 cm = volume: 90 mL. Increased echogenicity of renal parenchyma is noted. No mass or hydronephrosis visualized. Bladder: Appears normal for degree of bladder distention. Other: Mild ascites is noted in the abdomen. IMPRESSION: Increased echogenicity of renal parenchyma is noted bilaterally suggesting medical renal disease. Mild left renal atrophy is noted. No hydronephrosis or renal obstruction is noted. Mild ascites. Electronically Signed   By: Marijo Conception M.D.   On: 11/15/2021 08:49   VAS [place] LOWER EXTREMITY VENOUS (DVT)  Result Date: 11/14/2021  Lower Venous DVT Study Patient Name:  Aaron Fuller  Date of Exam:    11/14/2021 Medical Rec #: [id number]   Accession #:    [id number] Date of Birth: [birth year]   Patient Gender: M Patient Age:   25 years Exam Location:  [facility] Procedure:      VAS [place] LOWER EXTREMITY VENOUS (DVT) Referring Phys: Freda Jackson --------------------------------------------------------------------------------  Indications: Edema.  Risk Factors: None identified. Limitations: Poor ultrasound/tissue interface. Comparison Study: No prior studies. Performing Technologist: Oliver Hum RVT  Examination Guidelines: A complete evaluation includes B-mode imaging, spectral Doppler, color Doppler, and power Doppler as needed of all accessible portions of each vessel. Bilateral testing is considered an integral part of a complete examination. Limited examinations for reoccurring indications may be performed as noted. The reflux portion of the exam is performed with the patient in reverse Trendelenburg.  +---------+---------------+---------+-----------+----------+--------------+ RIGHT    CompressibilityPhasicitySpontaneityPropertiesThrombus Aging +---------+---------------+---------+-----------+----------+--------------+ CFV      Full           Yes      No                                  +---------+---------------+---------+-----------+----------+--------------+ SFJ      Full                                                        +---------+---------------+---------+-----------+----------+--------------+ FV Prox  Full                                                        +---------+---------------+---------+-----------+----------+--------------+ FV Mid   Full                                                        +---------+---------------+---------+-----------+----------+--------------+ FV DistalFull                                                        +---------+---------------+---------+-----------+----------+--------------+ PFV  Full                                                         +---------+---------------+---------+-----------+----------+--------------+ POP      Full           Yes      No                                  +---------+---------------+---------+-----------+----------+--------------+ PTV      Full                                                        +---------+---------------+---------+-----------+----------+--------------+ PERO     Full                                                        +---------+---------------+---------+-----------+----------+--------------+   +---------+---------------+---------+-----------+----------+--------------+ LEFT     CompressibilityPhasicitySpontaneityPropertiesThrombus Aging +---------+---------------+---------+-----------+----------+--------------+ CFV      Full           Yes      No                                  +---------+---------------+---------+-----------+----------+--------------+ SFJ      Full                                                        +---------+---------------+---------+-----------+----------+--------------+ FV Prox  Full                                                        +---------+---------------+---------+-----------+----------+--------------+ FV Mid   Full                                                        +---------+---------------+---------+-----------+----------+--------------+ FV DistalFull                                                        +---------+---------------+---------+-----------+----------+--------------+ PFV      Full                                                        +---------+---------------+---------+-----------+----------+--------------+  POP      Full           Yes      No                                  +---------+---------------+---------+-----------+----------+--------------+ PTV      Full                                                         +---------+---------------+---------+-----------+----------+--------------+ PERO     Full                                                        +---------+---------------+---------+-----------+----------+--------------+     Summary: RIGHT: - There is no evidence of deep vein thrombosis in the lower extremity.  - No cystic structure found in the popliteal fossa.  LEFT: - There is no evidence of deep vein thrombosis in the lower extremity.  - No cystic structure found in the popliteal fossa.  *See table(s) above for measurements and observations. Electronically signed by Harold Barban MD on 11/14/2021 at 8:54:21 PM.    Final     Cardiac Studies   Echo 09/28/2019 1. Left ventricular ejection fraction, by estimation, is 55 to 60%. The  left ventricle has normal function. The left ventricle has no regional  wall motion abnormalities. There is mild concentric left ventricular  hypertrophy. Left ventricular diastolic  parameters are consistent with Grade I diastolic dysfunction (impaired  relaxation).   2. Right ventricular systolic function is moderately reduced. The right  ventricular size is moderately enlarged. There is moderately elevated  pulmonary artery systolic pressure. The estimated right ventricular  systolic pressure is [id number] mmHg.   3. Right atrial size was moderately dilated.   4. The mitral valve is normal in structure. No evidence of mitral valve  regurgitation.   5. The aortic valve is tricuspid. Aortic valve regurgitation is moderate.  No aortic stenosis is present.   6. The inferior vena cava is dilated in size with <50% respiratory  variability, suggesting right atrial pressure of 15 mmHg.     Cath 09/29/2019 Hemodynamic findings consistent with moderate pulmonary hypertension.   Moderately elevated right heart pressures with PA pressure 63/28 and mean pressure 39 mmHg consistent with at least moderate pulmonary hypertension.   PVR: 2.9 WU   LVEDP 17 - 18 mmHg.    Normal epicardial coronary arteries.   RECOMMENDATION: Optimization of fluid status, blood pressure, and cardiac rhythm in this patient diastolic dysfunction who presented with SVT/atrial flutter.  Smoking cessation is essential.     Echo 11/13/2021 1. Left ventricular ejection fraction, by estimation, is 40 to 45%. The  left ventricle has mildly decreased function. The left ventricle  demonstrates global hypokinesis. There is severe concentric left  ventricular hypertrophy. Left ventricular diastolic   parameters are consistent with Grade II diastolic dysfunction  (pseudonormalization). There is the interventricular septum is flattened  in diastole ('D' shaped left ventricle), consistent with right ventricular  volume overload.   2. Right ventricular systolic function is moderately reduced. The right  ventricular size  is moderately enlarged. Mildly increased right  ventricular wall thickness. There is moderately elevated pulmonary artery  systolic pressure. The estimated right  ventricular systolic pressure is [id number] mmHg.   3. Left atrial size was mildly dilated.   4. Right atrial size was severely dilated.   5. A small pericardial effusion is present. No evidence of tamponade.   6. The mitral valve is grossly normal. Trivial mitral valve  regurgitation.   7. Tricuspid valve regurgitation is severe.   8. The aortic valve is tricuspid. Aortic valve regurgitation is mild to  moderate.   9. Pulmonic valve regurgitation is moderate.  10. Moderately dilated pulmonary artery.  11. The inferior vena cava is dilated in size with <50% respiratory  variability, suggesting right atrial pressure of 15 mmHg.   Comparison(s): Significant changes: worsening LVEF with torrential,  functional tricuspid regurgitation.   Conclusion(s)/Recommendation(s): Notable hypertrophy, atrial dilation, and  effusion: amyloid nuclear imaging may be indicated as outpatient.   Patient Profile     66 y.o.  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 Reesa Chew.  Assessment & Plan    Acute on chronic combined systolic and diastolic heart failure:   His RV is horrible with severe MR and WHO class 2/3 pulmonary HTN. For right heart today would keep at [facility] and have CHF team see given azotemia Milrinone for RV function may be useful until Cr gets to baseline               Acute on chronic renal insufficiency: Creatinine 1.83-->1.56-->2.46->2.61->2.3  Likely ATN from hypotension on top of CRF and low output from RV failure Nephrology is seeing Guide diuretic Rx based on PCWP at cath today     EtOH and tobacco use: He smokes half a pack per day.  Per patient, he previously managed to quit drinking for many years, however recently resumed alcohol after the passing away of multiple siblings   Paroxysmal atrial flutter: On Eliquis and metoprolol succinate at home transition back to [place] post cath    Hypertension   History of pulmonary hypertension   Severe COPD: Followed by pulmonology service.     For questions or updates, please contact [place] Please consult [email or web address] for contact info under        Signed, Jenkins Rouge, MD  11/16/2021, 9:16 AM

## 2021-11-17 ENCOUNTER — Telehealth: Payer: Self-pay | Admitting: Internal Medicine

## 2021-11-17 DIAGNOSIS — J9612 Chronic respiratory failure with hypercapnia: Secondary | ICD-10-CM | POA: Diagnosis not present

## 2021-11-17 DIAGNOSIS — N179 Acute kidney failure, unspecified: Secondary | ICD-10-CM

## 2021-11-17 DIAGNOSIS — R768 Other specified abnormal immunological findings in serum: Secondary | ICD-10-CM

## 2021-11-17 DIAGNOSIS — J9611 Chronic respiratory failure with hypoxia: Secondary | ICD-10-CM | POA: Diagnosis not present

## 2021-11-17 DIAGNOSIS — I4892 Unspecified atrial flutter: Secondary | ICD-10-CM | POA: Diagnosis not present

## 2021-11-17 DIAGNOSIS — I5033 Acute on chronic diastolic (congestive) heart failure: Secondary | ICD-10-CM | POA: Diagnosis not present

## 2021-11-17 DIAGNOSIS — N1831 Chronic kidney disease, stage 3a: Secondary | ICD-10-CM

## 2021-11-17 LAB — CBC
HCT: 39.6 % (ref 39.0–52.0)
Hemoglobin: 13.4 g/dL (ref 13.0–17.0)
MCH: 30.9 pg (ref 26.0–34.0)
MCHC: 33.8 g/dL (ref 30.0–36.0)
MCV: 91.5 fL (ref 80.0–100.0)
Platelets: 102 10*3/uL — ABNORMAL LOW (ref 150–400)
RBC: 4.33 MIL/uL (ref 4.22–5.81)
RDW: 16.2 % — ABNORMAL HIGH (ref 11.5–15.5)
WBC: 4.4 10*3/uL (ref 4.0–10.5)
nRBC: 0 % (ref 0.0–0.2)

## 2021-11-17 LAB — HEPARIN LEVEL (UNFRACTIONATED)
Heparin Unfractionated: 0.22 IU/mL — ABNORMAL LOW (ref 0.30–0.70)
Heparin Unfractionated: 0.38 IU/mL (ref 0.30–0.70)

## 2021-11-17 LAB — BASIC METABOLIC PANEL
Anion gap: 6 (ref 5–15)
BUN: 39 mg/dL — ABNORMAL HIGH (ref 8–23)
CO2: 32 mmol/L (ref 22–32)
Calcium: 8.6 mg/dL — ABNORMAL LOW (ref 8.9–10.3)
Chloride: 103 mmol/L (ref 98–111)
Creatinine, Ser: 1.77 mg/dL — ABNORMAL HIGH (ref 0.61–1.24)
GFR, Estimated: 41 mL/min — ABNORMAL LOW (ref >60)
Glucose, Bld: 108 mg/dL — ABNORMAL HIGH (ref 70–99)
Potassium: 4.3 mmol/L (ref 3.5–5.1)
Sodium: 141 mmol/L (ref 135–145)

## 2021-11-17 LAB — APTT
aPTT: 45 seconds — ABNORMAL HIGH (ref 24–36)
aPTT: 72 seconds — ABNORMAL HIGH (ref 24–36)

## 2021-11-17 LAB — MAGNESIUM: Magnesium: 1.6 mg/dL — ABNORMAL LOW (ref 1.7–2.4)

## 2021-11-17 MED ORDER — FUROSEMIDE 40 MG PO TABS
40.0000 mg | ORAL_TABLET | Freq: Two times a day (BID) | ORAL | Status: DC
  Administered 2021-11-17 (×2): 40 mg via ORAL
  Filled 2021-11-17 (×2): qty 1

## 2021-11-17 MED ORDER — APIXABAN 5 MG PO TABS
5.0000 mg | ORAL_TABLET | Freq: Two times a day (BID) | ORAL | Status: DC
  Administered 2021-11-17 – 2021-11-18 (×3): 5 mg via ORAL
  Filled 2021-11-17 (×3): qty 1

## 2021-11-17 NOTE — Assessment & Plan Note (Signed)
stable °

## 2021-11-17 NOTE — Assessment & Plan Note (Signed)
recommend cessation

## 2021-11-17 NOTE — Assessment & Plan Note (Signed)
--  chronic, stable, mild, follow-up as an outpatient

## 2021-11-17 NOTE — Progress Notes (Signed)
Subjective:  no UOP recorded last 24 hours but he says he has had -  labs done late last night show improvement in renal function  Objective Vital signs in last 24 hours: Vitals:   11/16/21 1535 11/16/21 1930 11/16/21 2021 11/17/21 0500  BP: 138/86 129/77 119/73   Pulse: 79 84 82   Resp: 20 (!) 24 (!) 22   Temp:  98.2 F (36.8 C) 98.1 F (36.7 C) 98.1 F (36.7 C)  TempSrc:  Oral Oral Oral  SpO2:  90% 93%   Weight:      Height:       Weight change:   Intake/Output Summary (Last 24 hours) at 11/17/2021 0725 Last data filed at 11/17/2021 0038 Gross per 24 hour  Intake 202.2 ml  Output --  Net 202.2 ml    Assessment/Plan: 66 year old BM with significant pulmonary pathology and now new worsening cardiac pathology and also with A on CRF 1.Renal- patient with some baseline CKD- not entirely clear why-  renal ultrasound does show smaller right kidney so question some vascular insufficiency in the past.  Now with A on CRF I think mostly due to the hypotension brought on by diuresis but not necessarily volume depleted.  He has been given moderate dose metoprolol which can lower BP.   U/A shows red and WBC > 50 100 of protein-  will quantify protein ( was low 0.2) and check culture as well-  I have taken the liberty of decreasing his metoprolol-  BP seems much better/higher.  I will consider midodrine if BP is still low but it is not.  Fortunately it appears that renal function is improved-  I think from better renal perfusion.  There are no indications for dialysis 2. Hypertension/volume  - as above -  hypotension is an issue-  have decreased the metoprolol-  He may still be a little volume overloaded -  can resume some diuretic today.  Difficult to assess his SOB clinically as is also due to his COPD which seems pretty severe     Cecille Aver    Labs: Basic Metabolic Panel: Recent Labs  Lab 11/15/21 0603 11/16/21 0439 11/16/21 1226 11/16/21 2350  NA 137 139 138  139 141  K  4.5 4.7 4.4  4.3 4.3  CL 97* 100  --  103  CO2 32 29  --  32  GLUCOSE 91 84  --  108*  BUN 40* 47*  --  39*  CREATININE 2.61* 2.31*  --  1.77*  CALCIUM 8.9 8.8*  --  8.6*   Liver Function Tests: No results for input(s): AST, ALT, ALKPHOS, BILITOT, PROT, ALBUMIN in the last 168 hours. No results for input(s): LIPASE, AMYLASE in the last 168 hours. No results for input(s): AMMONIA in the last 168 hours. CBC: Recent Labs  Lab 11/12/21 1838 11/13/21 0347 11/14/21 0357 11/15/21 0603 11/16/21 0439 11/16/21 1226 11/16/21 2350  WBC 3.6* 1.8* 5.4 5.1 4.5  --  4.4  NEUTROABS 1.8 1.7  --   --   --   --   --   HGB 15.6 14.6 14.0 13.9 14.3 16.0  16.0 13.4  HCT 47.1 44.5 43.0 41.3 40.9 47.0  47.0 39.6  MCV 94.6 94.7 95.3 93.4 91.1  --  91.5  PLT 110* 101* 107* 110* 100*  --  102*   Cardiac Enzymes: No results for input(s): CKTOTAL, CKMB, CKMBINDEX, TROPONINI in the last 168 hours. CBG: No results for input(s): GLUCAP in  the last 168 hours.  Iron Studies: No results for input(s): IRON, TIBC, TRANSFERRIN, FERRITIN in the last 72 hours. Studies/Results: CARDIAC CATHETERIZATION  Result Date: 11/16/2021   Hemodynamic findings consistent with moderate pulmonary hypertension. 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 Pulmonary vascular resistance 4.8 [place] units Mean right atrial pressure 16, PAPI 2.8 Cardiac output 5.2 L/min Cardiac index 2.74 L/min/m Mixed venous/main pulmonary artery O2 saturation 68% RECOMMENDATIONS: Plan Per treating team.  Right atrial pressures are elevated.  Left heart hemodynamics suggest compensated heart failure at this point.  US RENAL  Result Date: 11/15/2021 CLINICAL DATA:  Acute kidney injury. EXAM: RENAL / URINARY TRACT ULTRASOUND COMPLETE COMPARISON:  October 01, 2019. FINDINGS: Right Kidney: Renal measurements: 12.0 x 4.9 x 4.6 cm = volume: 140 mL. Increased echogenicity of renal parenchyma is noted. No mass  or hydronephrosis visualized. Left Kidney: Renal measurements: 9.1 x 5.1 x 3.7 cm = volume: 90 mL. Increased echogenicity of renal parenchyma is noted. No mass or hydronephrosis visualized. Bladder: Appears normal for degree of bladder distention. Other: Mild ascites is noted in the abdomen. IMPRESSION: Increased echogenicity of renal parenchyma is noted bilaterally suggesting medical renal disease. Mild left renal atrophy is noted. No hydronephrosis or renal obstruction is noted. Mild ascites. Electronically Signed   By: Lupita Raider M.D.   On: 11/15/2021 08:49   Medications: Infusions:  sodium chloride 10 mL/hr at 11/17/21 0038   heparin 1,100 Units/hr (11/17/21 0109)    Scheduled Medications:  atorvastatin  20 mg Oral q1800   metoprolol succinate  25 mg Oral Daily   nicotine  14 mg Transdermal Daily   sodium chloride flush  3 mL Intravenous Q12H   sodium chloride flush  3 mL Intravenous Q12H   umeclidinium-vilanterol  1 puff Inhalation Daily    have reviewed scheduled and prn medications.  Physical Exam: General:  alert NAD   Heart:RRR Lungs: poor air movement but no rales Abdomen: soft, non tender Extremities: min LE edema   11/17/2021,7:25 AM  LOS: 4 days

## 2021-11-17 NOTE — Assessment & Plan Note (Signed)
--  lab not in system; follow-up as an outpatient with Dr. Caryl Never at [facility]

## 2021-11-17 NOTE — Evaluation (Signed)
Physical Therapy Evaluation Patient Details Name: Aaron Fuller MRN: [id number] DOB: June 26, 1955 Today's Date: 11/17/2021  History of Present Illness  The pt is a 66 yo male presenting to [facility] 5/22 from PCP with SOB with hypoxia to 78% on RA, found to have acute CHF exacerbation. Transferred to [facility] on 5/26 for R heart cath (R radial access). PMH includes: COPD, CHF, aflutter on Eliquis, tobacco dependence, prior alcohol abuse, CKD III, and hepatitis C.   Clinical Impression  Pt in bed upon arrival of PT, agreeable to evaluation at this time. Prior to admission the pt was ambulating independently without need for DME, states he was independent with all ADLs and IADLs, "always" using 3L O2. The pt now presents with limitations in functional mobility and endurance due to above dx, and will continue to benefit from skilled PT to address these deficits. He was able to demo good functional strength and stability for transfers without need for UE support, but is limited to ~100 ft at a time of ambulation due to SpO2 dropping to first low of 84%, then 79% while on 3L after additional ambulation. The pt required seated rest and 4L O2 to recover and maintain SpO2 > 90%. Will continue to follow to attempt to wean O2 back to baseline 3L with activity and train on improved self-monitoring of exertion and energy conservation strategies.   Gait Speed: 0.47m/s with 3L O2. (Gait speed < 1.65m/s indicates increased risk of falls)        Recommendations for follow up therapy are one component of a multi-disciplinary discharge planning process, led by the attending physician.  Recommendations may be updated based on patient status, additional functional criteria and insurance authorization.  Follow Up Recommendations No PT follow up    Assistance Recommended at Discharge PRN  Patient can return home with the following       Equipment Recommendations Other (comment) (trial rollator for endurance)  Recommendations  for Other Services       Functional Status Assessment Patient has had a recent decline in their functional status and demonstrates the ability to make significant improvements in function in a reasonable and predictable amount of time.     Precautions / Restrictions Precautions Precautions: Other (comment) Precaution Comments: watch SpO2, on 3-4L this session Restrictions Weight Bearing Restrictions: Yes Other Position/Activity Restrictions: limited wt through RUE s/p RHC on 5/26      Mobility  Bed Mobility Overal bed mobility: Independent             General bed mobility comments: pt OOB in recliner upon arrival and at end of session    Transfers Overall transfer level: Modified independent Equipment used: None Transfers: Sit to/from Stand Sit to Stand: Supervision           General transfer comment: no use of UE, no physical assist or instability    Ambulation/Gait Ambulation/Gait assistance: Supervision Gait Distance (Feet): 100 Feet (+ 125 ft) Assistive device: None Gait Pattern/deviations: Step-through pattern, Decreased stride length Gait velocity: 0.71 m/s Gait velocity interpretation: 1.31 - 2.62 ft/sec, indicative of limited community ambulator   General Gait Details: mildly slowed, pt stopping after 183ft to check O2 reading. SpO2 to low of 84% with gait, improved to 91% and continued walking. Upon return to room with seated rest, SpO2 to low of 79% and needed on 4L to recover to 90s and maintain.     Balance Overall balance assessment: No apparent balance deficits (not formally assessed)  Pertinent Vitals/Pain Pain Assessment Pain Assessment: No/denies pain    Home Living Family/patient expects to be discharged to:: Private residence Living Arrangements: Alone Available Help at Discharge: Friend(s) Type of Home: House Home Access: Stairs to enter Entrance Stairs-Rails: Can  reach both Entrance Stairs-Number of Steps: 4   Home Layout: One level Home Equipment: None Additional Comments: pt on 3L O2, states he used "always" but admitted after arriving to MD appt without O2. also telling me he mostly lives alone unless he wants a girl to stay with him    Prior Function Prior Level of Function : Independent/Modified Independent;Driving             Mobility Comments: independent without DME, states he uses O2 "always" ADLs Comments: pt reports independence     Hand Dominance   Dominant Hand: Right    Extremity/Trunk Assessment   Upper Extremity Assessment Upper Extremity Assessment: Defer to OT evaluation    Lower Extremity Assessment Lower Extremity Assessment: Overall WFL for tasks assessed    Cervical / Trunk Assessment Cervical / Trunk Assessment: Normal  Communication   Communication: No difficulties  Cognition Arousal/Alertness: Awake/alert Behavior During Therapy: WFL for tasks assessed/performed Overall Cognitive Status: Within Functional Limits for tasks assessed                                 General Comments: poor insight to importance of O2 or self-monitoring exertion        General Comments General comments (skin integrity, edema, etc.): SpO2 to low of 84% with gait, recovered briefly to 90s, then to low of 79% after gait on 3L, required 4L and 3 min seated rest to recover to 90s and maintain        Assessment/Plan    PT Assessment Patient needs continued PT services  PT Problem List Cardiopulmonary status limiting activity;Decreased activity tolerance       PT Treatment Interventions DME instruction;Gait training;Stair training;Therapeutic activities;Therapeutic exercise;Balance training;Functional mobility training;Patient/family education    PT Goals (Current goals can be found in the Care Plan section)  Acute Rehab PT Goals Patient Stated Goal: Go home soon PT Goal Formulation: With patient Time  For Goal Achievement: 11/30/21 Potential to Achieve Goals: Good    Frequency Min 3X/week        AM-PAC PT "6 Clicks" Mobility  Outcome Measure Help needed turning from your back to your side while in a flat bed without using bedrails?: None Help needed moving from lying on your back to sitting on the side of a flat bed without using bedrails?: None Help needed moving to and from a bed to a chair (including a wheelchair)?: None Help needed standing up from a chair using your arms (e.g., wheelchair or bedside chair)?: None Help needed to walk in hospital room?: A Little Help needed climbing 3-5 steps with a railing? : A Little 6 Click Score: 22    End of Session Equipment Utilized During Treatment: Gait belt;Oxygen Activity Tolerance: Patient tolerated treatment well Patient left: in chair;with call bell/phone within reach Nurse Communication: Mobility status;Other (comment) (O2) PT Visit Diagnosis: Other abnormalities of gait and mobility (R26.89)    Time: 7893-8101 PT Time Calculation (min) (ACUTE ONLY): 29 min   Charges:   PT Evaluation $PT Eval Low Complexity: 1 Low PT Treatments $Therapeutic Exercise: 8-22 mins        Vickki Muff, PT, DPT   Acute Rehabilitation Department Pager #: [phone]  Ronnie Derby 11/17/2021, 10:06 AM

## 2021-11-17 NOTE — Assessment & Plan Note (Signed)
--  baseline 3L

## 2021-11-17 NOTE — Assessment & Plan Note (Addendum)
--  no evidence of withdrawal. multiple siblings passed away several months back-he apparently quit taking his medications and drank quite a bit of alcohol-he apparently has been abstinent for 2 months.

## 2021-11-17 NOTE — Progress Notes (Signed)
Progress Note   Patient: Aaron Fuller [id number] DOB: 04/23/1956 DOA: 11/12/2021     4 DOS: the patient was seen and examined on 11/17/2021   Brief hospital course: 66 year old with history of COPD with chronic hypoxic respiratory failure on 3 L of oxygen at baseline, HFpEF, PAF, hepatitis C-who presented shortness of breath and lower extremity edema-he was initially thought to have acute on chronic HFpEF, however echocardiogram showed significant RV dysfunction-he was subsequently transferred to [facility] for [place].  Fortunately does not require frequent checks.  Renal function improving diuresis started again by cardiology and nephrology.  Respiratory status appears stable.  Assessment and Plan: * Acute on chronic combined systolic and diastolic CHF (congestive heart failure) (HCC) --continue BB, Lasix, stain --per cardiology no need for inotropes  AKI ([place]) superimposed on CKD IIIA --followed by nephrology, thought secondary to hypotension from diuresis; BB adjusted; considering midodrine --renal function improved  Chronic respiratory failure with hypoxia and hypercapnia (HCC) --baseline 3L  Emphysema of lung (HCC) --stable  CKD stage IIIa --baseline creatinine around 1.5  Paroxysmal atrial flutter (HCC) --continue apixaban, BB  Thrombocytopenia (HCC) --chronic, stable, mild, follow-up as an outpatient  Alcohol abuse --no evidence of withdrawal. multiple siblings passed away several months back-he apparently quit taking his medications and drank quite a bit of alcohol-he apparently has been abstinent for 2 months.   Tobacco dependence --recommend cessation  HCV antibody positive --lab not in system; follow-up as an outaptient   Significant events: 5/22>> referred to [facility] ED by PCP for worsening shortness of breath/hypoxemia-significant volume overload-admit to [facility]. 5/26>> transfer to [facility] for RHC.   Significant studies: 5/22>> CXR: No PNA 5/22>> renal ultrasound: No  hydronephrosis-increased echogenicity noted bilaterally 5/23>> Echo: EF [id number], grade 2 diastolic dysfunction, f RV systolic function moderately reduced, RV systolic pressure [id number].   Significant microbiology data: 5/22>> COVID/influenza PCR: Negative   Procedures: 5/26>> RHC: Mean PA pressure 38 mmHg Mean wedge pressure 13 mmHg Pulmonary vascular resistance 4.8 Woods units Elevated mean right atrial pressure =16 mmHg. PAPI = 2.8   Consults: PCCM, cardiology, nephrology     Subjective:  Feels better; breathing better  Physical Exam: Vitals:   11/16/21 1535 11/16/21 1930 11/16/21 2021 11/17/21 0500  BP: 138/86 129/77 119/73 (!) 132/94  Pulse: 79 84 82 78  Resp: 20 (!) 24 (!) 22 18  Temp:  98.2 F (36.8 C) 98.1 F (36.7 C) 98.1 F (36.7 C)  TempSrc:  Oral Oral Oral  SpO2:  90% 93% 92%  Weight:    73.7 kg  Height:       Physical Exam Vitals reviewed.  Constitutional:      General: He is not in acute distress.    Appearance: He is not ill-appearing or toxic-appearing.  Cardiovascular:     Rate and Rhythm: Normal rate and regular rhythm.     Heart sounds: No murmur heard. Pulmonary:     Effort: Pulmonary effort is normal. No respiratory distress.     Breath sounds: No wheezing, rhonchi or rales.  Musculoskeletal:     Right lower leg: No edema.     Left lower leg: No edema.  Neurological:     Mental Status: He is alert.  Psychiatric:        Mood and Affect: Mood normal.        Behavior: Behavior normal.    Data Reviewed:  No labs today  Family Communication: none  Disposition: Status is: Inpatient Remains inpatient appropriate because: acute CHF, AKI  Planned  Discharge Destination: Home    Time spent: 25 minutes  Author: Murray Hodgkins, MD 11/17/2021 11:34 AM  For on call review [email or web address].

## 2021-11-17 NOTE — Assessment & Plan Note (Signed)
--  continue BB, Lasix, stain --per cardiology no need for inotropes --Notable hypertrophy, atrial dilation, and effusion: amyloid nuclear imaging may be indicated as outpatient.

## 2021-11-17 NOTE — Progress Notes (Signed)
ANTICOAGULATION CONSULT NOTE - Follow Up Consult  Pharmacy Consult for heparin Indication: atrial fibrillation  Labs: Recent Labs    11/14/21 1949 11/15/21 0603 11/15/21 1700 11/16/21 0439 11/16/21 1226 11/16/21 2350  HGB  --  13.9  --  14.3 16.0  16.0 13.4  HCT  --  41.3  --  40.9 47.0  47.0 39.6  PLT  --  110*  --  100*  --  PENDING  APTT 35 114* 68*  --   --  45*  HEPARINUNFRC >1.10* >1.10*  --   --   --  0.22*  CREATININE  --  2.61*  --  2.31*  --  1.77*    Assessment: 66yo male subtherapeutic on heparin after resumed post-cath; no infusion issues or signs of bleeding per RN.  Goal of Therapy:  Heparin level 0.3-0.7 units/ml aPTT 66-102 seconds   Plan:  Will increase heparin infusion by 2 units/kg/hr to 1100 units/hr and check level in 8 hours.    Vernard Gambles, PharmD, BCPS  11/17/2021,12:57 AM

## 2021-11-17 NOTE — Hospital Course (Signed)
66 year old with history of COPD with chronic hypoxic respiratory failure on 3 L of oxygen at baseline, HFpEF, PAF, hepatitis C-who presented shortness of breath and lower extremity edema-he was initially thought to have acute on chronic HFpEF, however echocardiogram showed significant RV dysfunction-he was subsequently transferred to [facility] for RHC.  Fortunately does not require frequent checks.  Renal function improving diuresis started again by cardiology and nephrology.  Respiratory status appears stable.

## 2021-11-17 NOTE — Assessment & Plan Note (Addendum)
--  baseline creatinine around 1.5

## 2021-11-17 NOTE — Assessment & Plan Note (Addendum)
--  continue apixaban, BB

## 2021-11-17 NOTE — Assessment & Plan Note (Signed)
--  followed by nephrology, thought secondary to hypotension from diuresis; BB adjusted; considering midodrine --renal function improved

## 2021-11-17 NOTE — Progress Notes (Signed)
Progress Note  Patient Name: Aaron Fuller Date of Encounter: 11/17/2021  [facility] Cardiologist: Armanda Magic, MD   Subjective   Looks better in chair eating breakfast   Inpatient Medications    Scheduled Meds:  atorvastatin  20 mg Oral q1800   furosemide  40 mg Oral BID   metoprolol succinate  25 mg Oral Daily   nicotine  14 mg Transdermal Daily   sodium chloride flush  3 mL Intravenous Q12H   sodium chloride flush  3 mL Intravenous Q12H   umeclidinium-vilanterol  1 puff Inhalation Daily   Continuous Infusions:  sodium chloride 10 mL/hr at 11/17/21 0038   heparin 1,100 Units/hr (11/17/21 0109)   PRN Meds: sodium chloride, acetaminophen **OR** acetaminophen, acetaminophen, guaiFENesin, hydrALAZINE, ipratropium-albuterol, metoprolol tartrate, ondansetron **OR** ondansetron (ZOFRAN) IV, ondansetron (ZOFRAN) IV, senna-docusate, sodium chloride flush, traZODone   Vital Signs    Vitals:   11/16/21 1535 11/16/21 1930 11/16/21 2021 11/17/21 0500  BP: 138/86 129/77 119/73 (!) 132/94  Pulse: 79 84 82 78  Resp: 20 (!) 24 (!) 22 18  Temp:  98.2 F (36.8 C) 98.1 F (36.7 C) 98.1 F (36.7 C)  TempSrc:  Oral Oral Oral  SpO2:  90% 93% 92%  Weight:    73.7 kg  Height:        Intake/Output Summary (Last 24 hours) at 11/17/2021 0854 Last data filed at 11/17/2021 9150 Gross per 24 hour  Intake 406.21 ml  Output 475 ml  Net -68.79 ml      11/17/2021    5:00 AM 11/16/2021    4:33 AM 11/15/2021    5:00 AM  Last 3 Weights  Weight (lbs) 162 lb 8 oz 165 lb 12.6 oz 169 lb 5 oz  Weight (kg) 73.71 kg 75.2 kg 76.8 kg      Telemetry    NSR without significant ventricular ectopy - Personally Reviewed  ECG    NSR without poor R wave progression in the anterior leads - Personally Reviewed  Physical Exam   GEN: No acute distress.   Neck: No JVD Cardiac:JVP elevated RV lift   Respiratory: Clear to auscultation bilaterally. GI: Soft, nontender, non-distended  MS: No edema;  No deformity. Neuro:  Nonfocal  Psych: Normal affect   Labs    High Sensitivity Troponin:  No results for input(s): TROPONINIHS in the last 720 hours.   Chemistry Recent Labs  Lab 11/15/21 0603 11/16/21 0439 11/16/21 1226 11/16/21 2350  NA 137 139 138  139 141  K 4.5 4.7 4.4  4.3 4.3  CL 97* 100  --  103  CO2 32 29  --  32  GLUCOSE 91 84  --  108*  BUN 40* 47*  --  39*  CREATININE 2.61* 2.31*  --  1.77*  CALCIUM 8.9 8.8*  --  8.6*  MG 1.6* 1.8  --  1.6*  GFRNONAA 26* 30*  --  41*  ANIONGAP 8 10  --  6    Lipids No results for input(s): CHOL, TRIG, HDL, LABVLDL, LDLCALC, CHOLHDL in the last 168 hours.  Hematology Recent Labs  Lab 11/15/21 0603 11/16/21 0439 11/16/21 1226 11/16/21 2350  WBC 5.1 4.5  --  4.4  RBC 4.42 4.49  --  4.33  HGB 13.9 14.3 16.0  16.0 13.4  HCT 41.3 40.9 47.0  47.0 39.6  MCV 93.4 91.1  --  91.5  MCH 31.4 31.8  --  30.9  MCHC 33.7 35.0  --  33.8  RDW 17.1* 16.7*  --  16.2*  PLT 110* 100*  --  102*   Thyroid No results for input(s): TSH, FREET4 in the last 168 hours.  BNP Recent Labs  Lab 11/12/21 1838  BNP 678.8*    DDimer No results for input(s): DDIMER in the last 168 hours.   Radiology    CARDIAC CATHETERIZATION  Result Date: 11/16/2021   Hemodynamic findings consistent with moderate pulmonary hypertension. 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 Pulmonary vascular resistance 4.8 Woods units Mean right atrial pressure 16, PAPI 2.8 Cardiac output 5.2 L/min Cardiac index 2.74 L/min/m Mixed venous/main pulmonary artery O2 saturation 68% RECOMMENDATIONS: Plan Per treating team.  Right atrial pressures are elevated.  Left heart hemodynamics suggest compensated heart failure at this point.   Cardiac Studies   Echo 09/28/2019 1. Left ventricular ejection fraction, by estimation, is 55 to 60%. The  left ventricle has normal function. The left ventricle has no regional  wall motion  abnormalities. There is mild concentric left ventricular  hypertrophy. Left ventricular diastolic  parameters are consistent with Grade I diastolic dysfunction (impaired  relaxation).   2. Right ventricular systolic function is moderately reduced. The right  ventricular size is moderately enlarged. There is moderately elevated  pulmonary artery systolic pressure. The estimated right ventricular  systolic pressure is [id number] mmHg.   3. Right atrial size was moderately dilated.   4. The mitral valve is normal in structure. No evidence of mitral valve  regurgitation.   5. The aortic valve is tricuspid. Aortic valve regurgitation is moderate.  No aortic stenosis is present.   6. The inferior vena cava is dilated in size with <50% respiratory  variability, suggesting right atrial pressure of 15 mmHg.     Cath 09/29/2019 Hemodynamic findings consistent with moderate pulmonary hypertension.   Moderately elevated right heart pressures with PA pressure 63/28 and mean pressure 39 mmHg consistent with at least moderate pulmonary hypertension.   PVR: 2.9 WU   LVEDP 17 - 18 mmHg.   Normal epicardial coronary arteries.   RECOMMENDATION: Optimization of fluid status, blood pressure, and cardiac rhythm in this patient diastolic dysfunction who presented with SVT/atrial flutter.  Smoking cessation is essential.     Echo 11/13/2021 1. Left ventricular ejection fraction, by estimation, is 40 to 45%. The  left ventricle has mildly decreased function. The left ventricle  demonstrates global hypokinesis. There is severe concentric left  ventricular hypertrophy. Left ventricular diastolic   parameters are consistent with Grade II diastolic dysfunction  (pseudonormalization). There is the interventricular septum is flattened  in diastole ('D' shaped left ventricle), consistent with right ventricular  volume overload.   2. Right ventricular systolic function is moderately reduced. The right  ventricular  size is moderately enlarged. Mildly increased right  ventricular wall thickness. There is moderately elevated pulmonary artery  systolic pressure. The estimated right  ventricular systolic pressure is [id number] mmHg.   3. Left atrial size was mildly dilated.   4. Right atrial size was severely dilated.   5. A small pericardial effusion is present. No evidence of tamponade.   6. The mitral valve is grossly normal. Trivial mitral valve  regurgitation.   7. Tricuspid valve regurgitation is severe.   8. The aortic valve is tricuspid. Aortic valve regurgitation is mild to  moderate.   9. Pulmonic valve regurgitation is moderate.  10. Moderately dilated pulmonary artery.  11. The inferior vena cava is dilated in size with <  50% respiratory  variability, suggesting right atrial pressure of 15 mmHg.   Comparison(s): Significant changes: worsening LVEF with torrential,  functional tricuspid regurgitation.   Conclusion(s)/Recommendation(s): Notable hypertrophy, atrial dilation, and  effusion: amyloid nuclear imaging may be indicated as outpatient.   Patient Profile     66 y.o. 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 Reesa Chew.  Assessment & Plan    Acute on chronic combined systolic and diastolic heart failure:   His RV is horrible with severe TR Surprisingly right heart done 11/16/21 not bad with PA mean 38 mmHg PCWP 13 mmHg PVR 4.8 wood units Back on diuretics azotemia improved no need for inotropes               Acute on chronic renal insufficiency: Creatinine 1.83-->1.56-->2.46->2.61->2.3 -> 1.77 Likely ATN from hypotension on top of CRF and low output from RV failure Nephrology is seeing lower dose diuretic  prescribed avoid hypotension   EtOH and tobacco use: He smokes half a pack per day.  Per patient, he previously managed to quit drinking for many years, however recently resumed alcohol after the passing away of multiple siblings   Paroxysmal atrial flutter: resume eliquis post cath    Hypertension   History of pulmonary hypertension   Severe COPD: Followed by pulmonology service.     For questions or updates, please contact [place] Please consult [email or web address] for contact info under        Signed, Jenkins Rouge, MD  11/17/2021, 8:54 AM

## 2021-11-17 NOTE — Evaluation (Signed)
Occupational Therapy Evaluation Patient Details Name: Aaron Fuller MRN: [id number] DOB: November 30, 1955 Today's Date: 11/17/2021   History of Present Illness The pt is a 66 yo male presenting to [facility] 5/22 from PCP with SOB with hypoxia to 78% on RA, found to have acute CHF exacerbation. Transferred to [facility] on 5/26 for R heart cath (R radial access). PMH includes: COPD, CHF, aflutter on Eliquis, tobacco dependence, prior alcohol abuse, CKD III, and hepatitis C.   Clinical Impression   Pt re-evaluated this date as he is s/p R heart cath. Pt currently functioning below baseline 2/2 cardiopulmonary limitations. PTA, pt was independent with ADL and functional mobility and used 3lnc at all times. Pt currently requires supervision with functional mobility, dressing, grooming and with toilet transfers. He was on 4lnc at rest and required 5lnc with exertion following grooming activity pt desated to 80% on 5lnc, required seated return and pursed lip breathing to rebound to 90%.  Pt demonstrates decreased awareness of importance of self-monitoring, use of pursed lip breathing and use of energy conservation strategies. Pt will continue to benefit from acute OT to address energy conservation strategies, activity tolerance, endurance and overall return to prior level of independence.      Recommendations for follow up therapy are one component of a multi-disciplinary discharge planning process, led by the attending physician.  Recommendations may be updated based on patient status, additional functional criteria and insurance authorization.   Follow Up Recommendations  No OT follow up    Assistance Recommended at Discharge PRN  Patient can return home with the following Assistance with cooking/housework;Direct supervision/assist for medications management    Functional Status Assessment  Patient has had a recent decline in their functional status and demonstrates the ability to make significant improvements in  function in a reasonable and predictable amount of time.  Equipment Recommendations  Tub/shower bench    Recommendations for Other Services       Precautions / Restrictions Precautions Precautions: Other (comment) Precaution Comments: watch SpO2, on 3-4L this session Restrictions Weight Bearing Restrictions: Yes Other Position/Activity Restrictions: limited wt through RUE s/p RHC on 5/26      Mobility Bed Mobility               General bed mobility comments: pt OOB in recliner upon arrival and at end of session    Transfers Overall transfer level: Needs assistance Equipment used: None Transfers: Sit to/from Stand Sit to Stand: Supervision           General transfer comment: no use of UE, no physical assist or instability      Balance Overall balance assessment: No apparent balance deficits (not formally assessed)                                         ADL either performed or assessed with clinical judgement   ADL Overall ADL's : Needs assistance/impaired                                       General ADL Comments: pt currently requires supervision for ADL completion (toilet transfers, LB dressing and grooming at sink level), pt required assistance with line management and cues to monitor O2 levels with exertion, cues for pursed lip breathing and educated on use of energy conservation strategies.  Vision Patient Visual Report: No change from baseline       Perception     Praxis      Pertinent Vitals/Pain Pain Assessment Pain Assessment: No/denies pain     Hand Dominance Right   Extremity/Trunk Assessment Upper Extremity Assessment Upper Extremity Assessment: Overall WFL for tasks assessed   Lower Extremity Assessment Lower Extremity Assessment: Overall WFL for tasks assessed   Cervical / Trunk Assessment Cervical / Trunk Assessment: Normal   Communication Communication Communication: No difficulties    Cognition Arousal/Alertness: Awake/alert Behavior During Therapy: WFL for tasks assessed/performed Overall Cognitive Status: Within Functional Limits for tasks assessed                                 General Comments: poor insight to importance of O2 or self-monitoring exertion     General Comments  spO2 95% at rest on 4lnc, with exertion SpO2 dropped to 80% on 4lnc, required 5lnc, pursed lip breath and seated rest break to return to >90%; pt educated on importance on monitoring O2, pursed lip breathing an maintaining SpO2 >90%    Exercises     Shoulder Instructions      Home Living Family/patient expects to be discharged to:: Private residence Living Arrangements: Alone Available Help at Discharge: Friend(s) Type of Home: House Home Access: Stairs to enter Secretary/administrator of Steps: 4 Entrance Stairs-Rails: Can reach both Home Layout: One level     Bathroom Shower/Tub: Chief Strategy Officer: Standard Bathroom Accessibility: Yes   Home Equipment: None   Additional Comments: pt on 3L O2, states he used "always" but admitted after arriving to MD appt without O2. also telling me he mostly lives alone unless he wants a girl to stay with him      Prior Functioning/Environment Prior Level of Function : Independent/Modified Independent;Driving             Mobility Comments: independent without DME, states he uses O2 "always" ADLs Comments: pt reports independence        OT Problem List: Decreased activity tolerance;Cardiopulmonary status limiting activity      OT Treatment/Interventions: Self-care/ADL training;Energy conservation;Therapeutic activities;Patient/family education    OT Goals(Current goals can be found in the care plan section) Acute Rehab OT Goals Patient Stated Goal: to go home OT Goal Formulation: With patient Time For Goal Achievement: 12/01/21 Potential to Achieve Goals: Good ADL Goals Additional ADL Goal #1:  Pt will demonstrate independence with 3 energy conservation strategies during ADL completion. Additional ADL Goal #2: Pt will verbalize/demonstrate understanding of monitoring O2 and maintaining SpO2 >92%.  OT Frequency: Min 2X/week    Co-evaluation              AM-PAC OT "6 Clicks" Daily Activity     Outcome Measure Help from another person eating meals?: None Help from another person taking care of personal grooming?: A Little Help from another person toileting, which includes using toliet, bedpan, or urinal?: A Little Help from another person bathing (including washing, rinsing, drying)?: A Little Help from another person to put on and taking off regular upper body clothing?: None Help from another person to put on and taking off regular lower body clothing?: A Little 6 Click Score: 20   End of Session Equipment Utilized During Treatment: Oxygen  Activity Tolerance: Patient tolerated treatment well Patient left: with call bell/phone within reach;in chair  OT Visit Diagnosis: Unsteadiness on feet (R26.81)  Time: 1047-1101 OT Time Calculation (min): 14 min Charges:  OT General Charges $OT Visit: 1 Visit OT Evaluation $OT Eval Low Complexity: 1 Low  Sajid Ruppert OTR/L Acute Rehabilitation Services Office: [phone]   Rebeca Alert 11/17/2021, 11:59 AM

## 2021-11-18 ENCOUNTER — Encounter (HOSPITAL_COMMUNITY): Payer: Medicare Other

## 2021-11-18 DIAGNOSIS — I4892 Unspecified atrial flutter: Secondary | ICD-10-CM | POA: Diagnosis not present

## 2021-11-18 DIAGNOSIS — I5043 Acute on chronic combined systolic (congestive) and diastolic (congestive) heart failure: Secondary | ICD-10-CM

## 2021-11-18 LAB — URINE CULTURE: Culture: >=100000 — AB

## 2021-11-18 LAB — BASIC METABOLIC PANEL
Anion gap: 6 (ref 5–15)
BUN: 26 mg/dL — ABNORMAL HIGH (ref 8–23)
CO2: 34 mmol/L — ABNORMAL HIGH (ref 22–32)
Calcium: 8.5 mg/dL — ABNORMAL LOW (ref 8.9–10.3)
Chloride: 100 mmol/L (ref 98–111)
Creatinine, Ser: 1.43 mg/dL — ABNORMAL HIGH (ref 0.61–1.24)
GFR, Estimated: 53 mL/min — ABNORMAL LOW (ref >60)
Glucose, Bld: 125 mg/dL — ABNORMAL HIGH (ref 70–99)
Potassium: 3.8 mmol/L (ref 3.5–5.1)
Sodium: 140 mmol/L (ref 135–145)

## 2021-11-18 LAB — CBC
HCT: 38.7 % — ABNORMAL LOW (ref 39.0–52.0)
Hemoglobin: 13.2 g/dL (ref 13.0–17.0)
MCH: 31.7 pg (ref 26.0–34.0)
MCHC: 34.1 g/dL (ref 30.0–36.0)
MCV: 92.8 fL (ref 80.0–100.0)
Platelets: 93 10*3/uL — ABNORMAL LOW (ref 150–400)
RBC: 4.17 MIL/uL — ABNORMAL LOW (ref 4.22–5.81)
RDW: 17 % — ABNORMAL HIGH (ref 11.5–15.5)
WBC: 5 10*3/uL (ref 4.0–10.5)
nRBC: 0 % (ref 0.0–0.2)

## 2021-11-18 LAB — MAGNESIUM: Magnesium: 1.4 mg/dL — ABNORMAL LOW (ref 1.7–2.4)

## 2021-11-18 MED ORDER — FUROSEMIDE 20 MG PO TABS: 20.0000 mg | ORAL_TABLET | Freq: Two times a day (BID) | ORAL | 1 refills | Status: AC

## 2021-11-18 MED ORDER — FUROSEMIDE 20 MG PO TABS
20.0000 mg | ORAL_TABLET | Freq: Two times a day (BID) | ORAL | Status: DC
  Filled 2021-11-18: qty 1

## 2021-11-18 MED ORDER — ALBUTEROL SULFATE HFA 108 (90 BASE) MCG/ACT IN AERS: 2.0000 | INHALATION_SPRAY | Freq: Four times a day (QID) | RESPIRATORY_TRACT | 1 refills | Status: DC | PRN

## 2021-11-18 MED ORDER — METOPROLOL SUCCINATE ER 25 MG PO TB24: 25.0000 mg | ORAL_TABLET | Freq: Every day | ORAL | 1 refills | Status: DC

## 2021-11-18 MED ORDER — UMECLIDINIUM-VILANTEROL 62.5-25 MCG/ACT IN AEPB: 1.0000 | INHALATION_SPRAY | Freq: Every day | RESPIRATORY_TRACT | 1 refills | Status: DC

## 2021-11-18 MED ORDER — NICOTINE 14 MG/24HR TD PT24: 14.0000 mg | MEDICATED_PATCH | Freq: Every day | TRANSDERMAL | 1 refills | Status: DC

## 2021-11-18 MED ORDER — MAGNESIUM SULFATE 4 GM/100ML IV SOLN
4.0000 g | Freq: Once | INTRAVENOUS | Status: AC
  Administered 2021-11-18: 4 g via INTRAVENOUS
  Filled 2021-11-18: qty 100

## 2021-11-18 NOTE — Plan of Care (Signed)
  Problem: Education: Goal: Knowledge of General Education information will improve Description: Including pain rating scale, medication(s)/side effects and non-pharmacologic comfort measures Outcome: Adequate for Discharge   Problem: Health Behavior/Discharge Planning: Goal: Ability to manage health-related needs will improve Outcome: Adequate for Discharge   Problem: Clinical Measurements: Goal: Ability to maintain clinical measurements within normal limits will improve Outcome: Adequate for Discharge Goal: Will remain free from infection Outcome: Adequate for Discharge Goal: Diagnostic test results will improve Outcome: Adequate for Discharge Goal: Respiratory complications will improve Outcome: Adequate for Discharge Goal: Cardiovascular complication will be avoided Outcome: Adequate for Discharge   Problem: Activity: Goal: Risk for activity intolerance will decrease Outcome: Adequate for Discharge   Problem: Nutrition: Goal: Adequate nutrition will be maintained Outcome: Adequate for Discharge   Problem: Coping: Goal: Level of anxiety will decrease Outcome: Adequate for Discharge   Problem: Elimination: Goal: Will not experience complications related to bowel motility Outcome: Adequate for Discharge Goal: Will not experience complications related to urinary retention Outcome: Adequate for Discharge   Problem: Pain Managment: Goal: General experience of comfort will improve Outcome: Adequate for Discharge   Problem: Safety: Goal: Ability to remain free from injury will improve Outcome: Adequate for Discharge   Problem: Skin Integrity: Goal: Risk for impaired skin integrity will decrease Outcome: Adequate for Discharge   Problem: Education: Goal: Ability to demonstrate management of disease process will improve Outcome: Adequate for Discharge Goal: Ability to verbalize understanding of medication therapies will improve Outcome: Adequate for Discharge Goal:  Individualized Educational Video(s) Outcome: Adequate for Discharge   Problem: Activity: Goal: Capacity to carry out activities will improve Outcome: Adequate for Discharge   Problem: Cardiac: Goal: Ability to achieve and maintain adequate cardiopulmonary perfusion will improve Outcome: Adequate for Discharge   Problem: Education: Goal: Ability to demonstrate management of disease process will improve Outcome: Adequate for Discharge Goal: Ability to verbalize understanding of medication therapies will improve Outcome: Adequate for Discharge Goal: Individualized Educational Video(s) Outcome: Adequate for Discharge   Problem: Activity: Goal: Capacity to carry out activities will improve Outcome: Adequate for Discharge   Problem: Cardiac: Goal: Ability to achieve and maintain adequate cardiopulmonary perfusion will improve Outcome: Adequate for Discharge   

## 2021-11-18 NOTE — Progress Notes (Signed)
Progress Note  Patient Name: Aaron Fuller Date of Encounter: 11/18/2021  [facility] Cardiologist: Fransico Him, MD   Subjective   Likely ready for d/c   Inpatient Medications    Scheduled Meds:  apixaban  5 mg Oral BID   atorvastatin  20 mg Oral q1800   [START ON 11/19/2021] furosemide  20 mg Oral BID   metoprolol succinate  25 mg Oral Daily   nicotine  14 mg Transdermal Daily   sodium chloride flush  3 mL Intravenous Q12H   sodium chloride flush  3 mL Intravenous Q12H   umeclidinium-vilanterol  1 puff Inhalation Daily   Continuous Infusions:  sodium chloride 10 mL/hr at 11/17/21 0038   magnesium sulfate bolus IVPB 4 g (11/18/21 0947)   PRN Meds: sodium chloride, acetaminophen **OR** acetaminophen, acetaminophen, guaiFENesin, hydrALAZINE, ipratropium-albuterol, metoprolol tartrate, ondansetron **OR** ondansetron (ZOFRAN) IV, ondansetron (ZOFRAN) IV, senna-docusate, sodium chloride flush, traZODone   Vital Signs    Vitals:   11/17/21 1354 11/17/21 2041 11/18/21 0317 11/18/21 0926  BP: 136/85 112/79 136/79   Pulse: 81 91 88   Resp: 16 (!) 22 20   Temp: 97.7 F (36.5 C) 97.7 F (36.5 C) 98.1 F (36.7 C)   TempSrc: Oral Oral Oral   SpO2: 97%  94% 98%  Weight:   73.4 kg   Height:        Intake/Output Summary (Last 24 hours) at 11/18/2021 1001 Last data filed at 11/18/2021 0947 Gross per 24 hour  Intake 942.37 ml  Output 3190 ml  Net -2247.63 ml      11/18/2021    3:17 AM 11/17/2021    5:00 AM 11/16/2021    4:33 AM  Last 3 Weights  Weight (lbs) 161 lb 12.8 oz 162 lb 8 oz 165 lb 12.6 oz  Weight (kg) 73.392 kg 73.71 kg 75.2 kg      Telemetry    NSR without significant ventricular ectopy - Personally Reviewed  ECG    NSR without poor R wave progression in the anterior leads - Personally Reviewed  Physical Exam   GEN: No acute distress.   Neck: No JVD Cardiac:JVP elevated RV lift   Respiratory: Clear to auscultation bilaterally. GI: Soft, nontender,  non-distended  MS: No edema; No deformity. Neuro:  Nonfocal  Psych: Normal affect   Labs    High Sensitivity Troponin:  No results for input(s): TROPONINIHS in the last 720 hours.   Chemistry Recent Labs  Lab 11/16/21 0439 11/16/21 1226 11/16/21 2350 11/18/21 0305  NA 139 138  139 141 140  K 4.7 4.4  4.3 4.3 3.8  CL 100  --  103 100  CO2 29  --  32 34*  GLUCOSE 84  --  108* 125*  BUN 47*  --  39* 26*  CREATININE 2.31*  --  1.77* 1.43*  CALCIUM 8.8*  --  8.6* 8.5*  MG 1.8  --  1.6* 1.4*  GFRNONAA 30*  --  41* 53*  ANIONGAP 10  --  6 6    Lipids No results for input(s): CHOL, TRIG, HDL, LABVLDL, LDLCALC, CHOLHDL in the last 168 hours.  Hematology Recent Labs  Lab 11/16/21 0439 11/16/21 1226 11/16/21 2350 11/18/21 0305  WBC 4.5  --  4.4 5.0  RBC 4.49  --  4.33 4.17*  HGB 14.3 16.0  16.0 13.4 13.2  HCT 40.9 47.0  47.0 39.6 38.7*  MCV 91.1  --  91.5 92.8  MCH 31.8  --  30.9  31.7  MCHC 35.0  --  33.8 34.1  RDW 16.7*  --  16.2* 17.0*  PLT 100*  --  102* 93*   Thyroid No results for input(s): TSH, FREET4 in the last 168 hours.  BNP Recent Labs  Lab 11/12/21 1838  BNP 678.8*    DDimer No results for input(s): DDIMER in the last 168 hours.   Radiology    CARDIAC CATHETERIZATION  Result Date: 11/16/2021   Hemodynamic findings consistent with moderate pulmonary hypertension. 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 Pulmonary vascular resistance 4.8 Woods units Mean right atrial pressure 16, PAPI 2.8 Cardiac output 5.2 L/min Cardiac index 2.74 L/min/m Mixed venous/main pulmonary artery O2 saturation 68% RECOMMENDATIONS: Plan Per treating team.  Right atrial pressures are elevated.  Left heart hemodynamics suggest compensated heart failure at this point.   Cardiac Studies   Echo 09/28/2019 1. Left ventricular ejection fraction, by estimation, is 55 to 60%. The  left ventricle has normal function. The left  ventricle has no regional  wall motion abnormalities. There is mild concentric left ventricular  hypertrophy. Left ventricular diastolic  parameters are consistent with Grade I diastolic dysfunction (impaired  relaxation).   2. Right ventricular systolic function is moderately reduced. The right  ventricular size is moderately enlarged. There is moderately elevated  pulmonary artery systolic pressure. The estimated right ventricular  systolic pressure is [id number] mmHg.   3. Right atrial size was moderately dilated.   4. The mitral valve is normal in structure. No evidence of mitral valve  regurgitation.   5. The aortic valve is tricuspid. Aortic valve regurgitation is moderate.  No aortic stenosis is present.   6. The inferior vena cava is dilated in size with <50% respiratory  variability, suggesting right atrial pressure of 15 mmHg.     Cath 09/29/2019 Hemodynamic findings consistent with moderate pulmonary hypertension.   Moderately elevated right heart pressures with PA pressure 63/28 and mean pressure 39 mmHg consistent with at least moderate pulmonary hypertension.   PVR: 2.9 WU   LVEDP 17 - 18 mmHg.   Normal epicardial coronary arteries.   RECOMMENDATION: Optimization of fluid status, blood pressure, and cardiac rhythm in this patient diastolic dysfunction who presented with SVT/atrial flutter.  Smoking cessation is essential.     Echo 11/13/2021 1. Left ventricular ejection fraction, by estimation, is 40 to 45%. The  left ventricle has mildly decreased function. The left ventricle  demonstrates global hypokinesis. There is severe concentric left  ventricular hypertrophy. Left ventricular diastolic   parameters are consistent with Grade II diastolic dysfunction  (pseudonormalization). There is the interventricular septum is flattened  in diastole ('D' shaped left ventricle), consistent with right ventricular  volume overload.   2. Right ventricular systolic function is  moderately reduced. The right  ventricular size is moderately enlarged. Mildly increased right  ventricular wall thickness. There is moderately elevated pulmonary artery  systolic pressure. The estimated right  ventricular systolic pressure is [id number] mmHg.   3. Left atrial size was mildly dilated.   4. Right atrial size was severely dilated.   5. A small pericardial effusion is present. No evidence of tamponade.   6. The mitral valve is grossly normal. Trivial mitral valve  regurgitation.   7. Tricuspid valve regurgitation is severe.   8. The aortic valve is tricuspid. Aortic valve regurgitation is mild to  moderate.   9. Pulmonic valve regurgitation is moderate.  10. Moderately dilated pulmonary artery.  11. The inferior vena cava is dilated in size with <50% respiratory  variability, suggesting right atrial pressure of 15 mmHg.   Comparison(s): Significant changes: worsening LVEF with torrential,  functional tricuspid regurgitation.   Conclusion(s)/Recommendation(s): Notable hypertrophy, atrial dilation, and  effusion: amyloid nuclear imaging may be indicated as outpatient.   Patient Profile     66 y.o. 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 Reesa Chew.  Assessment & Plan    Acute on chronic combined systolic and diastolic heart failure:   His RV is horrible with severe TR Surprisingly right heart done 11/16/21 not bad with PA mean 38 mmHg PCWP 13 mmHg PVR 4.8 wood units Back on diuretics azotemia improved no need for inotropes               Acute on chronic renal insufficiency: Creatinine 1.83-->1.56-->2.46->2.61->2.3 -> 1.77 ->1.43 Likely ATN from hypotension on top of CRF and low output from RV failure  Nephrology is seeing back on lasix oral 20 mg bid   EtOH and tobacco use: He smokes half a pack per day.  Per patient, he previously managed to quit drinking for many years, however recently resumed alcohol after the passing away of multiple siblings   Paroxysmal atrial flutter: resume eliquis post cath    Hypertension   History of pulmonary hypertension   Severe COPD: Followed by pulmonology service.     For questions or updates, please contact [place] Please consult [email or web address] for contact info under   Cardiology will sign off will arrange outpatient f/u with Dr Radford Pax and CHF clinic       Signed, Jenkins Rouge, MD  11/18/2021, 10:01 AM

## 2021-11-18 NOTE — Progress Notes (Addendum)
VASCULAR LAB    Patient had a departmental note from 11/16/21 that stated the renal artery duplex study could be done as an outpatient.  However, seeing that the patient was still here 11/18/21, I called Tammy Sours, RN this morning to have patient kept NPO for breakfast to do Renal artery duplex around 8:30am while patient was in the hospital.  Morton Peters my request but called soon after to tell me the patient stated he did not want to be NPO and would have the study as an outpatient.   Lavell Supple, RVT 11/18/2021, 8:07 AM

## 2021-11-18 NOTE — Assessment & Plan Note (Addendum)
--  follow-up with pulmonology  --The pulmonary hypertension 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 He would benefit from in lab sleep study after admission.  - I have counseled him on the importance of smoking cessation. He is on nicotine patch currently.  - We will determine need for pulmonary vasodilators after heart cath.

## 2021-11-18 NOTE — Progress Notes (Signed)
Subjective:  hemodynamically stable-  actually great UOP at 3300-  crt down to 1.4-  he feels good  Objective Vital signs in last 24 hours: Vitals:   11/17/21 1140 11/17/21 1354 11/17/21 2041 11/18/21 0317  BP:  136/85 112/79 136/79  Pulse:  81 91 88  Resp:  16 (!) 22 20  Temp:  97.7 F (36.5 C) 97.7 F (36.5 C) 98.1 F (36.7 C)  TempSrc:  Oral Oral Oral  SpO2: 98% 97%  94%  Weight:    73.4 kg  Height:       Weight change: -0.318 kg  Intake/Output Summary (Last 24 hours) at 11/18/2021 0818 Last data filed at 11/18/2021 0700 Gross per 24 hour  Intake 1146.38 ml  Output 3290 ml  Net -2143.62 ml    Assessment/Plan: 66 year old BM with significant pulmonary pathology and now new worsening cardiac pathology and also with A on CRF 1.Renal- patient with some baseline CKD- not entirely clear why-  renal ultrasound does show smaller right kidney so question some vascular insufficiency in the past.  Now with A on CRF I think mostly due to the hypotension brought on by diuresis but not necessarily volume depleted.  He has been given moderate dose metoprolol which can lower BP.   U/A shows red and WBC > 50 100 of protein-  quantify protein ( was low 0.2) and check culture as well ( still pending) -  I have taken the liberty of decreasing his metoprolol-  BP seems much better/higher.  Fortunately it appears that renal function is improved-  I think from better renal perfusion.   2. Hypertension/volume  - as above -  hypotension is an issue-  have decreased the metoprolol-  He seemed  still  a little volume overloaded -  resumed some diuretic and he had a great response.  Difficult to assess his SOB clinically as is also due to his COPD which seems pretty severe.  I noted that he was only on 20 lasix daily as OP-  I put him on 40 BID yesterday and he put out 3300-  held dose this AM due to that fact -  will resume lasix 20 BID starting tomorrow so just a little increase to keep him out of trouble -     Renal will sign off-   I dont think OP renal follow up is needed-  I encouraged him though to follow up with PCP and reconnect with lung and heart MDs-  call with questions     Cecille Aver    Labs: Basic Metabolic Panel: Recent Labs  Lab 11/16/21 0439 11/16/21 1226 11/16/21 2350 11/18/21 0305  NA 139 138  139 141 140  K 4.7 4.4  4.3 4.3 3.8  CL 100  --  103 100  CO2 29  --  32 34*  GLUCOSE 84  --  108* 125*  BUN 47*  --  39* 26*  CREATININE 2.31*  --  1.77* 1.43*  CALCIUM 8.8*  --  8.6* 8.5*   Liver Function Tests: No results for input(s): AST, ALT, ALKPHOS, BILITOT, PROT, ALBUMIN in the last 168 hours. No results for input(s): LIPASE, AMYLASE in the last 168 hours. No results for input(s): AMMONIA in the last 168 hours. CBC: Recent Labs  Lab 11/12/21 1838 11/13/21 0347 11/14/21 0357 11/15/21 0603 11/16/21 0439 11/16/21 1226 11/16/21 2350 11/18/21 0305  WBC 3.6* 1.8* 5.4 5.1 4.5  --  4.4 5.0  NEUTROABS 1.8 1.7  --   --   --   --   --   --  HGB 15.6 14.6 14.0 13.9 14.3 16.0  16.0 13.4 13.2  HCT 47.1 44.5 43.0 41.3 40.9 47.0  47.0 39.6 38.7*  MCV 94.6 94.7 95.3 93.4 91.1  --  91.5 92.8  PLT 110* 101* 107* 110* 100*  --  102* 93*   Cardiac Enzymes: No results for input(s): CKTOTAL, CKMB, CKMBINDEX, TROPONINI in the last 168 hours. CBG: No results for input(s): GLUCAP in the last 168 hours.  Iron Studies: No results for input(s): IRON, TIBC, TRANSFERRIN, FERRITIN in the last 72 hours. Studies/Results: CARDIAC CATHETERIZATION  Result Date: 11/16/2021   Hemodynamic findings consistent with moderate pulmonary hypertension. 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 Pulmonary vascular resistance 4.8 Woods units Mean right atrial pressure 16, PAPI 2.8 Cardiac output 5.2 L/min Cardiac index 2.74 L/min/m Mixed venous/main pulmonary artery O2 saturation 68% RECOMMENDATIONS: Plan Per treating team.  Right  atrial pressures are elevated.  Left heart hemodynamics suggest compensated heart failure at this point.  Medications: Infusions:  sodium chloride 10 mL/hr at 11/17/21 0038    Scheduled Medications:  apixaban  5 mg Oral BID   atorvastatin  20 mg Oral q1800   furosemide  40 mg Oral BID   metoprolol succinate  25 mg Oral Daily   nicotine  14 mg Transdermal Daily   sodium chloride flush  3 mL Intravenous Q12H   sodium chloride flush  3 mL Intravenous Q12H   umeclidinium-vilanterol  1 puff Inhalation Daily    have reviewed scheduled and prn medications.  Physical Exam: General:  alert NAD   Heart:RRR Lungs: poor air movement but no rales Abdomen: soft, non tender Extremities: no  LE edema   11/18/2021,8:18 AM  LOS: 5 days

## 2021-11-18 NOTE — Discharge Summary (Addendum)
Physician Discharge Summary   Patient: Aaron Fuller MRN: [id number] DOB: December 23, 1955  Admit date:     11/12/2021  Discharge date: 11/18/21  Discharge Physician: Murray Hodgkins   PCP: Wynn Banker, PA   Recommendations at discharge:   * Acute on chronic combined systolic and diastolic CHF (congestive heart failure) (HCC) --Notable hypertrophy, atrial dilation, and effusion: amyloid nuclear imaging may be indicated as outpatient.   Thrombocytopenia (HCC) --chronic, stable, mild, follow-up as an outpatient  Pulmonary hypertension ([place]) --The pulmonary hypertension 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 He would benefit from in lab sleep study after admission.  - Determine need pulmonary vasodilators as outpatient  HCV antibody positive --lab not in system; follow-up as an outpatient with Dr. Cindee Salt at [facility]  Discharge Diagnoses: Principal Problem:   Acute on chronic combined systolic and diastolic CHF (congestive heart failure) ([place]) Active Problems:   Emphysema of lung (HCC)   Chronic respiratory failure with hypoxia and hypercapnia (HCC)   COPD exacerbation ([place])   AKI ([place]) superimposed on CKD IIIA   Paroxysmal atrial flutter (HCC)   CKD stage IIIa   Alcohol abuse   Pulmonary hypertension (HCC)   Thrombocytopenia (HCC)   Tobacco dependence   HCV antibody positive  Resolved Problems:   * No resolved hospital problems. *  Hospital Course: 66 year old with history of COPD with chronic hypoxic respiratory failure on 3 L of oxygen at baseline, HFpEF, PAF, hepatitis C-who presented shortness of breath and lower extremity edema-he was initially thought to have acute on chronic HFpEF, however echocardiogram showed significant RV dysfunction-he was subsequently transferred to [facility] for [place].  Fortunately did not require inotropic support.  Renal function significantly improved, respiratory status appears to be at baseline, cleared by  cardiology and nephrology for discharge.  * Acute on chronic combined systolic and diastolic CHF (congestive heart failure) (HCC) --continue BB, Lasix, stain --per cardiology no need for inotropes --Notable hypertrophy, atrial dilation, and effusion: amyloid nuclear imaging may be indicated as outpatient.   AKI ([place]) superimposed on CKD IIIA --followed by nephrology, thought secondary to hypotension from diuresis; BB adjusted --renal function improved, likely back to baseline; can follow-up with PCP per nephrology  Chronic respiratory failure with hypoxia and hypercapnia (HCC) --baseline 3L  Emphysema of lung (HCC) --stable  CKD stage IIIa --baseline creatinine around 1.5  Paroxysmal atrial flutter (HCC) --continue apixaban, BB  Thrombocytopenia (HCC) --chronic, stable, mild, follow-up as an outpatient  Pulmonary hypertension ([place]) --follow-up with pulmonology  --The pulmonary hypertension 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 He would benefit from in lab sleep study after admission.  - Counseled him on the importance of smoking cessation. He is on nicotine patch currently.  - Determine need pulmonary vasodilators as outpatient  Alcohol abuse --no evidence of withdrawal. Multiple siblings passed away several months back-he apparently quit taking his medications and drank quite a bit of alcohol-he apparently has been abstinent for 2 months.   Tobacco dependence --recommend cessation  HCV antibody positive --lab not in system; follow-up as an outpatient with Dr. Cindee Salt at [facility]        Consultants:  Cardiology Nephrology Pulmonology  Procedures performed: [place]  Right heart cath completed from right antecubital. Mean PA pressure 38 mmHg Mean wedge pressure 13 mmHg Pulmonary vascular resistance 4.8 Woods units Elevated mean right atrial pressure =16 mmHg. PAPI = 2.8 Disposition: Home Diet recommendation:  Discharge Diet Orders  (From admission, onward)  Start     Ordered   11/18/21 0000  Diet - low sodium heart healthy        11/18/21 1231           Cardiac diet DISCHARGE MEDICATION: Allergies as of 11/18/2021   No Active Allergies      Medication List     STOP taking these medications    fluticasone-salmeterol 115-21 MCG/ACT inhaler Commonly known as: ADVAIR HFA   Spiriva Respimat 2.5 MCG/ACT Aers Generic drug: Tiotropium Bromide Monohydrate       TAKE these medications    albuterol 108 (90 Base) MCG/ACT inhaler Commonly known as: VENTOLIN HFA Inhale 2 puffs into the lungs every 6 (six) hours as needed for wheezing or shortness of breath.   apixaban 5 MG Tabs tablet Commonly known as: ELIQUIS Take 1 tablet (5 mg total) by mouth 2 (two) times daily.   atorvastatin 20 MG tablet Commonly known as: LIPITOR Take 1 tablet (20 mg total) by mouth daily at 6 PM. What changed: when to take this   folic acid 1 MG tablet Commonly known as: FOLVITE Take 1 tablet (1 mg total) by mouth daily.   furosemide 20 MG tablet Commonly known as: LASIX Take 1 tablet (20 mg total) by mouth 2 (two) times daily. What changed: when to take this   metoprolol succinate 25 MG 24 hr tablet Commonly known as: TOPROL-XL Take 1 tablet (25 mg total) by mouth daily. Start taking on: Nov 19, 2021 What changed:  medication strength how much to take additional instructions Another medication with the same name was removed. Continue taking this medication, and follow the directions you see here.   multivitamin with minerals Tabs tablet Take 1 tablet by mouth daily.   nicotine 14 mg/24hr patch Commonly known as: NICODERM CQ - dosed in mg/24 hours Place 1 patch (14 mg total) onto the skin daily.   thiamine 100 MG tablet Take 1 tablet (100 mg total) by mouth daily.   umeclidinium-vilanterol 62.5-25 MCG/ACT Aepb Commonly known as: ANORO ELLIPTA Inhale 1 puff into the lungs daily. Start taking on: Nov 19, 2021        Follow-up Information     Freddi Starr, MD Follow up in 2 week(s).   Specialty: Pulmonary Disease Why: We will call you with appt date/time. For breathing issues, lung doctor Contact information: [address] [place] [address] [place] [address] [phone]         Sueanne Margarita, MD Follow up.   Specialty: Cardiology Why: Office will call you with an appointment Contact information: [id number] [address] [phone]         Eusebio Friendly, MD. Schedule an appointment as soon as possible for a visit in 2 week(s).   Specialty: Internal Medicine Contact information: [address] [place] [address] [place] [address] [phone]                Feels good, breathing fine  Discharge Exam: Filed Weights   11/16/21 0433 11/17/21 0500 11/18/21 0317  Weight: 75.2 kg 73.7 kg 73.4 kg   Physical Exam Vitals reviewed.  Constitutional:      General: He is not in acute distress.    Appearance: He is not ill-appearing or toxic-appearing.  Cardiovascular:     Rate and Rhythm: Normal rate and regular rhythm.     Heart sounds: No murmur heard. Pulmonary:     Effort: Pulmonary effort is normal. No  respiratory distress.     Breath sounds: No wheezing, rhonchi or rales.  Neurological:     Mental Status: He is alert.  Psychiatric:        Mood and Affect: Mood normal.        Behavior: Behavior normal.     Condition at discharge: good  The results of significant diagnostics from this hospitalization (including imaging, microbiology, ancillary and laboratory) are listed below for reference.   Imaging Studies: CARDIAC CATHETERIZATION  Result Date: 11/16/2021   Hemodynamic findings consistent with moderate pulmonary hypertension. 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 Pulmonary vascular resistance 4.8 Woods units Mean right atrial pressure 16, PAPI  2.8 Cardiac output 5.2 L/min Cardiac index 2.74 L/min/m Mixed venous/main pulmonary artery O2 saturation 68% RECOMMENDATIONS: Plan Per treating team.  Right atrial pressures are elevated.  Left heart hemodynamics suggest compensated heart failure at this point.  US RENAL  Result Date: 11/15/2021 CLINICAL DATA:  Acute kidney injury. EXAM: RENAL / URINARY TRACT ULTRASOUND COMPLETE COMPARISON:  October 01, 2019. FINDINGS: Right Kidney: Renal measurements: 12.0 x 4.9 x 4.6 cm = volume: 140 mL. Increased echogenicity of renal parenchyma is noted. No mass or hydronephrosis visualized. Left Kidney: Renal measurements: 9.1 x 5.1 x 3.7 cm = volume: 90 mL. Increased echogenicity of renal parenchyma is noted. No mass or hydronephrosis visualized. Bladder: Appears normal for degree of bladder distention. Other: Mild ascites is noted in the abdomen. IMPRESSION: Increased echogenicity of renal parenchyma is noted bilaterally suggesting medical renal disease. Mild left renal atrophy is noted. No hydronephrosis or renal obstruction is noted. Mild ascites. Electronically Signed   By: Marijo Conception M.D.   On: 11/15/2021 08:49   DG Chest Port 1 View  Result Date: 11/12/2021 CLINICAL DATA:  Shortness of breath EXAM: PORTABLE CHEST 1 VIEW COMPARISON:  10/01/2019 FINDINGS: The heart is slightly enlarged. No focal opacity, pleural effusion, or pneumothorax. Emphysema. IMPRESSION: 1. Hyperinflation with emphysema 2. Mild cardiomegaly. Electronically Signed   By: Donavan Foil M.D.   On: 11/12/2021 19:17   ECHOCARDIOGRAM COMPLETE  Result Date: 11/13/2021    ECHOCARDIOGRAM REPORT   Patient Name:   RIKER FLEMMING Date of Exam: 11/13/2021 Medical Rec #:  [id number]  Height:       68.5 in Accession #:    [id number] Weight:       162.5 lb Date of Birth:  06/25/1955  BSA:          1.881 m Patient Age:    66 years   BP:           128/89 mmHg Patient Gender: M          HR:           89 bpm. Exam Location:  Inpatient Procedure: 2D Echo,  Cardiac Doppler and Color Doppler Indications:    Cardiomyopathy  History:        Patient has prior history of Echocardiogram examinations, most                 recent 09/28/2019. CHF, COPD; Arrythmias:Tachycardia.  Sonographer:    Jefferey Pica Referring Phys: [id number] ANKIT CHIRAG AMIN IMPRESSIONS  1. Left ventricular ejection fraction, by estimation, is 40 to 45%. The left ventricle has mildly decreased function. The left ventricle demonstrates global hypokinesis. There is severe concentric left ventricular hypertrophy. Left ventricular diastolic  parameters are consistent with Grade II diastolic dysfunction (pseudonormalization). There is the interventricular septum is flattened  in diastole ('D' shaped left ventricle), consistent with right ventricular volume overload.  2. Right ventricular systolic function is moderately reduced. The right ventricular size is moderately enlarged. Mildly increased right ventricular wall thickness. There is moderately elevated pulmonary artery systolic pressure. The estimated right ventricular systolic pressure is [id number] mmHg.  3. Left atrial size was mildly dilated.  4. Right atrial size was severely dilated.  5. A small pericardial effusion is present. No evidence of tamponade.  6. The mitral valve is grossly normal. Trivial mitral valve regurgitation.  7. Tricuspid valve regurgitation is severe.  8. The aortic valve is tricuspid. Aortic valve regurgitation is mild to moderate.  9. Pulmonic valve regurgitation is moderate. 10. Moderately dilated pulmonary artery. 11. The inferior vena cava is dilated in size with <50% respiratory variability, suggesting right atrial pressure of 15 mmHg. Comparison(s): Significant changes: worsening LVEF with torrential, functional tricuspid regurgitation. Conclusion(s)/Recommendation(s): Notable hypertrophy, atrial dilation, and effusion: amyloid nuclear imaging may be indicated as outpatient. FINDINGS  Left Ventricle: Left ventricular ejection  fraction, by estimation, is 40 to 45%. The left ventricle has mildly decreased function. The left ventricle demonstrates global hypokinesis. The left ventricular internal cavity size was small. There is severe concentric left ventricular hypertrophy. The interventricular septum is flattened in diastole ('D' shaped left ventricle), consistent with right ventricular volume overload. Left ventricular diastolic parameters are consistent with Grade II diastolic dysfunction (pseudonormalization). Right Ventricle: The right ventricular size is moderately enlarged. Mildly increased right ventricular wall thickness. Right ventricular systolic function is moderately reduced. There is moderately elevated pulmonary artery systolic pressure. The tricuspid regurgitant velocity is 3.03 m/s, and with an assumed right atrial pressure of 15 mmHg, the estimated right ventricular systolic pressure is [id number] mmHg. Left Atrium: Left atrial size was mildly dilated. Right Atrium: Right atrial size was severely dilated. Pericardium: A small pericardial effusion is present. Mitral Valve: The mitral valve is grossly normal. Trivial mitral valve regurgitation. Tricuspid Valve: The tricuspid valve is normal in structure. Tricuspid valve regurgitation is severe. Aortic Valve: The aortic valve is tricuspid. Aortic valve regurgitation is mild to moderate. Aortic regurgitation PHT measures 667 msec. Aortic valve peak gradient measures 8.1 mmHg. Pulmonic Valve: The pulmonic valve was normal in structure. Pulmonic valve regurgitation is moderate. No evidence of pulmonic stenosis. Aorta: The aortic root and ascending aorta are structurally normal, with no evidence of dilitation. Pulmonary Artery: The pulmonary artery is moderately dilated. Venous: The inferior vena cava is dilated in size with less than 50% respiratory variability, suggesting right atrial pressure of 15 mmHg. IAS/Shunts: There is right bowing of the interatrial septum, suggestive of  elevated left atrial pressure. No atrial level shunt detected by color flow Doppler.  LEFT VENTRICLE PLAX 2D LVIDd:         3.15 cm      Diastology LVIDs:         2.60 cm      LV e' medial:    7.54 cm/s LV PW:         2.05 cm      LV E/e' medial:  4.5 LV IVS:        1.82 cm      LV e' lateral:   7.51 cm/s LVOT diam:     1.80 cm      LV E/e' lateral: 4.5 LV SV:         55 LV SV Index:   29 LVOT Area:     2.54 cm  LV Volumes (MOD) LV  vol d, MOD A4C: 119.0 ml LV vol s, MOD A4C: 84.2 ml LV SV MOD A4C:     119.0 ml RIGHT VENTRICLE          IVC RV Basal diam:  4.40 cm  IVC diam: 3.10 cm RV Mid diam:    5.00 cm TAPSE (M-mode): 1.6 cm LEFT ATRIUM             Index        RIGHT ATRIUM           Index LA diam:        4.40 cm 2.34 cm/m   RA Area:     30.60 cm LA Vol (A2C):   69.3 ml 36.84 ml/m  RA Volume:   131.00 ml 69.64 ml/m LA Vol (A4C):   47.7 ml 25.36 ml/m LA Biplane Vol: 60.0 ml 31.89 ml/m  AORTIC VALVE                 PULMONIC VALVE AV Area (Vmax): 2.51 cm     PV Vmax:       0.90 m/s AV Vmax:        142.00 cm/s  PV Peak grad:  3.2 mmHg AV Peak Grad:   8.1 mmHg LVOT Vmax:      140.00 cm/s LVOT Vmean:     79.400 cm/s LVOT VTI:       0.218 m AI PHT:         667 msec  AORTA Ao Asc diam: 3.50 cm MITRAL VALVE               TRICUSPID VALVE MV Area (PHT): 5.16 cm    TR Peak grad:   36.7 mmHg MV Decel Time: 147 msec    TR Vmax:        303.00 cm/s MV E velocity: 33.80 cm/s MV A velocity: 75.00 cm/s  SHUNTS MV E/A ratio:  0.45        Systemic VTI:  0.22 m                            Systemic Diam: 1.80 cm Rudean Haskell MD Electronically signed by Rudean Haskell MD Signature Date/Time: 11/13/2021/12:40:01 PM    Final    VAS [place] LOWER EXTREMITY VENOUS (DVT)  Result Date: 11/14/2021  Lower Venous DVT Study Patient Name:  LEONIDAS DUNSTAN  Date of Exam:   11/14/2021 Medical Rec #: [id number]   Accession #:    [id number] Date of Birth: January 30, 1956   Patient Gender: M Patient Age:   49 years Exam Location:  [facility] Procedure:      VAS [place] LOWER EXTREMITY VENOUS (DVT) Referring Phys: Freda Jackson --------------------------------------------------------------------------------  Indications: Edema.  Risk Factors: None identified. Limitations: Poor ultrasound/tissue interface. Comparison Study: No prior studies. Performing Technologist: Oliver Hum RVT  Examination Guidelines: A complete evaluation includes B-mode imaging, spectral Doppler, color Doppler, and power Doppler as needed of all accessible portions of each vessel. Bilateral testing is considered an integral part of a complete examination. Limited examinations for reoccurring indications may be performed as noted. The reflux portion of the exam is performed with the patient in reverse Trendelenburg.  +---------+---------------+---------+-----------+----------+--------------+ RIGHT    CompressibilityPhasicitySpontaneityPropertiesThrombus Aging +---------+---------------+---------+-----------+----------+--------------+ CFV      Full           Yes      No                                  +---------+---------------+---------+-----------+----------+--------------+  SFJ      Full                                                        +---------+---------------+---------+-----------+----------+--------------+ FV Prox  Full                                                        +---------+---------------+---------+-----------+----------+--------------+ FV Mid   Full                                                        +---------+---------------+---------+-----------+----------+--------------+ FV DistalFull                                                        +---------+---------------+---------+-----------+----------+--------------+ PFV      Full                                                        +---------+---------------+---------+-----------+----------+--------------+ POP      Full           Yes       No                                  +---------+---------------+---------+-----------+----------+--------------+ PTV      Full                                                        +---------+---------------+---------+-----------+----------+--------------+ PERO     Full                                                        +---------+---------------+---------+-----------+----------+--------------+   +---------+---------------+---------+-----------+----------+--------------+ LEFT     CompressibilityPhasicitySpontaneityPropertiesThrombus Aging +---------+---------------+---------+-----------+----------+--------------+ CFV      Full           Yes      No                                  +---------+---------------+---------+-----------+----------+--------------+ SFJ      Full                                                        +---------+---------------+---------+-----------+----------+--------------+  FV Prox  Full                                                        +---------+---------------+---------+-----------+----------+--------------+ FV Mid   Full                                                        +---------+---------------+---------+-----------+----------+--------------+ FV DistalFull                                                        +---------+---------------+---------+-----------+----------+--------------+ PFV      Full                                                        +---------+---------------+---------+-----------+----------+--------------+ POP      Full           Yes      No                                  +---------+---------------+---------+-----------+----------+--------------+ PTV      Full                                                        +---------+---------------+---------+-----------+----------+--------------+ PERO     Full                                                         +---------+---------------+---------+-----------+----------+--------------+     Summary: RIGHT: - There is no evidence of deep vein thrombosis in the lower extremity.  - No cystic structure found in the popliteal fossa.  LEFT: - There is no evidence of deep vein thrombosis in the lower extremity.  - No cystic structure found in the popliteal fossa.  *See table(s) above for measurements and observations. Electronically signed by Coral Else MD on 11/14/2021 at 8:54:21 PM.    Final     Microbiology: Results for orders placed or performed during the hospital encounter of 11/12/21  Resp Panel by RT-PCR (Flu A&B, Covid) Nasopharyngeal Swab     Status: None   Collection Time: 11/12/21  6:38 PM   Specimen: Nasopharyngeal Swab; Nasopharyngeal(NP) swabs in vial transport medium  Result Value Ref Range Status   SARS Coronavirus 2 by RT PCR NEGATIVE NEGATIVE Final    Comment: (NOTE) SARS-CoV-2 target nucleic acids are NOT DETECTED.  The SARS-CoV-2 RNA is generally detectable in upper respiratory specimens during the acute phase of  infection. The lowest concentration of SARS-CoV-2 viral copies this assay can detect is 138 copies/mL. A negative result does not preclude SARS-Cov-2 infection and should not be used as the sole basis for treatment or other patient management decisions. A negative result may occur with  improper specimen collection/handling, submission of specimen other than nasopharyngeal swab, presence of viral mutation(s) within the areas targeted by this assay, and inadequate number of viral copies(<138 copies/mL). A negative result must be combined with clinical observations, patient history, and epidemiological information. The expected result is Negative.  Fact Sheet for Patients:  EntrepreneurPulse.com.au  Fact Sheet for Healthcare Providers:  IncredibleEmployment.be  This test is no t yet approved or cleared by the [place] FDA and  has  been authorized for detection and/or diagnosis of SARS-CoV-2 by FDA under an Emergency Use Authorization (EUA). This EUA will remain  in effect (meaning this test can be used) for the duration of the COVID-19 declaration under Section 564(b)(1) of the Act, 21 U.S.C.section 360bbb-3(b)(1), unless the authorization is terminated  or revoked sooner.       Influenza A by PCR NEGATIVE NEGATIVE Final   Influenza B by PCR NEGATIVE NEGATIVE Final    Comment: (NOTE) The Xpert Xpress SARS-CoV-2/FLU/RSV plus assay is intended as an aid in the diagnosis of influenza from Nasopharyngeal swab specimens and should not be used as a sole basis for treatment. Nasal washings and aspirates are unacceptable for Xpert Xpress SARS-CoV-2/FLU/RSV testing.  Fact Sheet for Patients: EntrepreneurPulse.com.au  Fact Sheet for Healthcare Providers: IncredibleEmployment.be  This test is not yet approved or cleared by the [place] FDA and has been authorized for detection and/or diagnosis of SARS-CoV-2 by FDA under an Emergency Use Authorization (EUA). This EUA will remain in effect (meaning this test can be used) for the duration of the COVID-19 declaration under Section 564(b)(1) of the Act, 21 U.S.C. section 360bbb-3(b)(1), unless the authorization is terminated or revoked.  Performed at [facility], [address] [place], [place] [address]   Urine Culture     Status: Abnormal   Collection Time: 11/16/21  6:20 PM   Specimen: Urine, Clean Catch  Result Value Ref Range Status   Specimen Description URINE, CLEAN CATCH  Final   Special Requests   Final    NONE Performed at [facility], [place] [address], [place], [place] [address]    Culture >=100,000 COLONIES/mL AEROCOCCUS URINAE (A)  Final   Report Status 11/18/2021 FINAL  Final    Labs: CBC: Recent Labs  Lab 11/12/21 1838 11/13/21 0347 11/14/21 0357 11/15/21 0603 11/16/21 0439  11/16/21 1226 11/16/21 2350 11/18/21 0305  WBC 3.6* 1.8* 5.4 5.1 4.5  --  4.4 5.0  NEUTROABS 1.8 1.7  --   --   --   --   --   --   HGB 15.6 14.6 14.0 13.9 14.3 16.0  16.0 13.4 13.2  HCT 47.1 44.5 43.0 41.3 40.9 47.0  47.0 39.6 38.7*  MCV 94.6 94.7 95.3 93.4 91.1  --  91.5 92.8  PLT 110* 101* 107* 110* 100*  --  102* 93*   Basic Metabolic Panel: Recent Labs  Lab 11/14/21 0357 11/15/21 0603 11/16/21 0439 11/16/21 1226 11/16/21 2350 11/18/21 0305  NA 138 137 139 138  139 141 140  K 4.5 4.5 4.7 4.4  4.3 4.3 3.8  CL 99 97* 100  --  103 100  CO2 32 32 29  --  32 34*  GLUCOSE 103*  91 84  --  108* 125*  BUN 34* 40* 47*  --  39* 26*  CREATININE 2.46* 2.61* 2.31*  --  1.77* 1.43*  CALCIUM 8.6* 8.9 8.8*  --  8.6* 8.5*  MG 1.7 1.6* 1.8  --  1.6* 1.4*   Liver Function Tests: No results for input(s): AST, ALT, ALKPHOS, BILITOT, PROT, ALBUMIN in the last 168 hours. CBG: No results for input(s): GLUCAP in the last 168 hours.  Discharge time spent: greater than 30 minutes.  Signed: Murray Hodgkins, MD [facility] 11/18/2021

## 2021-11-20 NOTE — Telephone Encounter (Signed)
CLVM to call back,and schedule 2-3 week follow up- FE

## 2021-11-22 ENCOUNTER — Ambulatory Visit: Payer: Medicare Other | Admitting: Cardiology

## 2021-11-23 ENCOUNTER — Ambulatory Visit: Payer: Medicare Other | Admitting: Cardiology

## 2021-11-25 NOTE — Progress Notes (Deleted)
Cardiology Office Note:    Date:  11/25/2021   ID:  [name], DOB [birth year], MRN [id number]  PCP:  Wynn Banker, PA  [facility] Providers Cardiologist:  Fransico Him, MD { Click to update primary MD,subspecialty MD or APP then REFRESH:1}  *** Referring MD: Wynn Banker*   Chief Complaint:  No chief complaint on file. 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 Pulmonary vascular resistance 4.8 Woods units Mean right atrial pressure 16, PAPI 2.8 Cardiac output 5.2 L/min Cardiac index 2.74 L/min/m Mixed venous/main pulmonary artery O2 saturation 68% RECOMMENDATIONS: Plan Per treating team.  Right atrial pressures are elevated.  Left heart hemodynamics suggest compensated heart failure at this point.   ECHO COMPLETE WO IMAGING ENHANCING AGENT 11/13/2021 EF 40-45, global HK, severe concentric LVH, GRII DD, D-shaped LV consistent with RV volume overload, moderately reduced RVSF, moderately elevated PASP, RVSP 51.7, mild LAE, severe RAE, small pericardial effusion, trivial MR, severe TR, mild to moderate AI, moderate PI, moderately dilated PA [notable hypertrophy, atrial dilation and effusion-amyloid nuclear imaging may be indicated as an  outpatient]  1. Left ventricular ejection fraction, by estimation, is 40 to 45%. The left ventricle has mildly decreased function. The left ventricle demonstrates global hypokinesis. There is severe concentric left ventricular hypertrophy. Left ventricular diastolic parameters are consistent with Grade II diastolic dysfunction (pseudonormalization). There is the interventricular septum is flattened in diastole ('D' shaped left ventricle), consistent with right ventricular volume overload. 2. Right ventricular systolic function is moderately reduced. The right ventricular size is moderately enlarged. Mildly increased right ventricular wall thickness. There is moderately elevated pulmonary artery systolic pressure. The estimated right ventricular systolic pressure is [id number] mmHg. 3. Left atrial size was mildly dilated. 4. Right atrial size was severely dilated. 5. A small pericardial effusion is present. No evidence of tamponade. 6. The mitral valve is grossly normal. Trivial mitral valve regurgitation. 7. Tricuspid valve regurgitation is severe. 8. The aortic valve is tricuspid. Aortic valve regurgitation is mild to moderate. 9. Pulmonic valve regurgitation is moderate. 10. Moderately dilated pulmonary artery. 11. The inferior vena cava is dilated in size with <50% respiratory variability, suggesting right atrial pressure of 15 mmHg.  Conclusion(s)/Recommendation(s): Notable hypertrophy, atrial dilation, and effusion: amyloid nuclear imaging may be indicated as outpatient.  Echocardiogram 09/28/2019 EF 55-60, moderately reduced RVSF, moderately elevated PASP, RVSP 54.7  Cardiac catheterization 09/29/2019 Moderately elevated right heart pressures with PA pressure 63/28 and mean pressure 39 mmHg consistent with at least moderate pulmonary hypertension. PVR: 2.9 WU  LVEDP 17 - 18 mmHg.  Normal epicardial coronary arteries. RECOMMENDATION: Optimization of fluid status, blood pressure, and cardiac  rhythm in this patient diastolic dysfunction who presented with SVT/atrial flutter.  Smoking cessation is essential.    History of Present Illness:   Aaron Fuller is a 66 y.o. male with the above problem list.  He  was last seen by Dr. Mayford Knife in January 2022.  He was admitted 5/23-5/28 with decompensated heart failure.  He had recently stopped all cardiac meds for several months prior to admission (grieving over the passing of several siblings).  Echocardiogram demonstrated reduced LV function with an EF of 40-45, moderately reduced RVSF, moderately elevated PASP, RVSP 51.7 and severe TR.  He had worsening creatinine with diuresis.  He was followed by nephrology as well.  He ultimately underwent right heart catheterization.  His left heart hemodynamics at that point suggest decompensated heart failure.  He did have evidence of moderate pulmonary hypertension likely WHO group 3 or combined group 2 and 3.      He returns for follow-up.***    Past Medical History:  Diagnosis Date   Alcohol dependence (HCC)    Chronic diastolic CHF (congestive heart failure) (HCC)    normal coronary arteries on cath   COPD (chronic obstructive pulmonary disease) (HCC)    Hypertension    Paroxysmal atrial flutter (HCC)    Pulmonary HTN (HCC)    secondary to COPD   Tobacco dependence    Current Medications: No outpatient medications have been marked as taking for the 11/26/21 encounter (Appointment) with Tereso Newcomer T, PA-C.    Allergies:   Patient has no active allergies.   Social History   Tobacco Use   Smoking status: Every Day    Packs/day: 1.00    Years: 49.00    Pack years: 49.00    Types: Cigarettes   Smokeless tobacco: Never   Tobacco comments:    currently 0.5  Vaping Use   Vaping Use: Never used  Substance Use Topics   Alcohol use: Not Currently   Drug use: Not Currently    Family Hx: The patient's family history includes CAD in his father and mother; Diabetes in his mother and sister;  Heart failure in his mother.  ROS   EKGs/Labs/Other Test Reviewed:    EKG:  EKG is *** ordered today.  The ekg ordered today demonstrates ***  Recent Labs: 11/12/2021: B Natriuretic Peptide 678.8 11/18/2021: BUN 26; Creatinine, Ser 1.43; Hemoglobin 13.2; Magnesium 1.4; Platelets 93; Potassium 3.8; Sodium 140   Recent Lipid Panel No results for input(s): CHOL, TRIG, HDL, VLDL, LDLCALC, LDLDIRECT in the last 8760 hours.   Risk Assessment/Calculations:   {Does this patient have ATRIAL FIBRILLATION?:[phone]}     Physical Exam:    VS:  There were no vitals taken for this visit.    Wt Readings from Last 3 Encounters:  11/18/21 161 lb 12.8 oz (73.4 kg)  08/23/20 171 lb 4 oz (77.7 kg)  07/06/20 170 lb (77.1 kg)    Physical Exam ***     ASSESSMENT & PLAN:   No problem-specific Assessment & Plan notes found for this encounter.        {Are you ordering a CV Procedure (e.g. stress test, cath, DCCV, TEE, etc)?   Press F2        :[id number]}  Dispo:  No follow-ups on file.   Medication Adjustments/Labs and Tests Ordered: Current medicines are reviewed at length with the patient today.  Concerns regarding medicines are outlined above.  Tests Ordered: No orders of the defined types were placed in this encounter.  Medication Changes: No orders of the defined types were placed in this encounter.  Signed, Tereso Newcomer, PA-C  11/25/2021 2:13 PM    [facility] [address] [place], [place], [place]  [address] Phone: [phone];  Fax: [phone]  

## 2021-11-26 ENCOUNTER — Ambulatory Visit: Payer: Medicare Other | Admitting: Physician Assistant

## 2021-11-26 DIAGNOSIS — N1831 Chronic kidney disease, stage 3a: Secondary | ICD-10-CM

## 2021-11-26 DIAGNOSIS — I272 Pulmonary hypertension, unspecified: Secondary | ICD-10-CM

## 2021-11-26 DIAGNOSIS — I10 Essential (primary) hypertension: Secondary | ICD-10-CM

## 2021-11-26 DIAGNOSIS — I50814 Right heart failure due to left heart failure: Secondary | ICD-10-CM

## 2021-11-26 DIAGNOSIS — J439 Emphysema, unspecified: Secondary | ICD-10-CM

## 2021-11-26 DIAGNOSIS — I4892 Unspecified atrial flutter: Secondary | ICD-10-CM

## 2021-12-13 ENCOUNTER — Ambulatory Visit: Payer: Medicare Other | Admitting: Pulmonary Disease

## 2021-12-25 DIAGNOSIS — G4734 Idiopathic sleep related nonobstructive alveolar hypoventilation: Secondary | ICD-10-CM | POA: Insufficient documentation

## 2021-12-25 NOTE — Progress Notes (Deleted)
Cardiology Office Note:    Date:  12/25/2021   ID:  Aaron Fuller, DOB 17-May-1956, MRN [id number]  PCP:  Loura Pardon, PA  [facility] Providers Cardiologist:  Armanda Magic, MD { Click to update primary MD,subspecialty MD or APP then REFRESH:1}  *** Referring MD: Loura Pardon*   Chief Complaint:  No chief complaint on file. 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 Pulmonary vascular resistance 4.8 Woods units Mean right atrial pressure 16, PAPI 2.8 Cardiac output 5.2 L/min Cardiac index 2.74 L/min/m Mixed venous/main pulmonary artery O2 saturation 68%   ECHO COMPLETE WO IMAGING ENHANCING AGENT 11/13/2021 EF 40-45, global HK, severe concentric LVH, GRII DD, D-shaped LV consistent with RV volume overload, moderately reduced RVSF, moderately elevated PASP, RVSP 51.7, mild LAE, severe RAE, small pericardial effusion, trivial MR, severe TR, mild to moderate AI, moderate PI, moderately dilated PA [notable hypertrophy, atrial dilation and effusion-amyloid nuclear imaging may be indicated as an outpatient]  1. Left ventricular ejection fraction, by estimation, is 40 to 45%. The left ventricle has mildly decreased function. The left ventricle  demonstrates global hypokinesis. There is severe concentric left ventricular hypertrophy. Left ventricular diastolic parameters are consistent with Grade II diastolic dysfunction (pseudonormalization). There is the interventricular septum is flattened in diastole ('D' shaped left ventricle), consistent with right ventricular volume overload. 2. Right ventricular systolic function is moderately reduced. The right ventricular size is moderately enlarged. Mildly increased right ventricular wall thickness. There is moderately elevated pulmonary artery systolic pressure. The estimated right ventricular systolic pressure is 51.7 mmHg. 3. Left atrial size was mildly dilated. 4. Right atrial size was severely dilated. 5. A small pericardial effusion is present. No evidence of tamponade. 6. The mitral valve is grossly normal. Trivial mitral valve regurgitation. 7. Tricuspid valve regurgitation is severe. 8. The aortic valve is tricuspid. Aortic valve regurgitation is mild to moderate. 9. Pulmonic valve regurgitation is moderate. 10. Moderately dilated pulmonary artery. 11. The inferior vena cava is dilated in size with <50% respiratory variability, suggesting right atrial pressure of 15 mmHg.  Conclusion(s)/Recommendation(s): Notable hypertrophy, atrial dilation, and effusion: amyloid nuclear imaging may be indicated as outpatient.  Echocardiogram 09/28/2019 EF 55-60, moderately reduced RVSF, moderately elevated PASP, RVSP 54.7  Cardiac catheterization 09/29/2019 Moderately elevated right heart pressures with PA pressure 63/28 and mean pressure 39 mmHg consistent with at least moderate pulmonary hypertension. PVR: 2.9 WU  LVEDP 17 - 18 mmHg.  Normal epicardial coronary arteries.   History of Present Illness:   Aaron Fuller is a 66 y.o. male with the above problem list.  He was last seen by Dr. Mayford Knife in January 2022.  He was admitted 5/23-5/28 with decompensated heart failure.  He had recently stopped  all cardiac meds for several months prior to admission (grieving over the passing of several siblings).  Echocardiogram demonstrated reduced LV function with an EF  of 40-45, moderately reduced RVSF, moderately elevated PASP, RVSP 51.7 and severe TR.  He had worsening creatinine with diuresis.  He was followed by nephrology as well.  He ultimately underwent right heart catheterization.  His left heart hemodynamics at that point suggest compensated heart failure.  He did have evidence of moderate pulmonary hypertension likely WHO group 3 or combined group 2 and 3.      He returns for follow-up.***    Past Medical History:  Diagnosis Date   Alcohol dependence (HCC)    Chronic diastolic CHF (congestive heart failure) (HCC)    normal coronary arteries on cath   COPD (chronic obstructive pulmonary disease) (HCC)    Hypertension    Paroxysmal atrial flutter (HCC)    Pulmonary HTN (HCC)    secondary to COPD   Tobacco dependence    Current Medications: No outpatient medications have been marked as taking for the 12/26/21 encounter (Appointment) with Tereso Newcomer T, PA-C.    Allergies:   Patient has no active allergies.   Social History   Tobacco Use   Smoking status: Every Day    Packs/day: 1.00    Years: 49.00    Total pack years: 49.00    Types: Cigarettes   Smokeless tobacco: Never   Tobacco comments:    currently 0.5  Vaping Use   Vaping Use: Never used  Substance Use Topics   Alcohol use: Not Currently   Drug use: Not Currently    Family Hx: The patient's family history includes CAD in his father and mother; Diabetes in his mother and sister; Heart failure in his mother.  ROS   EKGs/Labs/Other Test Reviewed:    EKG:  EKG is *** ordered today.  The ekg ordered today demonstrates ***  Recent Labs: 11/12/2021: B Natriuretic Peptide 678.8 11/18/2021: BUN 26; Creatinine, Ser 1.43; Hemoglobin 13.2; Magnesium 1.4; Platelets 93; Potassium 3.8; Sodium 140   Recent Lipid Panel No  results for input(s): "CHOL", "TRIG", "HDL", "VLDL", "LDLCALC", "LDLDIRECT" in the last 8760 hours.   Risk Assessment/Calculations:   {Does this patient have ATRIAL FIBRILLATION?:[phone]}     Physical Exam:    VS:  There were no vitals taken for this visit.    Wt Readings from Last 3 Encounters:  11/18/21 161 lb 12.8 oz (73.4 kg)  08/23/20 171 lb 4 oz (77.7 kg)  07/06/20 170 lb (77.1 kg)    Physical Exam ***     ASSESSMENT & PLAN:   No problem-specific Assessment & Plan notes found for this encounter.        {Are you ordering a CV Procedure (e.g. stress test, cath, DCCV, TEE, etc)?   Press F2        :[id number]}  Dispo:  No follow-ups on file.   Medication Adjustments/Labs and Tests Ordered: Current medicines are reviewed at length with the patient today.  Concerns regarding medicines are outlined above.  Tests Ordered: No orders of the defined types were placed in this encounter.  Medication Changes: No orders of the defined types were placed in this encounter.  Signed, Tereso Newcomer, PA-C  12/25/2021 2:44 PM    [facility] [address] [place], [place], [place]  [address] Phone: [phone]; Fax: [phone]

## 2021-12-26 ENCOUNTER — Ambulatory Visit: Payer: Medicare Other | Admitting: Physician Assistant

## 2021-12-26 DIAGNOSIS — I10 Essential (primary) hypertension: Secondary | ICD-10-CM

## 2021-12-26 DIAGNOSIS — N1831 Chronic kidney disease, stage 3a: Secondary | ICD-10-CM

## 2021-12-26 DIAGNOSIS — I4892 Unspecified atrial flutter: Secondary | ICD-10-CM

## 2021-12-26 DIAGNOSIS — J439 Emphysema, unspecified: Secondary | ICD-10-CM

## 2021-12-26 DIAGNOSIS — I272 Pulmonary hypertension, unspecified: Secondary | ICD-10-CM

## 2021-12-26 DIAGNOSIS — I50814 Right heart failure due to left heart failure: Secondary | ICD-10-CM

## 2021-12-26 DIAGNOSIS — G4734 Idiopathic sleep related nonobstructive alveolar hypoventilation: Secondary | ICD-10-CM

## 2022-04-03 ENCOUNTER — Telehealth: Payer: Self-pay | Admitting: Pulmonary Disease

## 2022-04-03 DIAGNOSIS — J439 Emphysema, unspecified: Secondary | ICD-10-CM

## 2022-04-03 MED ORDER — ALBUTEROL SULFATE HFA 108 (90 BASE) MCG/ACT IN AERS: 2.0000 | INHALATION_SPRAY | Freq: Four times a day (QID) | RESPIRATORY_TRACT | 0 refills | Status: AC | PRN

## 2022-04-03 MED ORDER — SPIRIVA RESPIMAT 2.5 MCG/ACT IN AERS: 2.0000 | INHALATION_SPRAY | Freq: Every day | RESPIRATORY_TRACT | 0 refills | Status: DC

## 2022-04-03 NOTE — Telephone Encounter (Signed)
Called and spoke to patient and advised him that Dr Erin Fulling is ok with [place] sending in one refill for his Sprivia and albuterol. But for any further refills he needs a follow up. Pt verbalized understanding but still denies making follow up at this time. Nothing further needed

## 2022-04-03 NOTE — Telephone Encounter (Signed)
Patient requesting refill of albuterol and a medication that starts with a 'R'- I believe is it spiriva respimat. Informed patient has needs a follow up for refills. States he is unable to come in due to transportation issues and living in [place]. Pt states PCP could not fill them it had to be [facility] on [address].

## 2022-04-22 NOTE — Progress Notes (Deleted)
Cardiology Office Note:    Date:  04/22/2022   ID:  Aaron Fuller, DOB 04-Jun-1956, MRN [id number]  PCP:  Loura Pardon, PA   [facility] Providers Cardiologist:  Armanda Magic, MD { Click to update primary MD,subspecialty MD or APP then REFRESH:1}    Referring MD: Loura Pardon*   Chief Complaint: ***  History of Present Illness:    Aaron Fuller is a *** 66 y.o. male with a hx of HTN, alcohol dependence, tobacco abuse, paroxysmal atrial flutter on chronic anticoagulation, chronic HFpEF, normal coronary arteries by cardiac cath 09/2019, pulmonary hypertension WHO group 3 or combined group 2 and 3, COPD, celiac artery stenosis and hepatitis C  Last cardiology clinic visit 07/06/2020 with Dr. Mayford Knife.   Admission May 2023, cardiology consult for abnormal echocardiogram.  2D echo 11/13/2021 revealed LVEF 40 to 45%, global hypokinesis of LV, severe LVH, grade 2 diastolic dysfunction, interventricular septum flattened consistent with right ventricular volume overload. Right ventricular systolic function moderately reduced, RV moderately enlarged. Mildly increased right ventricular wall thickness, moderately elevated pulmonary artery systolic pressure, RVSP 51.7 mmHg, mildly dilated LA, severely dilated RA, small pericardial effusion with no evidence of tamponade, moderate PR, severe TR, AI is mild to moderate. Was advised to follow-up as outpatient.   Today, he is here   Past Medical History:  Diagnosis Date   Alcohol dependence (HCC)    Chronic diastolic CHF (congestive heart failure) (HCC)    normal coronary arteries on cath   COPD (chronic obstructive pulmonary disease) (HCC)    Hypertension    Paroxysmal atrial flutter (HCC)    Pulmonary HTN (HCC)    secondary to COPD   Tobacco dependence     Past Surgical History:  Procedure Laterality Date   RIGHT HEART CATH N/A 11/16/2021   Procedure: RIGHT HEART CATH;  Surgeon: Lyn Records, MD;  Location: [facility] INVASIVE CV  LAB;  Service: Cardiovascular;  Laterality: N/A;   RIGHT/LEFT HEART CATH AND CORONARY ANGIOGRAPHY Bilateral 09/29/2019   Procedure: RIGHT/LEFT HEART CATH AND CORONARY ANGIOGRAPHY;  Surgeon: Lennette Bihari, MD;  Location: [facility] INVASIVE CV LAB;  Service: Cardiovascular;  Laterality: Bilateral;   ulcer surgery      Current Medications: No outpatient medications have been marked as taking for the 04/23/22 encounter (Appointment) with Lissa Hoard, Zachary George, NP.     Allergies:   Patient has no active allergies.   Social History   Socioeconomic History   Marital status: Single    Spouse name: Not on file   Number of children: Not on file   Years of education: Not on file   Highest education level: Not on file  Occupational History   Occupation: retired Investment banker, operational  Tobacco Use   Smoking status: Every Day    Packs/day: 1.00    Years: 49.00    Total pack years: 49.00    Types: Cigarettes   Smokeless tobacco: Never   Tobacco comments:    currently 0.5  Vaping Use   Vaping Use: Never used  Substance and Sexual Activity   Alcohol use: Not Currently   Drug use: Not Currently   Sexual activity: Not on file  Other Topics Concern   Not on file  Social History Narrative   Not on file   Social Determinants of Health   Financial Resource Strain: Not on file  Food Insecurity: Not on file  Transportation Needs: Not on file  Physical Activity: Not on file  Stress: Not on file  Social  Connections: Not on file     Family History: The patient's ***family history includes CAD in his father and mother; Diabetes in his mother and sister; Heart failure in his mother.  ROS:   Please see the history of present illness.    *** All other systems reviewed and are negative.  Labs/Other Studies Reviewed:    The following studies were reviewed today:  Right heart cath 11/16/21    Hemodynamic findings consistent with moderate pulmonary hypertension.   CONCLUSIONS: Moderate pulmonary hypertension,  likely WHO group 3, or combined group 2 and 3. Pulmonary vascular resistance 4.8 Woods units Mean right atrial pressure 16, PAPI 2.8 Cardiac output 5.2 L/min Cardiac index 2.74 L/min/m Mixed venous/main pulmonary artery O2 saturation 68%   RECOMMENDATIONS: Plan Per treating team.  Right atrial pressures are elevated.  Left heart hemodynamics suggest compensated heart failure at this point.    Echo 11/13/21  1. Left ventricular ejection fraction, by estimation, is 40 to 45%. The  left ventricle has mildly decreased function. The left ventricle  demonstrates global hypokinesis. There is severe concentric left  ventricular hypertrophy. Left ventricular diastolic   parameters are consistent with Grade II diastolic dysfunction  (pseudonormalization). There is the interventricular septum is flattened  in diastole ('D' shaped left ventricle), consistent with right ventricular  volume overload.   2. Right ventricular systolic function is moderately reduced. The right  ventricular size is moderately enlarged. Mildly increased right  ventricular wall thickness. There is moderately elevated pulmonary artery  systolic pressure. The estimated right  ventricular systolic pressure is 47.4 mmHg.   3. Left atrial size was mildly dilated.   4. Right atrial size was severely dilated.   5. A small pericardial effusion is present. No evidence of tamponade.   6. The mitral valve is grossly normal. Trivial mitral valve  regurgitation.   7. Tricuspid valve regurgitation is severe.   8. The aortic valve is tricuspid. Aortic valve regurgitation is mild to  moderate.   9. Pulmonic valve regurgitation is moderate.  10. Moderately dilated pulmonary artery.  11. The inferior vena cava is dilated in size with <50% respiratory  variability, suggesting right atrial pressure of 15 mmHg.   Comparison(s): Significant changes: worsening LVEF with torrential,  functional tricuspid regurgitation.    Conclusion(s)/Recommendation(s): Notable hypertrophy, atrial dilation, and  effusion: amyloid nuclear imaging may be indicated as outpatient.   Recent Labs: 11/12/2021: B Natriuretic Peptide 678.8 11/18/2021: BUN 26; Creatinine, Ser 1.43; Hemoglobin 13.2; Magnesium 1.4; Platelets 93; Potassium 3.8; Sodium 140  Recent Lipid Panel    Component Value Date/Time   CHOL 186 09/28/2019 1104   TRIG 71 09/28/2019 1104   HDL 101 09/28/2019 1104   CHOLHDL 1.8 09/28/2019 1104   VLDL 14 09/28/2019 1104   LDLCALC 71 09/28/2019 1104     Risk Assessment/Calculations:   {Does this patient have ATRIAL FIBRILLATION?:[phone]}       Physical Exam:    VS:  There were no vitals taken for this visit.    Wt Readings from Last 3 Encounters:  11/18/21 161 lb 12.8 oz (73.4 kg)  08/23/20 171 lb 4 oz (77.7 kg)  07/06/20 170 lb (77.1 kg)     GEN: *** Well nourished, well developed in no acute distress HEENT: Normal NECK: No JVD; No carotid bruits CARDIAC: ***RRR, no murmurs, rubs, gallops RESPIRATORY:  Clear to auscultation without rales, wheezing or rhonchi  ABDOMEN: Soft, non-tender, non-distended MUSCULOSKELETAL:  No edema; No deformity. *** pedal pulses, ***bilaterally SKIN: Warm  and dry NEUROLOGIC:  Alert and oriented x 3 PSYCHIATRIC:  Normal affect   EKG:  EKG is *** ordered today.  The ekg ordered today demonstrates ***  No BP recorded.  {Refresh Note OR Click here to enter BP  :1}***    Diagnoses:    No diagnosis found. Assessment and Plan:     Chronic HFpEF PAF on chronic anticoagulation: Pulmonary hypertension: Essential hypertension:  COPD Tobacco abuse  {Are you ordering a CV Procedure (e.g. stress test, cath, DCCV, TEE, etc)?   Press F2        :[id number]}   Disposition:  Medication Adjustments/Labs and Tests Ordered: Current medicines are reviewed at length with the patient today.  Concerns regarding medicines are outlined above.  No orders of the defined types  were placed in this encounter.  No orders of the defined types were placed in this encounter.   There are no Patient Instructions on file for this visit.   Signed, Levi Aland, NP  04/22/2022 5:39 AM    [facility]

## 2022-04-23 ENCOUNTER — Ambulatory Visit: Payer: Medicare Other | Admitting: Nurse Practitioner

## 2022-04-26 ENCOUNTER — Other Ambulatory Visit: Payer: Self-pay | Admitting: Pulmonary Disease

## 2022-04-26 DIAGNOSIS — J439 Emphysema, unspecified: Secondary | ICD-10-CM

## 2022-05-09 NOTE — Progress Notes (Signed)
Cardiology Office Note:    Date:  05/10/2022   ID:  Aaron Fuller, DOB 1955-12-29, MRN [id number]  PCP:  Loura Pardon, PA   [facility] Providers Cardiologist:  Armanda Magic, MD     Referring MD: Loura Pardon*   Chief Complaint: medication refills  History of Present Illness:    Aaron Fuller is a pleasant 66 y.o. male with a hx of HTN, alcohol dependence, tobacco abuse, paroxysmal atrial flutter on chronic anticoagulation, chronic HFpEF, normal coronary arteries by cardiac cath 09/2019, pulmonary hypertension WHO group 3 or combined group 2 and 3, COPD, celiac artery stenosis and hepatitis C  Last cardiology clinic visit 07/06/2020 with Dr. Mayford Knife.   Admission May 2023, cardiology consult for abnormal echocardiogram.  2D echo 11/13/2021 revealed LVEF 40 to 45%, global hypokinesis of LV, severe LVH, grade 2 diastolic dysfunction, interventricular septum flattened consistent with right ventricular volume overload. Right ventricular systolic function moderately reduced, RV moderately enlarged. Mildly increased right ventricular wall thickness, moderately elevated pulmonary artery systolic pressure, RVSP 51.7 mmHg, mildly dilated LA, severely dilated RA, small pericardial effusion with no evidence of tamponade, moderate PR, severe TR, AI is mild to moderate. Was advised to follow-up as outpatient.   Today, he is here alone for follow-up.  We have not seen him since right heart cath 11/16/2021. He is here today because his medications need refilling. Reports he has been getting along well. Continues to smoke 10 cigarettes per day. Is running out of medications and has had transportation issues. Sleeps with oxygen. Was blowing leaves yesterday and had to stop because he got short of breath.  Admits that he has not been very active recently and may be deconditioned.  He denies orthopnea, PND, edema, chest pain, palpitations, presyncope, syncope.  No bleeding concerns.  Says that he  now has stable transportation and is making certain he sees all appropriate medical providers.  Past Medical History:  Diagnosis Date   Alcohol dependence (HCC)    Chronic diastolic CHF (congestive heart failure) (HCC)    normal coronary arteries on cath   COPD (chronic obstructive pulmonary disease) (HCC)    Hypertension    Paroxysmal atrial flutter (HCC)    Pulmonary HTN (HCC)    secondary to COPD   Tobacco dependence     Past Surgical History:  Procedure Laterality Date   RIGHT HEART CATH N/A 11/16/2021   Procedure: RIGHT HEART CATH;  Surgeon: Lyn Records, MD;  Location: [facility] INVASIVE CV LAB;  Service: Cardiovascular;  Laterality: N/A;   RIGHT/LEFT HEART CATH AND CORONARY ANGIOGRAPHY Bilateral 09/29/2019   Procedure: RIGHT/LEFT HEART CATH AND CORONARY ANGIOGRAPHY;  Surgeon: Lennette Bihari, MD;  Location: [facility] INVASIVE CV LAB;  Service: Cardiovascular;  Laterality: Bilateral;   ulcer surgery      Current Medications: Current Meds  Medication Sig   albuterol (VENTOLIN HFA) 108 (90 Base) MCG/ACT inhaler Inhale 2 puffs into the lungs every 6 (six) hours as needed for wheezing or shortness of breath.   atorvastatin (LIPITOR) 20 MG tablet Take 1 tablet (20 mg total) by mouth daily at 6 PM.   Cyanocobalamin (B-12) 5000 MCG SUBL Take by mouth once a week.   diphenhydrAMINE (BENADRYL) 25 mg capsule Take by mouth as needed for allergies.   folic acid (FOLVITE) 1 MG tablet Take 1 tablet (1 mg total) by mouth daily.   furosemide (LASIX) 20 MG tablet Take 1 tablet (20 mg total) by mouth 2 (two) times daily.  metoprolol succinate (TOPROL-XL) 25 MG 24 hr tablet Take 1 tablet (25 mg total) by mouth daily.   Multiple Vitamin (MULTIVITAMIN WITH MINERALS) TABS tablet Take 1 tablet by mouth daily.   nicotine (NICODERM CQ - DOSED IN MG/24 HOURS) 14 mg/24hr patch Place 1 patch (14 mg total) onto the skin daily.   OXYGEN Inhale 4 L into the lungs at bedtime.   thiamine 100 MG tablet Take 1 tablet (100  mg total) by mouth daily.   Tiotropium Bromide Monohydrate (SPIRIVA RESPIMAT) 2.5 MCG/ACT AERS Inhale 2 puffs into the lungs daily.   umeclidinium-vilanterol (ANORO ELLIPTA) 62.5-25 MCG/ACT AEPB Inhale 1 puff into the lungs daily.   [DISCONTINUED] apixaban (ELIQUIS) 5 MG TABS tablet Take 1 tablet (5 mg total) by mouth 2 (two) times daily.     Allergies:   Patient has no active allergies.   Social History   Socioeconomic History   Marital status: Single    Spouse name: Not on file   Number of children: Not on file   Years of education: Not on file   Highest education level: Not on file  Occupational History   Occupation: retired Investment banker, operational  Tobacco Use   Smoking status: Every Day    Packs/day: 1.00    Years: 49.00    Total pack years: 49.00    Types: Cigarettes   Smokeless tobacco: Never   Tobacco comments:    currently 0.5  Vaping Use   Vaping Use: Never used  Substance and Sexual Activity   Alcohol use: Not Currently   Drug use: Not Currently   Sexual activity: Not on file  Other Topics Concern   Not on file  Social History Narrative   Not on file   Social Determinants of Health   Financial Resource Strain: Not on file  Food Insecurity: Not on file  Transportation Needs: Not on file  Physical Activity: Not on file  Stress: Not on file  Social Connections: Not on file     Family History: The patient's family history includes CAD in his father and mother; Diabetes in his mother and sister; Heart failure in his mother.  ROS:   Please see the history of present illness.    + chronic DOE All other systems reviewed and are negative.  Labs/Other Studies Reviewed:    The following studies were reviewed today:  Right heart cath 11/16/21    Hemodynamic findings consistent with moderate pulmonary hypertension.   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 Pulmonary vascular resistance 4.8 Woods units Mean right atrial  pressure 16, PAPI 2.8 Cardiac output 5.2 L/min Cardiac index 2.74 L/min/m Mixed venous/main pulmonary artery O2 saturation 68%   RECOMMENDATIONS: Plan Per treating team.  Right atrial pressures are elevated.  Left heart hemodynamics suggest compensated heart failure at this point.    Echo 11/13/21  1. Left ventricular ejection fraction, by estimation, is 40 to 45%. The  left ventricle has mildly decreased function. The left ventricle  demonstrates global hypokinesis. There is severe concentric left  ventricular hypertrophy. Left ventricular diastolic   parameters are consistent with Grade II diastolic dysfunction  (pseudonormalization). There is the interventricular septum is flattened  in diastole ('D' shaped left ventricle), consistent with right ventricular  volume overload.   2. Right ventricular systolic function is moderately reduced. The right  ventricular size is moderately enlarged. Mildly increased right  ventricular wall thickness. There is moderately elevated pulmonary artery  systolic pressure. The estimated  right  ventricular systolic pressure is 51.7 mmHg.   3. Left atrial size was mildly dilated.   4. Right atrial size was severely dilated.   5. A small pericardial effusion is present. No evidence of tamponade.   6. The mitral valve is grossly normal. Trivial mitral valve  regurgitation.   7. Tricuspid valve regurgitation is severe.   8. The aortic valve is tricuspid. Aortic valve regurgitation is mild to  moderate.   9. Pulmonic valve regurgitation is moderate.  10. Moderately dilated pulmonary artery.  11. The inferior vena cava is dilated in size with <50% respiratory  variability, suggesting right atrial pressure of 15 mmHg.   Comparison(s): Significant changes: worsening LVEF with torrential,  functional tricuspid regurgitation.   Conclusion(s)/Recommendation(s): Notable hypertrophy, atrial dilation, and  effusion: amyloid nuclear imaging may be indicated  as outpatient.   Recent Labs: 11/12/2021: B Natriuretic Peptide 678.8 11/18/2021: BUN 26; Creatinine, Ser 1.43; Hemoglobin 13.2; Magnesium 1.4; Platelets 93; Potassium 3.8; Sodium 140  Recent Lipid Panel    Component Value Date/Time   CHOL 186 09/28/2019 1104   TRIG 71 09/28/2019 1104   HDL 101 09/28/2019 1104   CHOLHDL 1.8 09/28/2019 1104   VLDL 14 09/28/2019 1104   LDLCALC 71 09/28/2019 1104     Risk Assessment/Calculations:    CHA2DS2-VASc Score = 3   This indicates a 3.2% annual risk of stroke. The patient's score is based upon: CHF History: 1 HTN History: 1 Diabetes History: 0 Stroke History: 0 Vascular Disease History: 0 Age Score: 1 Gender Score: 0    Physical Exam:    VS:  BP 122/70   Pulse 90   Ht 5' 8.5" (1.74 m)   Wt 169 lb (76.7 kg)   SpO2 95%   BMI 25.32 kg/m     Wt Readings from Last 3 Encounters:  05/10/22 169 lb (76.7 kg)  11/18/21 161 lb 12.8 oz (73.4 kg)  08/23/20 171 lb 4 oz (77.7 kg)     GEN:  Well nourished, well developed in no acute distress HEENT: Normal NECK: No JVD; No carotid bruits CARDIAC: RRR, no murmurs, rubs, gallops RESPIRATORY:  Clear to auscultation without rales, wheezing or rhonchi  ABDOMEN: Soft, non-tender, non-distended MUSCULOSKELETAL:  No edema; No deformity. 2+ pedal pulses, equal bilaterally SKIN: Warm and dry NEUROLOGIC:  Alert and oriented x 3 PSYCHIATRIC:  Normal affect   EKG:  EKG is not ordered today.    Diagnoses:    1. Paroxysmal atrial flutter (HCC)   2. Pulmonary hypertension, unspecified (HCC)   3. Chronic combined systolic and diastolic CHF, NYHA class 2 (HCC)   4. Tobacco dependence   5. Chronic anticoagulation   6. Essential hypertension    Assessment and Plan:     Chronic combined CHF: LVEF 40 to 45%, G2 DD, moderately reduced RV function, moderately enlarged RV on echo 11/13/21.  He reports chronic dyspnea that he feels is stable.  No weight gain, edema, orthopnea, or PND.  Appears  euvolemic on exam.  We discussed adding potential goal directed medical therapy, however he would like to monitor symptoms once he gets all medications refilled.  Advised him to let us know if symptoms worsen prior to next appointment.   PAF on chronic anticoagulation: Appears to be in sinus rhythm on exam. Reports he has not missed any doses of Eliquis. Denies palpitations, tachycardia. No bleeding concerns. Continue Eliquis, Toprol.  Pulmonary hypertension: Moderate pulmonary hypertension likely WHO group 3 or combined group 2 and 3  on right heart cath 10/2021. This is his first return visit following cath. Reports occasional shortness of breath with activities that he feels is stable.[place] Ouch he is not very active and will work on increasing activity. No edema, orthopnea, PND. Encouraged him to follow-up with pulmonary. States he is planning to see pulmonology soon.   Essential hypertension: BP is well-controlled.   Tobacco abuse: Continues to smoke 10 cigarettes/day.  Complete cessation advised.     Disposition: 7 months with Dr. Mayford Knife  Medication Adjustments/Labs and Tests Ordered: Current medicines are reviewed at length with the patient today.  Concerns regarding medicines are outlined above.  No orders of the defined types were placed in this encounter.  Meds ordered this encounter  Medications   apixaban (ELIQUIS) 5 MG TABS tablet    Sig: Take 1 tablet (5 mg total) by mouth 2 (two) times daily.    Dispense:  60 tablet    Refill:  11    Patient Instructions  Medication Instructions:   Your physician recommends that you continue on your current medications as directed. Please refer to the Current Medication list given to you today.   *If you need a refill on your cardiac medications before your next appointment, please call your pharmacy*   Lab Work:  None ordered.  If you have labs (blood work) drawn today and your tests are completely normal, you will receive your  results only by: MyChart Message (if you have MyChart) OR A paper copy in the mail If you have any lab test that is abnormal or we need to change your treatment, we will call you to review the results.   Testing/Procedures:  None ordered.   Follow-Up: At [facility], you and your health needs are our priority.  As part of our continuing mission to provide you with exceptional heart care, we have created designated Provider Care Teams.  These Care Teams include your primary Cardiologist (physician) and Advanced Practice Providers (APPs -  Physician Assistants and Nurse Practitioners) who all work together to provide you with the care you need, when you need it.  We recommend signing up for the patient portal called "MyChart".  Sign up information is provided on this After Visit Summary.  MyChart is used to connect with patients for Virtual Visits (Telemedicine).  Patients are able to view lab/test results, encounter notes, upcoming appointments, etc.  Non-urgent messages can be sent to your provider as well.   To learn more about what you can do with MyChart, go to [email or web address].    Your next appointment:   7 month(s)  The format for your next appointment:   In Person  Provider:   Armanda Magic, MD      Important Information About Sugar         Signed, Levi Aland, NP  05/10/2022 1:43 PM    [facility]

## 2022-05-10 ENCOUNTER — Ambulatory Visit: Payer: Medicare Other | Attending: Nurse Practitioner | Admitting: Nurse Practitioner

## 2022-05-10 ENCOUNTER — Encounter: Payer: Self-pay | Admitting: Nurse Practitioner

## 2022-05-10 VITALS — BP 122/70 | HR 90 | Ht 68.5 in | Wt 169.0 lb

## 2022-05-10 DIAGNOSIS — I10 Essential (primary) hypertension: Secondary | ICD-10-CM

## 2022-05-10 DIAGNOSIS — F17200 Nicotine dependence, unspecified, uncomplicated: Secondary | ICD-10-CM | POA: Diagnosis not present

## 2022-05-10 DIAGNOSIS — Z7901 Long term (current) use of anticoagulants: Secondary | ICD-10-CM

## 2022-05-10 DIAGNOSIS — I4892 Unspecified atrial flutter: Secondary | ICD-10-CM | POA: Diagnosis not present

## 2022-05-10 DIAGNOSIS — I5042 Chronic combined systolic (congestive) and diastolic (congestive) heart failure: Secondary | ICD-10-CM | POA: Diagnosis not present

## 2022-05-10 DIAGNOSIS — I272 Pulmonary hypertension, unspecified: Secondary | ICD-10-CM | POA: Diagnosis not present

## 2022-05-10 MED ORDER — APIXABAN 5 MG PO TABS: 5.0000 mg | ORAL_TABLET | Freq: Two times a day (BID) | ORAL | 11 refills | Status: AC

## 2022-05-10 NOTE — Patient Instructions (Signed)
Medication Instructions:   Your physician recommends that you continue on your current medications as directed. Please refer to the Current Medication list given to you today.   *If you need a refill on your cardiac medications before your next appointment, please call your pharmacy*   Lab Work:  None ordered.  If you have labs (blood work) drawn today and your tests are completely normal, you will receive your results only by: MyChart Message (if you have MyChart) OR A paper copy in the mail If you have any lab test that is abnormal or we need to change your treatment, we will call you to review the results.   Testing/Procedures:  None ordered.   Follow-Up: At [facility], you and your health needs are our priority.  As part of our continuing mission to provide you with exceptional heart care, we have created designated Provider Care Teams.  These Care Teams include your primary Cardiologist (physician) and Advanced Practice Providers (APPs -  Physician Assistants and Nurse Practitioners) who all work together to provide you with the care you need, when you need it.  We recommend signing up for the patient portal called "MyChart".  Sign up information is provided on this After Visit Summary.  MyChart is used to connect with patients for Virtual Visits (Telemedicine).  Patients are able to view lab/test results, encounter notes, upcoming appointments, etc.  Non-urgent messages can be sent to your provider as well.   To learn more about what you can do with MyChart, go to [email or web address].    Your next appointment:   7 month(s)  The format for your next appointment:   In Person  Provider:   Armanda Magic, MD      Important Information About Sugar

## 2022-08-30 ENCOUNTER — Encounter: Payer: Self-pay | Admitting: Cardiology

## 2022-08-30 NOTE — Telephone Encounter (Signed)
Error

## 2022-09-02 ENCOUNTER — Other Ambulatory Visit: Payer: Self-pay | Admitting: Pulmonary Disease

## 2022-09-02 DIAGNOSIS — J439 Emphysema, unspecified: Secondary | ICD-10-CM

## 2022-09-07 ENCOUNTER — Other Ambulatory Visit: Payer: Self-pay | Admitting: Pulmonary Disease

## 2022-09-07 DIAGNOSIS — J439 Emphysema, unspecified: Secondary | ICD-10-CM

## 2022-11-28 ENCOUNTER — Ambulatory Visit: Payer: 59 | Admitting: Cardiology

## 2023-02-10 ENCOUNTER — Ambulatory Visit: Payer: 59 | Admitting: Cardiology

## 2023-04-15 IMAGING — US US RENAL
1 series · 14 of 25 positions shown · non-contrast
Comparison: October 01, 2019.

CLINICAL DATA: Acute kidney injury.

EXAM:
RENAL / URINARY TRACT ULTRASOUND COMPLETE

[Series 1: us renal · 14 of 35 slices shown]
[im 1/35]
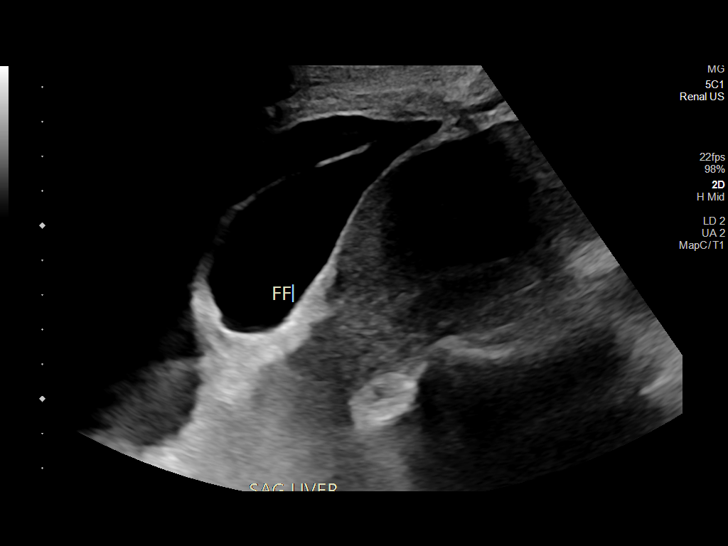
[im 3/35]
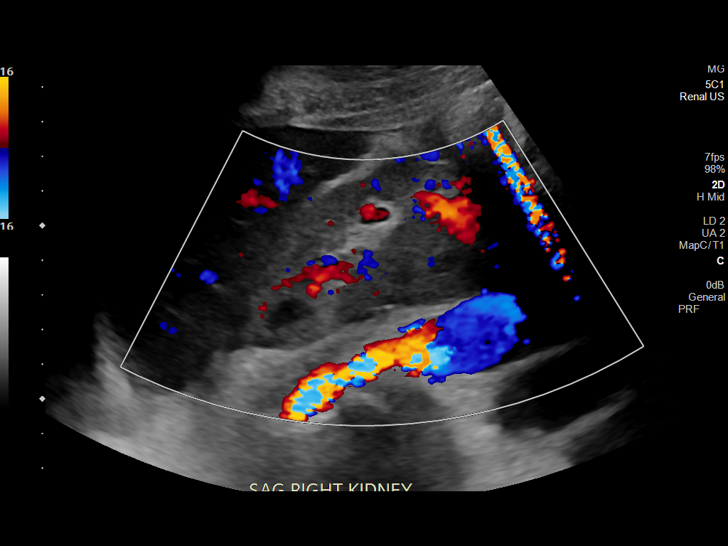
[im 6/35]
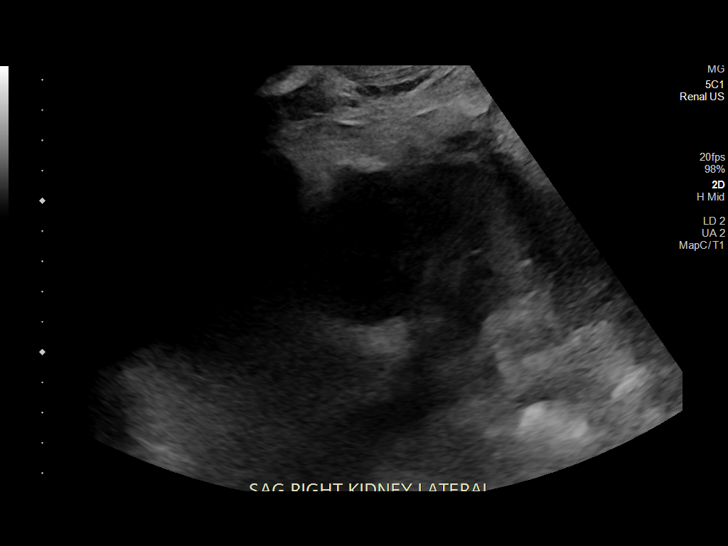
[im 9/35]
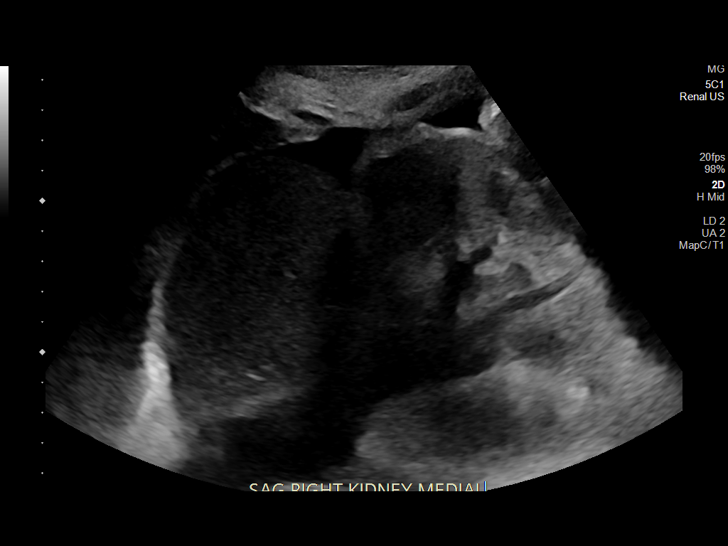
[im 12/35]
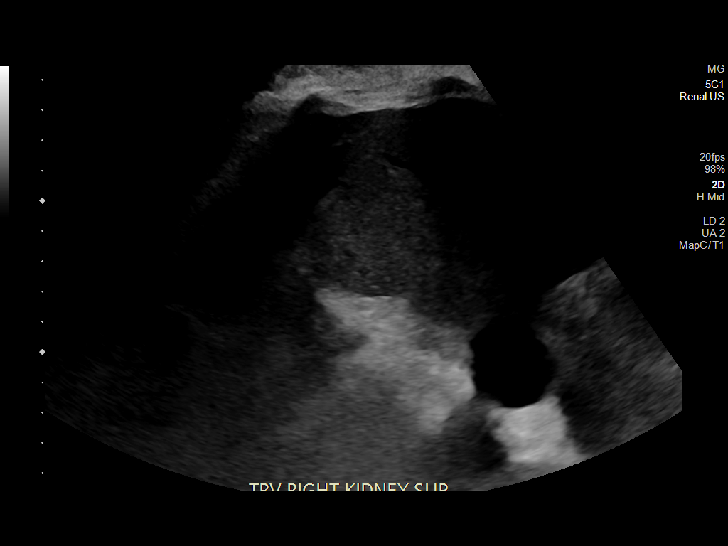
[im 13/35]
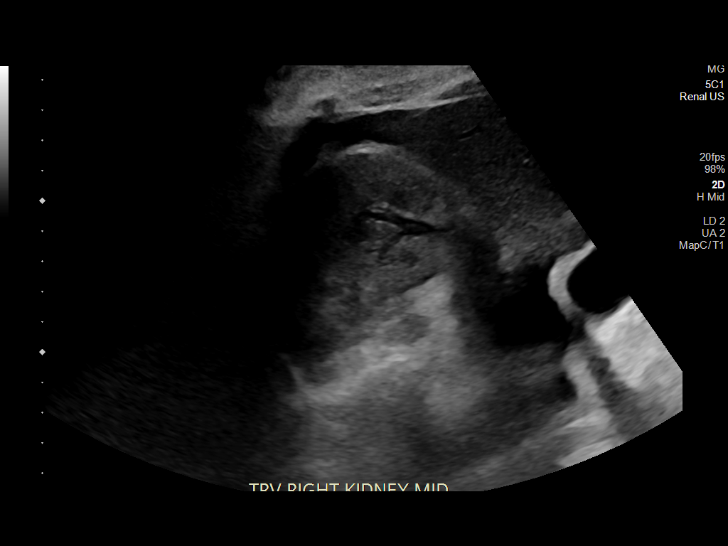
[im 16/35]
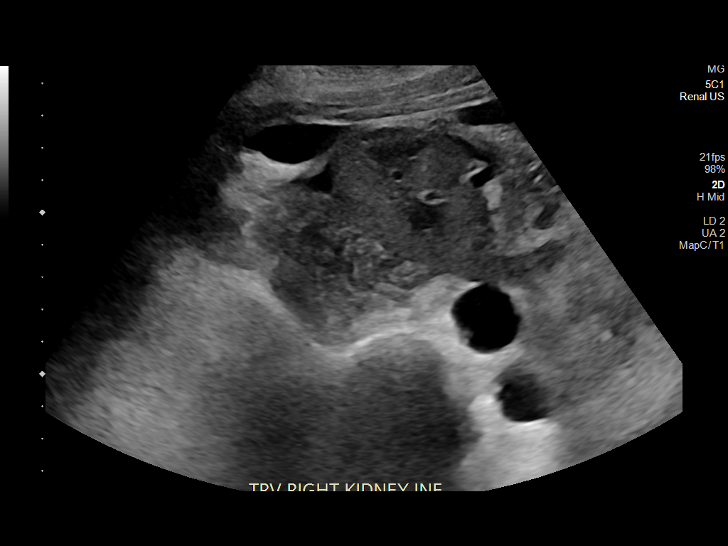
[im 19/35]
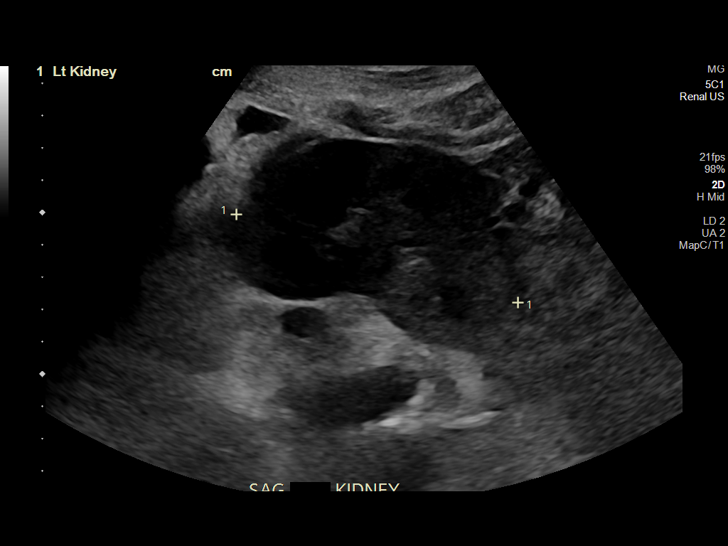
[im 22/35]
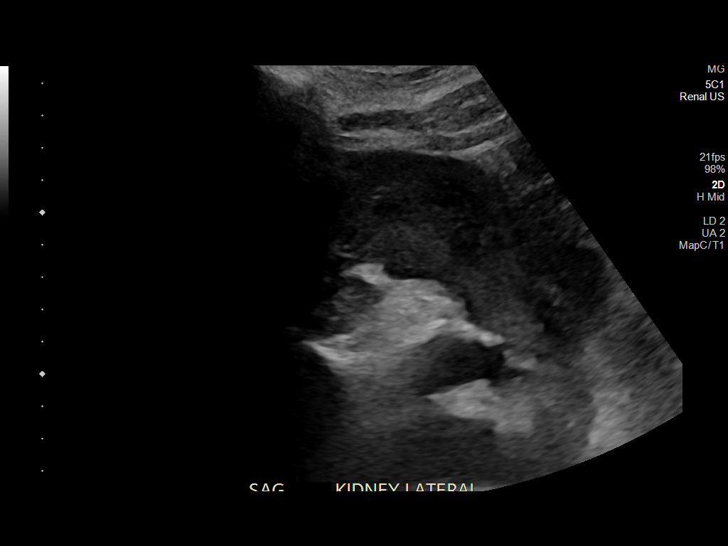
[im 23/35]
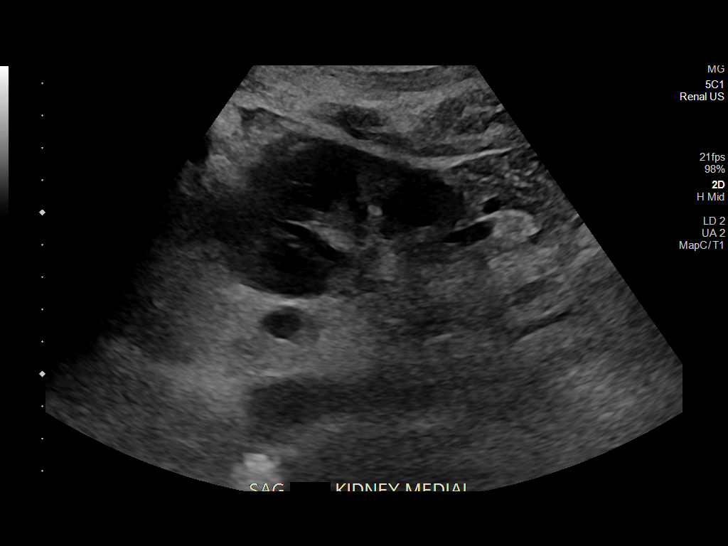
[im 26/35]
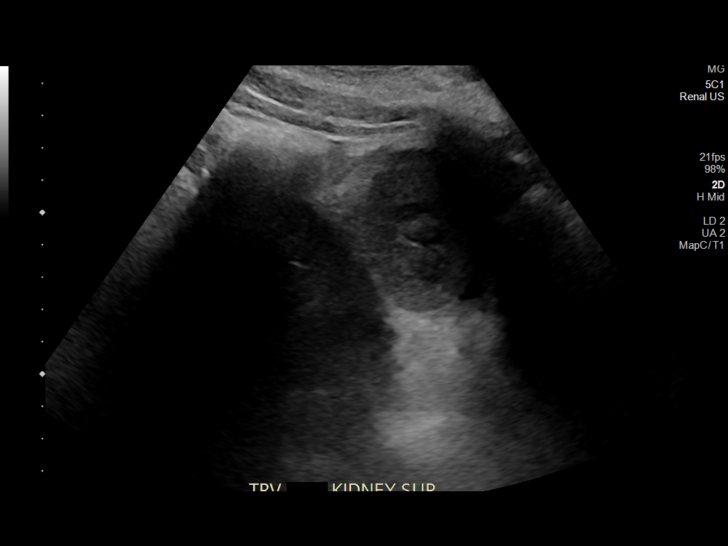
[im 29/35]
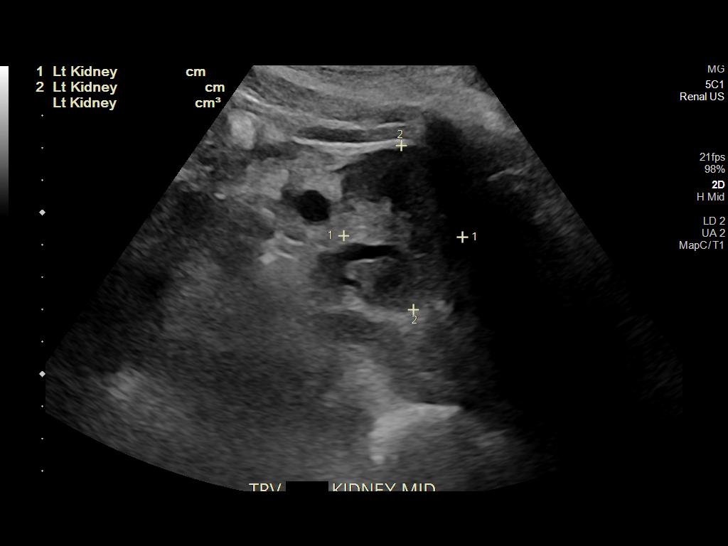
[im 32/35]
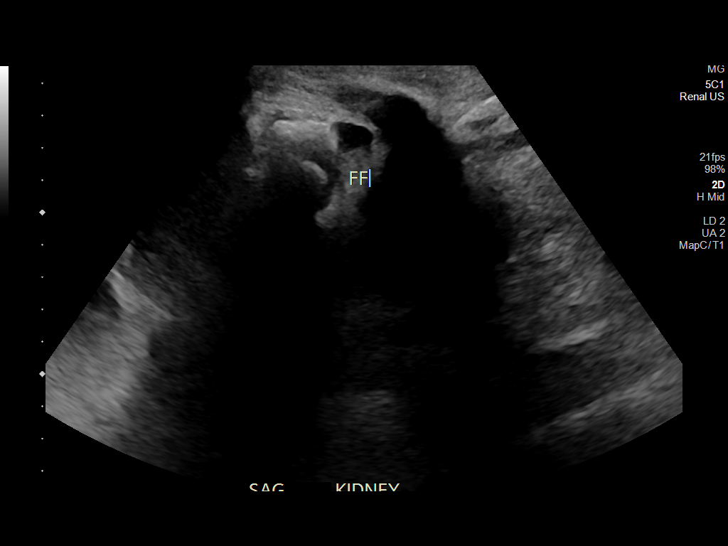
[im 35/35]
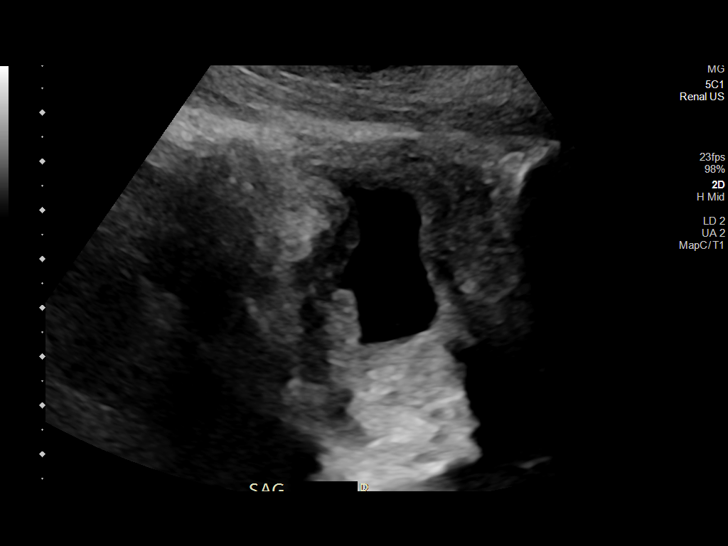

[14 of 25 positions shown; findings below may reference images not displayed]

FINDINGS: Right Kidney:

Renal measurements: 12.0 x 4.9 x 4.6 cm = volume: 140 mL. Increased
echogenicity of renal parenchyma is noted. No mass or hydronephrosis
visualized.

Left Kidney:

Renal measurements: 9.1 x 5.1 x 3.7 cm = volume: 90 mL. Increased
echogenicity of renal parenchyma is noted. No mass or hydronephrosis
visualized.

Bladder:

Appears normal for degree of bladder distention.

Other:

Mild ascites is noted in the abdomen.
IMPRESSION: Increased echogenicity of renal parenchyma is noted bilaterally
suggesting medical renal disease. Mild left renal atrophy is noted.
No hydronephrosis or renal obstruction is noted. Mild ascites.

## 2023-04-28 NOTE — Progress Notes (Unsigned)
Cardiology Office Note:  .   Date:  04/29/2023  ID:  Aaron Fuller, DOB July 20, 1955, MRN [id number] PCP: Loura Pardon, PA  [facility] Providers Cardiologist:  Armanda Magic, MD {  History of Present Illness: [name] Kitchen   Aaron Fuller is a 67 y.o. male with a past medical history of HTN, alcohol dependence, tobacco abuse, PAF on chronic anticoagulation, chronic HFpEF, normal coronary arteries by cardiac cath 09/2019, pulmonary hypertension, COPD, celiac artery stenosis and hepatitis C here for follow-up appointment.  Seen by Dr. Mayford Knife 07/06/2020.  Admission May 2023, cardiology consult for abnormal echocardiogram.  2D echo 11/13/2021 reviewed LVEF 44 5%, global hypokinesis of LV, severe LVH, grade 2 DD, interventricular septum flattening consistent with right ventricular volume overload.  Right ventricle systolic function moderately reduced, RV moderately enlarged, mild increased right ventricular wall thickness, moderately elevated pulmonary artery systolic pressure, RVSP 51.7 mmHg, mildly dilated LA, severely dilated RA, small pericardial effusion with no evidence of Tebben on, moderate PR, severe TR, AI mild to moderate.  Was advised to follow-up.  Was seen for follow-up November 2023 by Michelle's 1 year, NP.  Reported he had been doing okay.  Continues smoke 10 cigarettes a day.  Running out of medicines and has transportation issues.  Sleeps with oxygen.  Unfortunately was blowing leaves a day prior and had to stop because of shortness of breath.  Admits he has not been very active recently and may be deconditioned.  He did deny orthopnea, PND, edema, chest pain, palpitations, syncope/presyncope.  No bleeding concerns.  Today, he has a history of lung disease and hypertension, presents with intermittent chest tightness. The discomfort is sometimes associated with activity, but primarily occurs during sleep or upon waking. He also reports shortness of breath. The patient continues to smoke and  is currently on inhalers for lung disease management. The patient's blood pressure and heart rate are consistently high, despite being on metoprolol. He also reports a need for podiatry care due to difficulty managing toenails. He is on 5L [place] today during our encounter and this is his home dose but he does not have his own oxygen today. HR is also elevated but just took his 100mg  of metoprolol right before he came to his appointment.   No edema, orthopnea, PND. Reports no palpitations.   Discussed the use of AI scribe software for clinical note transcription with the patient, who gave verbal consent to proceed.   ROS: Pertinent ROS in HPI  Studies Reviewed: [name] Kitchen   EKG Interpretation Date/Time:  Tuesday April 29 2023 08:29:00 EST Ventricular Rate:  92 PR Interval:  166 QRS Duration:  86 QT Interval:  360 QTC Calculation: 445 R Axis:   100  Text Interpretation: Normal sinus rhythm Rightward axis Minimal voltage criteria for LVH, may be normal variant ( Cornell product ) When compared with ECG of 12-Nov-2021 18:37, PREVIOUS ECG IS PRESENT Confirmed by Jari Favre [phone]) on 04/29/2023 8:45:34 AM    Echo 11/13/21   1. Left ventricular ejection fraction, by estimation, is 40 to 45%. The  left ventricle has mildly decreased function. The left ventricle  demonstrates global hypokinesis. There is severe concentric left  ventricular hypertrophy. Left ventricular diastolic   parameters are consistent with Grade II diastolic dysfunction  (pseudonormalization). There is the interventricular septum is flattened  in diastole ('D' shaped left ventricle), consistent with right ventricular  volume overload.   2. Right ventricular systolic function is moderately reduced. The right  ventricular size is moderately enlarged.  Mildly increased right  ventricular wall thickness. There is moderately elevated pulmonary artery  systolic pressure. The estimated right  ventricular systolic pressure is 51.7  mmHg.   3. Left atrial size was mildly dilated.   4. Right atrial size was severely dilated.   5. A small pericardial effusion is present. No evidence of tamponade.   6. The mitral valve is grossly normal. Trivial mitral valve  regurgitation.   7. Tricuspid valve regurgitation is severe.   8. The aortic valve is tricuspid. Aortic valve regurgitation is mild to  moderate.   9. Pulmonic valve regurgitation is moderate.  10. Moderately dilated pulmonary artery.  11. The inferior vena cava is dilated in size with <50% respiratory  variability, suggesting right atrial pressure of 15 mmHg.   Comparison(s): Significant changes: worsening LVEF with torrential,  functional tricuspid regurgitation.   Conclusion(s)/Recommendation(s): Notable hypertrophy, atrial dilation, and  effusion: amyloid nuclear imaging may be indicated as outpatient.        Physical Exam:   VS:  BP (!) 172/88   Pulse (!) 50   Ht 5\' 8"  (1.727 m)   Wt 172 lb 12.8 oz (78.4 kg)   SpO2 93%   BMI 26.27 kg/m    Wt Readings from Last 3 Encounters:  04/29/23 172 lb 12.8 oz (78.4 kg)  05/10/22 169 lb (76.7 kg)  11/18/21 161 lb 12.8 oz (73.4 kg)    GEN: Well nourished, well developed in no acute distress NECK: No JVD; No carotid bruits CARDIAC: RRR, no murmurs, rubs, gallops RESPIRATORY:  Clear to auscultation without rales, wheezing or rhonchi  ABDOMEN: Soft, non-tender, non-distended EXTREMITIES:  No edema; No deformity   ASSESSMENT AND PLAN: .   PAF -no further issues -he is on metoprolol BID  Chronic combined CHF -No swelling in his legs -continue current medications, no need for additional diuretics at this moment  Chest Pain Intermittent chest tightness, worse at night and sometimes with activity. No associated dyspnea. EKG shows tachycardia. -Order chemical stress test to evaluate for ischemic heart disease. -Prescribe nitroglycerin sublingual tablets for use as needed for chest  pain.  Hypertension Blood pressure elevated at 172/88 despite current treatment with metoprolol 100mg  in the morning and 25mg  in the evening. -Add amlodipine 5mg  daily. -Advise patient's wife to monitor blood pressure at home for the next two weeks, an hour after morning and evening medications, and report readings.  COPD/tobacco dependence Patient reports running out of inhaler medication. -Refill inhaler prescriptions for one month until follow-up with primary care provider. -Refill nicotine patches 14mg  for smoking cessation.       Dispo: Please return in 3 months for follow-up with me.  Signed, Sharlene Dory, PA-C

## 2023-04-29 ENCOUNTER — Encounter: Payer: Self-pay | Admitting: Physician Assistant

## 2023-04-29 ENCOUNTER — Ambulatory Visit: Payer: 59 | Attending: Cardiology | Admitting: Physician Assistant

## 2023-04-29 VITALS — BP 172/88 | HR 50 | Ht 68.0 in | Wt 172.8 lb

## 2023-04-29 DIAGNOSIS — I10 Essential (primary) hypertension: Secondary | ICD-10-CM

## 2023-04-29 DIAGNOSIS — R079 Chest pain, unspecified: Secondary | ICD-10-CM

## 2023-04-29 DIAGNOSIS — I5042 Chronic combined systolic (congestive) and diastolic (congestive) heart failure: Secondary | ICD-10-CM | POA: Diagnosis not present

## 2023-04-29 DIAGNOSIS — I4892 Unspecified atrial flutter: Secondary | ICD-10-CM | POA: Diagnosis not present

## 2023-04-29 DIAGNOSIS — Z7901 Long term (current) use of anticoagulants: Secondary | ICD-10-CM | POA: Diagnosis not present

## 2023-04-29 DIAGNOSIS — F17200 Nicotine dependence, unspecified, uncomplicated: Secondary | ICD-10-CM | POA: Diagnosis not present

## 2023-04-29 DIAGNOSIS — J439 Emphysema, unspecified: Secondary | ICD-10-CM

## 2023-04-29 MED ORDER — NICOTINE 14 MG/24HR TD PT24: 14.0000 mg | MEDICATED_PATCH | Freq: Every day | TRANSDERMAL | 1 refills | Status: AC

## 2023-04-29 MED ORDER — UMECLIDINIUM-VILANTEROL 62.5-25 MCG/ACT IN AEPB: 1.0000 | INHALATION_SPRAY | Freq: Every day | RESPIRATORY_TRACT | 0 refills | Status: AC

## 2023-04-29 MED ORDER — NITROGLYCERIN 0.4 MG SL SUBL: 0.4000 mg | SUBLINGUAL_TABLET | SUBLINGUAL | 3 refills | Status: AC | PRN

## 2023-04-29 MED ORDER — AMLODIPINE BESYLATE 5 MG PO TABS: 5.0000 mg | ORAL_TABLET | Freq: Every day | ORAL | 3 refills | Status: DC

## 2023-04-29 MED ORDER — SPIRIVA RESPIMAT 2.5 MCG/ACT IN AERS: 2.0000 | INHALATION_SPRAY | Freq: Every day | RESPIRATORY_TRACT | 0 refills | Status: DC

## 2023-04-29 NOTE — Patient Instructions (Signed)
Medication Instructions:   Refilled your Nicotine patch and two inhalers.  Your next inhaler prescription will have to come from your PCP.   START nitroglycerin   If a single episode of chest pain is not relieved by one tablet, the patient will try another within 5 minutes; and if this doesn't relieve the pain, the patient will try another within 5 minutes and if this doesn't relieve the pain the patient is instructed to call 911 for transportation to an emergency department.  START  Amlodipine one (1) tablet by mouth ( 5 mg) daily.   *If you need a refill on your cardiac medications before your next appointment, please call your pharmacy*   Lab Work:  Please get your labs drawn by your PCP.   If you have labs (blood work) drawn today and your tests are completely normal, you will receive your results only by: MyChart Message (if you have MyChart) OR A paper copy in the mail If you have any lab test that is abnormal or we need to change your treatment, we will call you to review the results.   Testing/Procedures:  You are scheduled for a Myocardial Perfusion Imaging Study on Thursday, November 14 at 8:00 am.   Please arrive 15 minutes prior to your appointment time for registration and insurance purposes.   The test will take approximately 3 to 4 hours to complete; you may bring reading material. If someone comes with you to your appointment, they will need to remain in the main lobby due to limited space in the testing area.   How to prepare for your Myocardial Perfusion test:   Do not eat or drink 3 hours prior to your test, except you may have water.    Do not consume products containing caffeine (regular or decaffeinated) 12 hours prior to your test (ex: coffee, chocolate, soda, tea)   Do bring a list of your current medications with you. If not listed below, you may take your medications as normal.    Bring any held medication to your appointment, as you may be required to  take it once the test is complete.   Do wear comfortable clothes (no dresses or overalls) and walking shoes. Tennis shoes are preferred. No heels or open toed shoes.  Do not wear cologne, aftershave or lotions (deodorant is allowed).   If these instructions are not followed, you test will have to be rescheduled.   Please report to [address] for your test. If you have questions or concerns about your appointment, please call the Nuclear Lab at #[phone].  If you cannot keep your appointment, please provide 24 hour notification to the Nuclear lab to avoid a possible $50 charge to your account.       Follow-Up: At [facility], you and your health needs are our priority.  As part of our continuing mission to provide you with exceptional heart care, we have created designated Provider Care Teams.  These Care Teams include your primary Cardiologist (physician) and Advanced Practice Providers (APPs -  Physician Assistants and Nurse Practitioners) who all work together to provide you with the care you need, when you need it.  We recommend signing up for the patient portal called "MyChart".  Sign up information is provided on this After Visit Summary.  MyChart is used to connect with patients for Virtual Visits (Telemedicine).  Patients are able to view lab/test results, encounter notes, upcoming appointments, etc.  Non-urgent messages can be  sent to your provider as well.   To learn more about what you can do with MyChart, go to [email or web address].    Your next appointment:   3 month(s)  Provider:   Armanda Magic, MD     Other Instructions  HOW TO TAKE YOUR BLOOD PRESSURE  Rest 5 minutes before taking your blood pressure. Don't  smoke or drink caffeinated beverages for at least 30 minutes before. Take your blood pressure before (not after) you eat. Sit comfortably with your back supported and both feet on the floor ( don't cross your  legs). Elevate your arm to heart level on a table or a desk. Use the proper sized cuff.  It should fit smoothly and snugly around your bare upper arm.  There should be  Enough room to slip a fingertip under the cuff.  The bottom edge of the cuff should be 1 inch above the crease Of the elbow. Please monitor your blood pressure once daily 2 hours after your am medication. If you blood pressure Consistently remains above 130 (systolic) top number or over 80 ( diastolic) bottom number X 3 days  Consecutively.  Please call our office at [phone] or send Mychart message.     ----Avoid cold medicines with D or DM at the end of them----    Blood Pressure Record Sheet To take your blood pressure, you will need a blood pressure machine. You may be prescribed one, or you can buy a blood pressure machine (blood pressure monitor) at your clinic, drug store, or online. When choosing one, look for these features: An automatic monitor that has an arm cuff. A cuff that wraps snugly, but not too tightly, around your upper arm. You should be able to fit only one finger between your arm and the cuff. A device that stores blood pressure reading results. Do not choose a monitor that measures your blood pressure from your wrist or finger. Follow your health care provider's instructions for how to take your blood pressure. To use this form: Get one reading in the morning (a.m.) before you take any medicines. Get one reading in the evening (p.m.) before supper. Take at least two readings with each blood pressure check. This makes sure the results are correct. Wait 1-2 minutes between measurements. Write down the results in the spaces on this form. Repeat this once a week, or as told by your health care provider. Make a follow-up appointment with your health care provider to discuss the results. Blood pressure log Date: _______________________ a.m. _____________________(1st reading) _____________________(2nd  reading) p.m. _____________________(1st reading) _____________________(2nd reading) Date: _______________________ a.m. _____________________(1st reading) _____________________(2nd reading) p.m. _____________________(1st reading) _____________________(2nd reading) Date: _______________________ a.m. _____________________(1st reading) _____________________(2nd reading) p.m. _____________________(1st reading) _____________________(2nd reading) Date: _______________________ a.m. _____________________(1st reading) _____________________(2nd reading) p.m. _____________________(1st reading) _____________________(2nd reading) Date: _______________________ a.m. _____________________(1st reading) _____________________(2nd reading) p.m. _____________________(1st reading) _____________________(2nd reading) This information is not intended to replace advice given to you by your health care provider. Make sure you discuss any questions you have with your health care provider. Document Revised: 02/22/2021 Document Reviewed: 02/22/2021 Elsevier Patient Education  2024 Elsevier Inc.  DASH Eating Plan DASH stands for Dietary Approaches to Stop Hypertension. The DASH eating plan is a healthy eating plan that has been shown to: Lower high blood pressure (hypertension). Reduce your risk for type 2 diabetes, heart disease, and stroke. Help with weight loss. What are tips for following this plan? Reading food labels Check food labels for the amount of salt (sodium) per serving.  Choose foods with less than 5 percent of the Daily Value (DV) of sodium. In general, foods with less than 300 milligrams (mg) of sodium per serving fit into this eating plan. To find whole grains, look for the word "whole" as the first word in the ingredient list. Shopping Buy products labeled as "low-sodium" or "no salt added." Buy fresh foods. Avoid canned foods and pre-made or frozen meals. Cooking Try not to add salt when you cook.  Use salt-free seasonings or herbs instead of table salt or sea salt. Check with your health care provider or pharmacist before using salt substitutes. Do not fry foods. Cook foods in healthy ways, such as baking, boiling, grilling, roasting, or broiling. Cook using oils that are good for your heart. These include olive, canola, avocado, soybean, and sunflower oil. Meal planning  Eat a balanced diet. This should include: 4 or more servings of fruits and 4 or more servings of vegetables each day. Try to fill half of your plate with fruits and vegetables. 6-8 servings of whole grains each day. 6 or less servings of lean meat, poultry, or fish each day. 1 oz is 1 serving. A 3 oz (85 g) serving of meat is about the same size as the palm of your hand. One egg is 1 oz (28 g). 2-3 servings of low-fat dairy each day. One serving is 1 cup (237 mL). 1 serving of nuts, seeds, or beans 5 times each week. 2-3 servings of heart-healthy fats. Healthy fats called omega-3 fatty acids are found in foods such as walnuts, flaxseeds, fortified milks, and eggs. These fats are also found in cold-water fish, such as sardines, salmon, and mackerel. Limit how much you eat of: Canned or prepackaged foods. Food that is high in trans fat, such as fried foods. Food that is high in saturated fat, such as fatty meat. Desserts and other sweets, sugary drinks, and other foods with added sugar. Full-fat dairy products. Do not salt foods before eating. Do not eat more than 4 egg yolks a week. Try to eat at least 2 vegetarian meals a week. Eat more home-cooked food and less restaurant, buffet, and fast food. Lifestyle When eating at a restaurant, ask if your food can be made with less salt or no salt. If you drink alcohol: Limit how much you have to: 0-1 drink a day if you are male. 0-2 drinks a day if you are male. Know how much alcohol is in your drink. In the [place] one drink is one 12 oz bottle of beer (355 mL), one 5 oz  glass of wine (148 mL), or one 1 oz glass of hard liquor (44 mL). General information Avoid eating more than 2,300 mg of salt a day. If you have hypertension, you may need to reduce your sodium intake to 1,500 mg a day. Work with your provider to stay at a healthy body weight or lose weight. Ask what the best weight range is for you. On most days of the week, get at least 30 minutes of exercise that causes your heart to beat faster. This may include walking, swimming, or biking. Work with your provider or dietitian to adjust your eating plan to meet your specific calorie needs. What foods should I eat? Fruits All fresh, dried, or frozen fruit. Canned fruits that are in their natural juice and do not have sugar added to them. Vegetables Fresh or frozen vegetables that are raw, steamed, roasted, or grilled. Low-sodium or reduced-sodium tomato and vegetable juice.  Low-sodium or reduced-sodium tomato sauce and tomato paste. Low-sodium or reduced-sodium canned vegetables. Grains Whole-grain or whole-wheat bread. Whole-grain or whole-wheat pasta. Brown rice. Orpah Cobb. Bulgur. Whole-grain and low-sodium cereals. Pita bread. Low-fat, low-sodium crackers. Whole-wheat flour tortillas. Meats and other proteins Skinless chicken or [place]. Ground chicken or [place]. Pork with fat trimmed off. Fish and seafood. Egg whites. Dried beans, peas, or lentils. Unsalted nuts, nut butters, and seeds. Unsalted canned beans. Lean cuts of beef with fat trimmed off. Low-sodium, lean precooked or cured meat, such as sausages or meat loaves. Dairy Low-fat (1%) or fat-free (skim) milk. Reduced-fat, low-fat, or fat-free cheeses. Nonfat, low-sodium ricotta or cottage cheese. Low-fat or nonfat yogurt. Low-fat, low-sodium cheese. Fats and oils Soft margarine without trans fats. Vegetable oil. Reduced-fat, low-fat, or light mayonnaise and salad dressings (reduced-sodium). Canola, safflower, olive, avocado, soybean, and  sunflower oils. Avocado. Seasonings and condiments Herbs. Spices. Seasoning mixes without salt. Other foods Unsalted popcorn and pretzels. Fat-free sweets. The items listed above may not be all the foods and drinks you can have. Talk to a dietitian to learn more. What foods should I avoid? Fruits Canned fruit in a light or heavy syrup. Fried fruit. Fruit in cream or butter sauce. Vegetables Creamed or fried vegetables. Vegetables in a cheese sauce. Regular canned vegetables that are not marked as low-sodium or reduced-sodium. Regular canned tomato sauce and paste that are not marked as low-sodium or reduced-sodium. Regular tomato and vegetable juices that are not marked as low-sodium or reduced-sodium. Rosita Fire. Olives. Grains Baked goods made with fat, such as croissants, muffins, or some breads. Dry pasta or rice meal packs. Meats and other proteins Fatty cuts of meat. Ribs. Fried meat. Tomasa Blase. Bologna, salami, and other precooked or cured meats, such as sausages or meat loaves, that are not lean and low in sodium. Fat from the back of a pig (fatback). Bratwurst. Salted nuts and seeds. Canned beans with added salt. Canned or smoked fish. Whole eggs or egg yolks. Chicken or [place] with skin. Dairy Whole or 2% milk, cream, and half-and-half. Whole or full-fat cream cheese. Whole-fat or sweetened yogurt. Full-fat cheese. Nondairy creamers. Whipped toppings. Processed cheese and cheese spreads. Fats and oils Butter. Stick margarine. Lard. Shortening. Ghee. Bacon fat. Tropical oils, such as coconut, palm kernel, or palm oil. Seasonings and condiments Onion salt, garlic salt, seasoned salt, table salt, and sea salt. Worcestershire sauce. Tartar sauce. Barbecue sauce. Teriyaki sauce. Soy sauce, including reduced-sodium soy sauce. Steak sauce. Canned and packaged gravies. Fish sauce. Oyster sauce. Cocktail sauce. Store-bought horseradish. Ketchup. Mustard. Meat flavorings and tenderizers. Bouillon cubes.  Hot sauces. Pre-made or packaged marinades. Pre-made or packaged taco seasonings. Relishes. Regular salad dressings. Other foods Salted popcorn and pretzels. The items listed above may not be all the foods and drinks you should avoid. Talk to a dietitian to learn more. Where to find more information National Heart, Lung, and Blood Institute (NHLBI): BuffaloDryCleaner.gl American Heart Association (AHA): heart.org Academy of Nutrition and Dietetics: eatright.org National Kidney Foundation (NKF): kidney.org This information is not intended to replace advice given to you by your health care provider. Make sure you discuss any questions you have with your health care provider. Document Revised: 06/27/2022 Document Reviewed: 06/27/2022 Elsevier Patient Education  2024 Elsevier Inc.  Adopting a Healthy Lifestyle.   Weight: Know what a healthy weight is for you (roughly BMI <25) and aim to maintain this. You can calculate your body mass index on your smart phone. Unfortunately, this is not the most  accurate measure of healthy weight, but it is the simplest measurement to use. A more accurate measurement involves body scanning which measures lean muscle, fat tissue and bony density. We do not have this equipment at [facility].    Diet: Aim for 7+ servings of fruits and vegetables daily Limit animal fats in diet for cholesterol and heart health - choose grass fed whenever available Avoid highly processed foods (fast food burgers, tacos, fried chicken, pizza, hot dogs, french fries)  Saturated fat comes in the form of butter, lard, coconut oil, margarine, partially hydrogenated oils, and fat in meat. These increase your risk of cardiovascular disease.  Use healthy plant oils, such as olive, canola, soy, corn, sunflower and peanut.  Whole foods such as fruits, vegetables and whole grains have fiber  Men need > 38 grams of fiber per day Women need > 25 grams of fiber per day  Load up on vegetables and fruits -  one-half of your plate: Aim for color and variety, and remember that potatoes dont count. Go for whole grains - one-quarter of your plate: Whole wheat, barley, wheat berries, quinoa, oats, brown rice, and foods made with them. If you want pasta, go with whole wheat pasta. Protein power - one-quarter of your plate: Fish, chicken, beans, and nuts are all healthy, versatile protein sources. Limit red meat. You need carbohydrates for energy! The type of carbohydrate is more important than the amount. Choose carbohydrates such as vegetables, fruits, whole grains, beans, and nuts in the place of white rice, white pasta, potatoes (baked or fried), macaroni and cheese, cakes, cookies, and donuts.  If youre thirsty, drink water. Coffee and tea are good in moderation, but skip sugary drinks and limit milk and dairy products to one or two daily servings. Keep sugar intake at 6 teaspoons or 24 grams or LESS       Exercise: Aim for 150 min of moderate intensity exercise weekly for heart health, and weights twice weekly for bone health Stay active - any steps are better than no steps! Aim for 7-9 hours of sleep daily

## 2023-05-02 ENCOUNTER — Telehealth (HOSPITAL_COMMUNITY): Payer: Self-pay | Admitting: *Deleted

## 2023-05-02 NOTE — Telephone Encounter (Signed)
Spoke with patient and he was give detailed instructions about his STRESS TEST on 05/09/23 at 7:45.

## 2023-05-08 ENCOUNTER — Encounter (HOSPITAL_COMMUNITY): Payer: 59

## 2023-05-09 ENCOUNTER — Ambulatory Visit (HOSPITAL_COMMUNITY): Payer: 59 | Attending: Physician Assistant

## 2023-05-12 ENCOUNTER — Telehealth (HOSPITAL_COMMUNITY): Payer: Self-pay | Admitting: *Deleted

## 2023-05-12 NOTE — Telephone Encounter (Signed)
Left message on voicemail per DPR in reference to upcoming appointment scheduled on  05/14/23 with detailed instructions given per Myocardial Perfusion Study Information Sheet for the test. LM to arrive 15 minutes early, and that it is imperative to arrive on time for appointment to keep from having the test rescheduled. If you need to cancel or reschedule your appointment, please call the office within 24 hours of your appointment. Failure to do so may result in a cancellation of your appointment, and a $50 no show fee. Phone number given for call back for any questions. Ricky Ala

## 2023-05-14 ENCOUNTER — Ambulatory Visit (HOSPITAL_COMMUNITY): Payer: 59 | Attending: Physician Assistant

## 2023-05-14 ENCOUNTER — Telehealth (HOSPITAL_COMMUNITY): Payer: Self-pay | Admitting: Physician Assistant

## 2023-05-14 NOTE — Telephone Encounter (Signed)
Just an FYI. Patient has NO SHOWED Myoview the times below:  05/14/23 NO SHOWED x 2  05/09/23 NO SHOWED 05/08/23 Pt cancelled    Patient will not be called to reschedule. Order will be removed from the active NUC wq. If patient calls back to reschedule we can 1 more time and will reinstate the order.    Thank you

## 2023-05-24 ENCOUNTER — Other Ambulatory Visit: Payer: Self-pay | Admitting: Physician Assistant

## 2023-05-24 DIAGNOSIS — J439 Emphysema, unspecified: Secondary | ICD-10-CM

## 2023-08-06 ENCOUNTER — Ambulatory Visit: Payer: 59 | Attending: Cardiology | Admitting: Cardiology

## 2023-12-03 NOTE — Progress Notes (Deleted)
 Cardiology Office Note:  .   Date:  12/03/2023  ID:  Aaron Fuller, DOB Apr 26, 1956, MRN [id number] PCP: Belva Boyden, PA  [facility] Providers Cardiologist:  Gaylyn Keas, MD { Click to update primary MD,subspecialty MD or APP then REFRESH:1}   History of Present Illness: Aaron Fuller   Aaron Fuller is a 68 y.o. male   with a past medical history of HTN, alcohol dependence, tobacco abuse, PAF on chronic anticoagulation, chronic HFpEF, normal coronary arteries by cardiac cath 09/2019, pulmonary hypertension, COPD, celiac artery stenosis and hepatitis C   LOV 04/2023 chest pain, elevated BP and HR despite metoprolol  100 mg in am 25 mg in pm. Amlodipine  added and NST ordered but not done.  ROS: ***  Studies Reviewed: Aaron Fuller         Prior CV Studies: {Select studies to display:26339}  [facility] 10/2021 CONCLUSIONS: Moderate pulmonary hypertension, likely WHO group 3, or combined group 2 and 3. Pulmonary vascular resistance 4.8 Woods units Mean right atrial pressure 16, PAPI 2.8 Cardiac output 5.2 L/min Cardiac index 2.74 L/min/m Mixed venous/main pulmonary artery O2 saturation 68%   RECOMMENDATIONS: Plan Per treating team.  Right atrial pressures are elevated.  Left heart hemodynamics suggest compensated heart failure at this point.    Echo 11/13/21   1. Left ventricular ejection fraction, by estimation, is 40 to 45%. The  left ventricle has mildly decreased function. The left ventricle  demonstrates global hypokinesis. There is severe concentric left  ventricular hypertrophy. Left ventricular diastolic   parameters are consistent with Grade II diastolic dysfunction  (pseudonormalization). There is the interventricular septum is flattened  in diastole ('D' shaped left ventricle), consistent with right ventricular  volume overload.   2. Right ventricular systolic function is moderately reduced. The right  ventricular size is moderately enlarged. Mildly increased right  ventricular  wall thickness. There is moderately elevated pulmonary artery  systolic pressure. The estimated right  ventricular systolic pressure is 51.7 mmHg.   3. Left atrial size was mildly dilated.   4. Right atrial size was severely dilated.   5. A small pericardial effusion is present. No evidence of tamponade.   6. The mitral valve is grossly normal. Trivial mitral valve  regurgitation.   7. Tricuspid valve regurgitation is severe.   8. The aortic valve is tricuspid. Aortic valve regurgitation is mild to  moderate.   9. Pulmonic valve regurgitation is moderate.  10. Moderately dilated pulmonary artery.  11. The inferior vena cava is dilated in size with <50% respiratory  variability, suggesting right atrial pressure of 15 mmHg.   Comparison(s): Significant changes: worsening LVEF with torrential,  functional tricuspid regurgitation.   Conclusion(s)/Recommendation(s): Notable hypertrophy, atrial dilation, and  effusion: amyloid nuclear imaging may be indicated as outpatient.       Risk Assessment/Calculations:   {Does this patient have ATRIAL FIBRILLATION?:[id number]} No BP recorded.  {Refresh Note OR Click here to enter BP  :1}***       Physical Exam:   VS:  There were no vitals taken for this visit.   Wt Readings from Last 3 Encounters:  04/29/23 172 lb 12.8 oz (78.4 kg)  05/10/22 169 lb (76.7 kg)  11/18/21 161 lb 12.8 oz (73.4 kg)    GEN: Well nourished, well developed in no acute distress NECK: No JVD; No carotid bruits CARDIAC: ***RRR, no murmurs, rubs, gallops RESPIRATORY:  Clear to auscultation without rales, wheezing or rhonchi  ABDOMEN: Soft, non-tender, non-distended EXTREMITIES:  No edema; No deformity  ASSESSMENT AND PLAN: .    PAF -no further issues -he is on metoprolol  BID   Chronic combined CHF -No swelling in his legs -continue current medications, no need for additional diuretics at this moment   Chest Pain Intermittent chest tightness, worse at night  and sometimes with activity. No associated dyspnea. EKG shows tachycardia. -Order chemical stress test to evaluate for ischemic heart disease. -Prescribe nitroglycerin  sublingual tablets for use as needed for chest pain.   Hypertension Blood pressure elevated at 172/88 despite current treatment with metoprolol  100mg  in the morning and 25mg  in the evening. -Add amlodipine  5mg  daily. -Advise patient's wife to monitor blood pressure at home for the next two weeks, an hour after morning and evening medications, and report readings.   COPD/tobacco dependence Patient reports running out of inhaler medication. -Refill inhaler prescriptions for one month until follow-up with primary care provider. -Refill nicotine  patches 14mg  for smoking cessation.         {Are you ordering a CV Procedure (e.g. stress test, cath, DCCV, TEE, etc)?   Press F2        :[id number]}  Dispo: ***  Signed, Theotis Flake, PA-C

## 2023-12-15 ENCOUNTER — Ambulatory Visit: Attending: Physician Assistant | Admitting: Physician Assistant

## 2023-12-17 ENCOUNTER — Encounter: Payer: Self-pay | Admitting: Physician Assistant

## 2024-01-06 ENCOUNTER — Encounter: Payer: Self-pay | Admitting: Cardiology

## 2024-01-06 ENCOUNTER — Ambulatory Visit: Attending: Cardiology | Admitting: Cardiology

## 2024-01-06 VITALS — BP 182/100 | HR 94 | Ht 68.0 in | Wt 168.6 lb

## 2024-01-06 DIAGNOSIS — I272 Pulmonary hypertension, unspecified: Secondary | ICD-10-CM

## 2024-01-06 DIAGNOSIS — I4892 Unspecified atrial flutter: Secondary | ICD-10-CM | POA: Diagnosis not present

## 2024-01-06 DIAGNOSIS — E785 Hyperlipidemia, unspecified: Secondary | ICD-10-CM

## 2024-01-06 DIAGNOSIS — I10 Essential (primary) hypertension: Secondary | ICD-10-CM

## 2024-01-06 DIAGNOSIS — F17200 Nicotine dependence, unspecified, uncomplicated: Secondary | ICD-10-CM

## 2024-01-06 DIAGNOSIS — Z79899 Other long term (current) drug therapy: Secondary | ICD-10-CM

## 2024-01-06 DIAGNOSIS — I5032 Chronic diastolic (congestive) heart failure: Secondary | ICD-10-CM

## 2024-01-06 MED ORDER — AMLODIPINE BESYLATE 10 MG PO TABS: 10.0000 mg | ORAL_TABLET | Freq: Every day | ORAL | 3 refills | Status: AC

## 2024-01-06 NOTE — Progress Notes (Signed)
 Cardiology Office Note:    Date:  01/06/2024   ID:  Aaron Fuller, DOB 1956-06-24, MRN [id number]  PCP:  Lesa Jon HERO, PA  Cardiologist:  Wilbert Bihari, MD    Referring MD: Lesa Jon HERO*   Chief Complaint  Patient presents with   Congestive Heart Failure   Atrial Flutter   Hypertension   Hyperlipidemia    History of Present Illness:    Aaron Fuller is a 68 y.o. male with a hx of HTN, alcohol dependence, tobacco abuse, paroxysmal atrial flutter on chronic a/c with Eliquis  for CHADS2VASC score of 2, chronic diastolic CHF, cardiac cath 09/29/19 with normal coronary arteries and elevated filling pressures.  RHC showed pulmonary HTN and PFTs c/w severe COPD now followed by pulmonary.     The patient is here for follow-up today.  He has chronic DOE related to COPD which is stable on inhalers and O2. He denies any chest pain or pressure,  PND, orthopnea, lower extremity edema, dizziness or syncope.  He does notice he hear beat faster when he gets out of bed in the am but no irregularity.   Past Medical History:  Diagnosis Date   Alcohol dependence (HCC)    Chronic diastolic CHF (congestive heart failure) (HCC)    normal coronary arteries on cath   COPD (chronic obstructive pulmonary disease) (HCC)    Hypertension    Paroxysmal atrial flutter (HCC)    Pulmonary HTN (HCC)    secondary to COPD   Tobacco dependence     Past Surgical History:  Procedure Laterality Date   RIGHT HEART CATH N/A 11/16/2021   Procedure: RIGHT HEART CATH;  Surgeon: Claudene Victory ORN, MD;  Location: [facility] INVASIVE CV LAB;  Service: Cardiovascular;  Laterality: N/A;   RIGHT/LEFT HEART CATH AND CORONARY ANGIOGRAPHY Bilateral 09/29/2019   Procedure: RIGHT/LEFT HEART CATH AND CORONARY ANGIOGRAPHY;  Surgeon: Burnard Debby LABOR, MD;  Location: [facility] INVASIVE CV LAB;  Service: Cardiovascular;  Laterality: Bilateral;   ulcer surgery      Current Medications: Current Meds  Medication Sig   albuterol  (VENTOLIN   HFA) 108 (90 Base) MCG/ACT inhaler Inhale 2 puffs into the lungs every 6 (six) hours as needed for wheezing or shortness of breath.   amLODipine  (NORVASC ) 5 MG tablet Take 1 tablet (5 mg total) by mouth daily.   apixaban  (ELIQUIS ) 5 MG TABS tablet Take 1 tablet (5 mg total) by mouth 2 (two) times daily.   atorvastatin  (LIPITOR) 20 MG tablet Take 1 tablet (20 mg total) by mouth daily at 6 PM.   diphenhydrAMINE (BENADRYL) 25 mg capsule Take by mouth as needed for allergies.   folic acid  (FOLVITE ) 1 MG tablet Take 1 tablet (1 mg total) by mouth daily.   furosemide  (LASIX ) 20 MG tablet Take 1 tablet (20 mg total) by mouth 2 (two) times daily.   metoprolol  succinate (TOPROL -XL) 100 MG 24 hr tablet Take 100 mg by mouth at bedtime.   metoprolol  succinate (TOPROL -XL) 25 MG 24 hr tablet Take 1 tablet (25 mg total) by mouth daily.   Multiple Vitamin (MULTIVITAMIN WITH MINERALS) TABS tablet Take 1 tablet by mouth daily.   nicotine  (NICODERM CQ  - DOSED IN MG/24 HOURS) 14 mg/24hr patch Place 1 patch (14 mg total) onto the skin daily.   nitroGLYCERIN  (NITROSTAT ) 0.4 MG SL tablet Place 1 tablet (0.4 mg total) under the tongue every 5 (five) minutes as needed for chest pain.   OXYGEN Inhale 4 L into the lungs at bedtime.  OXYGEN Inhale into the lungs.   thiamine  100 MG tablet Take 1 tablet (100 mg total) by mouth daily.   Tiotropium Bromide  Monohydrate (SPIRIVA  RESPIMAT) 2.5 MCG/ACT AERS INHALE 2 SPRAY(S) BY MOUTH ONCE DAILY   umeclidinium-vilanterol (ANORO ELLIPTA ) 62.5-25 MCG/ACT AEPB Inhale 1 puff into the lungs daily.   vitamin B-12 (CYANOCOBALAMIN) 500 MCG tablet Take 500 mcg by mouth daily.     Allergies:   Patient has no active allergies.   Social History   Socioeconomic History   Marital status: Single    Spouse name: Not on file   Number of children: Not on file   Years of education: Not on file   Highest education level: Not on file  Occupational History   Occupation: retired Investment banker, operational  Tobacco  Use   Smoking status: Every Day    Current packs/day: 1.00    Average packs/day: 1 pack/day for 49.0 years (49.0 ttl pk-yrs)    Types: Cigarettes   Smokeless tobacco: Never   Tobacco comments:    currently 0.5  Vaping Use   Vaping status: Never Used  Substance and Sexual Activity   Alcohol use: Not Currently   Drug use: Not Currently   Sexual activity: Not on file  Other Topics Concern   Not on file  Social History Narrative   Not on file   Social Drivers of Health   Financial Resource Strain: Low Risk  (07/18/2023)   Received from [facility]   Overall Financial Resource Strain (CARDIA)    Difficulty of Paying Living Expenses: Not hard at all  Food Insecurity: No Food Insecurity (07/18/2023)   [facility]   Hunger Vital Sign    Within the past 12 months, you worried that your food would run out before you got the money to buy more.: Never true    Within the past 12 months, the food you bought just didn't last and you didn't have money to get more.: Never true  Transportation Needs: No Transportation Needs (07/18/2023)   Received from [facility] - Transportation    Lack of Transportation (Medical): No    Lack of Transportation (Non-Medical): No  Physical Activity: Unknown (03/04/2022)   Received from [facility]   Exercise Vital Sign    On average, how many days per week do you engage in moderate to strenuous exercise (like a brisk walk)?: 0 days    Minutes of Exercise per Session: Not on file  Stress: No Stress Concern Present (03/04/2022)   Received from [facility] - Occupational Stress Questionnaire    Feeling of Stress : Not at all  Social Connections: Unknown (03/08/2023)   Received from [facility]   Social Network    Social Network: Not on file     Family History: The patient's family history includes CAD in his father and mother; Diabetes in his mother and sister; Heart failure in his  mother.  ROS:   Please see the history of present illness.    ROS  All other systems reviewed and negative.   EKGs/Labs/Other Studies Reviewed:    The following studies were reviewed today: Cardiac cath and 2D echo    Recent Labs: No results found for requested labs within last 365 days.   Recent Lipid Panel    Component Value Date/Time   CHOL 186 09/28/2019 1104   TRIG 71 09/28/2019 1104   HDL 101 09/28/2019 1104   CHOLHDL 1.8 09/28/2019  1104   VLDL 14 09/28/2019 1104   LDLCALC 71 09/28/2019 1104    Physical Exam:    VS:  BP (!) 182/100   Pulse 94   Ht 5' 8 (1.727 m)   Wt 168 lb 9.6 oz (76.5 kg)   SpO2 94%   BMI 25.64 kg/m     Wt Readings from Last 3 Encounters:  01/06/24 168 lb 9.6 oz (76.5 kg)  04/29/23 172 lb 12.8 oz (78.4 kg)  05/10/22 169 lb (76.7 kg)     GEN: Well nourished, well developed in no acute distress HEENT: Normal NECK: No JVD; No carotid bruits LYMPHATICS: No lymphadenopathy CARDIAC:RRR, no murmurs, rubs, gallops RESPIRATORY:  decreased BS throughout both lung fields with scattered wheezing ABDOMEN: Soft, non-tender, non-distended MUSCULOSKELETAL:  No edema; No deformity  SKIN: Warm and dry NEUROLOGIC:  Alert and oriented x 3 PSYCHIATRIC:  Normal affect   ASSESSMENT:    1. Chronic diastolic CHF (congestive heart failure) (HCC)   2. Pulmonary hypertension, unspecified (HCC)   3. Paroxysmal atrial flutter (HCC)   4. Essential hypertension   5. Hyperlipidemia LDL goal <70   6. Tobacco dependence     PLAN:    In order of problems listed above:  #Chronic diastolic heart failure -Echo with EF 55-60%, no WMA, G1DD, moderately reduced RV function, mild LVH, mod AI -Etiology of right heart failure and pulmonary HTN likely related to severe COPD noted on PFTs -he has had a lot of problems with fluid related to dietary indiscretion with Na intake. -He has chronic lower extremity edema controlled on diuretics -Encouraged him to  continue less than 2 g sodium diet -Continue Lasix  20 mg twice daily -I have personally reviewed and interpreted outside labs performed by patient's PCP which showed serum creatinine 1.33 and potassium 4.5 on 12/29/2023  #Moderate pulmonary HTN  - related to severe COPD and diastolic CHF - PVR borderline at 2.9WU - PFTs  with severe COPD - He is followed by pulmonary and is on inhalers as well as O2 at 4 L during sleep   #Paroxysmal atrial flutter - TSH normal - He remains in normal sinus rhythm  - No bleeding issues on DOAC - CHADS2VASC score is 2 - Continue Eliquis  5 mg twice daily and Toprol  XL 100 mg daily with as needed refills - I have personally reviewed and interpreted outside labs performed by patient's PCP which showed hemoglobin 15.8 on 12/23/2023   #HTN - BP very high today on exam -continue Toprol  XL 100 mg daily with as needed refills -increase Amlodipine  to 10mg  daily -nurse visit in 1 week for BP check   #HLD -LDL goal < 70 -I have personally reviewed and interpreted outside labs performed by patient's PCP which showed ALT 11 on 12/23/2023 -Continue atorvastatin  20 mg daily with as needed refills -Repeat FLP   #Tobacco dependence - he has gotten down to 1/2ppd - PFTs c/w severe COPD - Remains on O2 at 4 L nightly and 3L during the day per pulmonary - encouraged him to get off  tobacco  Followup with extender in 2 months    Medication Adjustments/Labs and Tests Ordered: Current medicines are reviewed at length with the patient today.  Concerns regarding medicines are outlined above.  No orders of the defined types were placed in this encounter.  No orders of the defined types were placed in this encounter.   Signed, Wilbert Bihari, MD  01/06/2024 11:29 AM    [facility]

## 2024-01-06 NOTE — Addendum Note (Signed)
 Addended by: JANIT GENI CROME on: 01/06/2024 11:54 AM   Modules accepted: Orders

## 2024-01-06 NOTE — Addendum Note (Signed)
 Addended by: JANIT GENI CROME on: 01/06/2024 11:48 AM   Modules accepted: Orders

## 2024-01-06 NOTE — Patient Instructions (Signed)
 Medication Instructions:  Please increase your dose of amlodipine  to 10 mg daily.   *If you need a refill on your cardiac medications before your next appointment, please call your pharmacy*  Lab Work: Please complete a FASTING lipid panel in our first floor lab before you leave today.   If you have labs (blood work) drawn today and your tests are completely normal, you will receive your results only by: MyChart Message (if you have MyChart) OR A paper copy in the mail If you have any lab test that is abnormal or we need to change your treatment, we will call you to review the results.  Testing/Procedures: None.  Follow-Up: At [facility], you and your health needs are our priority.  As part of our continuing mission to provide you with exceptional heart care, our providers are all part of one team.  This team includes your primary Cardiologist (physician) and Advanced Practice Providers or APPs (Physician Assistants and Nurse Practitioners) who all work together to provide you with the care you need, when you need it.  Your next appointment:   2 month(s)  Provider:   CHARLENA Alberts NP, Glendia Ferrier, PA, Damien Braver, [place], Orren Fabry, [place]  We recommend signing up for the patient portal called MyChart.  Sign up information is provided on this After Visit Summary.  MyChart is used to connect with patients for Virtual Visits (Telemedicine).  Patients are able to view lab/test results, encounter notes, upcoming appointments, etc.  Non-urgent messages can be sent to your provider as well.   To learn more about what you can do with MyChart, go to [email or web address].   Other Instructions

## 2024-01-14 ENCOUNTER — Ambulatory Visit: Attending: Cardiovascular Disease | Admitting: *Deleted

## 2024-01-14 DIAGNOSIS — I10 Essential (primary) hypertension: Secondary | ICD-10-CM

## 2024-01-14 NOTE — Patient Instructions (Signed)
   Nurse Visit   Date of Encounter: 01/14/2024 ID: Aaron Fuller, DOB 11-30-1955, MRN [id number]  PCP:  Lesa Jon HERO, PA   [facility] Providers Cardiologist:  Wilbert Bihari, MD      Visit Details   VS:  BP (!) 168/76 (BP Location: Right Arm, Patient Position: Sitting, Cuff Size: Normal)   Wt 168 lb (76.2 kg)   BMI 25.54 kg/m  , BMI Body mass index is 25.54 kg/m.  Wt Readings from Last 3 Encounters:  01/14/24 168 lb (76.2 kg)  01/06/24 168 lb 9.6 oz (76.5 kg)  04/29/23 172 lb 12.8 oz (78.4 kg)     Reason for visit: blood pressure check Performed today: blood pressure taken, routed and give to DOD. Dr. Copper Changes (medications, testing, etc.) : no changes Length of Visit: 10 minutes    Medications Adjustments/Labs and Tests Ordered: No orders of the defined types were placed in this encounter.  No orders of the defined types were placed in this encounter.    Bonney Trudy Frankey JONELLE, RN  01/14/2024 11:14 AM

## 2024-02-25 NOTE — Progress Notes (Deleted)
 Cardiology Office Note:  .   Date:  02/25/2024  ID:  Aaron Fuller, DOB 01/20/1956, MRN [id number] PCP: Lesa Jon HERO, PA  [facility] Providers Cardiologist:  Wilbert Bihari, MD { Click to update primary MD,subspecialty MD or APP then REFRESH:1}   History of Present Illness: Aaron   Aaron Fuller is a 68 y.o. male with a hx of HTN, alcohol dependence, tobacco abuse, paroxysmal atrial flutter on chronic a/c with Eliquis  for CHADS2VASC score of 2, chronic diastolic CHF, cardiac cath 09/29/19 with normal coronary arteries and elevated filling pressures. RHC showed pulmonary HTN and PFTs c/w severe COPD   ROS: ***  Studies Reviewed: Aaron         Prior CV Studies: {Select studies to display:26339}  CONCLUSIONS: Moderate pulmonary hypertension, likely WHO group 3, or combined group 2 and 3. Pulmonary vascular resistance 4.8 Woods units Mean right atrial pressure 16, PAPI 2.8 Cardiac output 5.2 L/min Cardiac index 2.74 L/min/m Mixed venous/main pulmonary artery O2 saturation 68%   RECOMMENDATIONS: Plan Per treating team.  Right atrial pressures are elevated.  Left heart hemodynamics suggest compensated heart failure at this point.    Risk Assessment/Calculations:   {Does this patient have ATRIAL FIBRILLATION?:[phone]} No BP recorded.  {Refresh Note OR Click here to enter BP  :1}***       Physical Exam:   VS:  There were no vitals taken for this visit.   Orhtostatics: No data found. Wt Readings from Last 3 Encounters:  01/14/24 168 lb (76.2 kg)  01/06/24 168 lb 9.6 oz (76.5 kg)  04/29/23 172 lb 12.8 oz (78.4 kg)    GEN: Well nourished, well developed in no acute distress NECK: No JVD; No carotid bruits CARDIAC: ***RRR, no murmurs, rubs, gallops RESPIRATORY:  Clear to auscultation without rales, wheezing or rhonchi  ABDOMEN: Soft, non-tender, non-distended EXTREMITIES:  No edema; No deformity   ASSESSMENT AND PLAN: .    Chronic diastolic heart failure -Echo with EF  55-60%, no WMA, G1DD, moderately reduced RV function, mild LVH, mod AI -Etiology of right heart failure and pulmonary HTN likely related to severe COPD noted on PFTs -he has had a lot of problems with fluid related to dietary indiscretion with Na intake. -He has chronic lower extremity edema controlled on diuretics -Encouraged him to continue less than 2 g sodium diet -Continue Lasix  20 mg twice daily -I have personally reviewed and interpreted outside labs performed by patient's PCP which showed serum creatinine 1.33 and potassium 4.5 on 12/29/2023   #Moderate pulmonary HTN  - related to severe COPD and diastolic CHF - PVR borderline at 2.9WU - PFTs  with severe COPD - He is followed by pulmonary and is on inhalers as well as O2 at 4 L during sleep   #Paroxysmal atrial flutter - TSH normal - He remains in normal sinus rhythm  - No bleeding issues on DOAC - CHADS2VASC score is 2 - Continue Eliquis  5 mg twice daily and Toprol  XL 100 mg daily with as needed refills - I have personally reviewed and interpreted outside labs performed by patient's PCP which showed hemoglobin 15.8 on 12/23/2023   #HTN - BP very high today on exam -continue Toprol  XL 100 mg daily with as needed refills -increase Amlodipine  to 10mg  daily -nurse visit in 1 week for BP check   #HLD -LDL goal < 70 -I have personally reviewed and interpreted outside labs performed by patient's PCP which showed ALT 11 on 12/23/2023 -Continue atorvastatin  20 mg daily  with as needed refills -Repeat FLP   #Tobacco dependence - he has gotten down to 1/2ppd - PFTs c/w severe COPD - Remains on O2 at 4 L nightly and 3L during the day per pulmonary - encouraged him to get off  tobacco       {Are you ordering a CV Procedure (e.g. stress test, cath, DCCV, TEE, etc)?   Press F2        :[id number]}  Dispo: ***  Signed, Olivia Pavy, PA-C

## 2024-03-09 ENCOUNTER — Ambulatory Visit: Attending: Physician Assistant | Admitting: Physician Assistant

## 2024-06-21 ENCOUNTER — Telehealth: Payer: Self-pay

## 2024-06-21 NOTE — Telephone Encounter (Signed)
 TC to patient as notification was received that he possibly has not been filling his eliquis . Left voice mail asking recipient to call our office.

## 2024-07-08 ENCOUNTER — Telehealth: Payer: Self-pay

## 2024-07-08 NOTE — Telephone Encounter (Signed)
 Call to patient to see if he is still on eliquis . Previous attempt to call 06/21/24. Patient states he is still taking eliquis , states he is out of some of his other medications but he doesn't know which ones. Asked patient to check his medications when he gets home and let us  know what he needs refilled, patient verbalizes understanding.

## 2024-07-09 ENCOUNTER — Other Ambulatory Visit: Payer: Self-pay

## 2024-07-09 ENCOUNTER — Observation Stay (HOSPITAL_COMMUNITY)
Admission: EM | Admit: 2024-07-09 | Discharge: 2024-07-11 | Disposition: A | Attending: Emergency Medicine | Admitting: Emergency Medicine

## 2024-07-09 ENCOUNTER — Emergency Department (HOSPITAL_COMMUNITY)

## 2024-07-09 ENCOUNTER — Encounter (HOSPITAL_COMMUNITY): Payer: Self-pay

## 2024-07-09 DIAGNOSIS — F1090 Alcohol use, unspecified, uncomplicated: Secondary | ICD-10-CM | POA: Diagnosis present

## 2024-07-09 DIAGNOSIS — N183 Chronic kidney disease, stage 3 unspecified: Secondary | ICD-10-CM | POA: Diagnosis present

## 2024-07-09 DIAGNOSIS — N1831 Chronic kidney disease, stage 3a: Secondary | ICD-10-CM | POA: Insufficient documentation

## 2024-07-09 DIAGNOSIS — Z7901 Long term (current) use of anticoagulants: Secondary | ICD-10-CM | POA: Diagnosis not present

## 2024-07-09 DIAGNOSIS — Z79899 Other long term (current) drug therapy: Secondary | ICD-10-CM | POA: Insufficient documentation

## 2024-07-09 DIAGNOSIS — I130 Hypertensive heart and chronic kidney disease with heart failure and stage 1 through stage 4 chronic kidney disease, or unspecified chronic kidney disease: Secondary | ICD-10-CM | POA: Insufficient documentation

## 2024-07-09 DIAGNOSIS — I270 Primary pulmonary hypertension: Secondary | ICD-10-CM | POA: Diagnosis not present

## 2024-07-09 DIAGNOSIS — I4892 Unspecified atrial flutter: Secondary | ICD-10-CM | POA: Diagnosis not present

## 2024-07-09 DIAGNOSIS — I5042 Chronic combined systolic (congestive) and diastolic (congestive) heart failure: Secondary | ICD-10-CM | POA: Insufficient documentation

## 2024-07-09 DIAGNOSIS — J9611 Chronic respiratory failure with hypoxia: Secondary | ICD-10-CM | POA: Diagnosis present

## 2024-07-09 DIAGNOSIS — J441 Chronic obstructive pulmonary disease with (acute) exacerbation: Secondary | ICD-10-CM | POA: Diagnosis not present

## 2024-07-09 DIAGNOSIS — E785 Hyperlipidemia, unspecified: Secondary | ICD-10-CM | POA: Diagnosis not present

## 2024-07-09 DIAGNOSIS — I5022 Chronic systolic (congestive) heart failure: Secondary | ICD-10-CM | POA: Diagnosis present

## 2024-07-09 DIAGNOSIS — R0602 Shortness of breath: Principal | ICD-10-CM

## 2024-07-09 DIAGNOSIS — I509 Heart failure, unspecified: Secondary | ICD-10-CM

## 2024-07-09 DIAGNOSIS — I272 Pulmonary hypertension, unspecified: Secondary | ICD-10-CM | POA: Diagnosis present

## 2024-07-09 DIAGNOSIS — F17200 Nicotine dependence, unspecified, uncomplicated: Secondary | ICD-10-CM | POA: Diagnosis not present

## 2024-07-09 DIAGNOSIS — I10 Essential (primary) hypertension: Secondary | ICD-10-CM | POA: Diagnosis present

## 2024-07-09 LAB — CBC
HCT: 40.7 % (ref 39.0–52.0)
Hemoglobin: 14 g/dL (ref 13.0–17.0)
MCH: 31.9 pg (ref 26.0–34.0)
MCHC: 34.4 g/dL (ref 30.0–36.0)
MCV: 92.7 fL (ref 80.0–100.0)
Platelets: 144 K/uL — ABNORMAL LOW (ref 150–400)
RBC: 4.39 MIL/uL (ref 4.22–5.81)
RDW: 15.8 % — ABNORMAL HIGH (ref 11.5–15.5)
WBC: 12.3 K/uL — ABNORMAL HIGH (ref 4.0–10.5)
nRBC: 0 % (ref 0.0–0.2)

## 2024-07-09 LAB — BASIC METABOLIC PANEL WITH GFR
Anion gap: 13 (ref 5–15)
BUN: 15 mg/dL (ref 8–23)
CO2: 25 mmol/L (ref 22–32)
Calcium: 9 mg/dL (ref 8.9–10.3)
Chloride: 95 mmol/L — ABNORMAL LOW (ref 98–111)
Creatinine, Ser: 1.44 mg/dL — ABNORMAL HIGH (ref 0.61–1.24)
GFR, Estimated: 52 mL/min — ABNORMAL LOW
Glucose, Bld: 115 mg/dL — ABNORMAL HIGH (ref 70–99)
Potassium: 3.9 mmol/L (ref 3.5–5.1)
Sodium: 133 mmol/L — ABNORMAL LOW (ref 135–145)

## 2024-07-09 LAB — RESP PANEL BY RT-PCR (RSV, FLU A&B, COVID)  RVPGX2
Influenza A by PCR: NEGATIVE
Influenza B by PCR: NEGATIVE
Resp Syncytial Virus by PCR: NEGATIVE
SARS Coronavirus 2 by RT PCR: NEGATIVE

## 2024-07-09 LAB — TROPONIN T, HIGH SENSITIVITY
Troponin T High Sensitivity: 36 ng/L — ABNORMAL HIGH (ref 0–19)
Troponin T High Sensitivity: 36 ng/L — ABNORMAL HIGH (ref 0–19)

## 2024-07-09 LAB — PRO BRAIN NATRIURETIC PEPTIDE: Pro Brain Natriuretic Peptide: 1668 pg/mL — ABNORMAL HIGH

## 2024-07-09 MED ORDER — FUROSEMIDE 10 MG/ML IJ SOLN
40.0000 mg | Freq: Once | INTRAMUSCULAR | Status: AC
  Administered 2024-07-09: 40 mg via INTRAVENOUS
  Filled 2024-07-09: qty 4

## 2024-07-09 MED ORDER — IPRATROPIUM-ALBUTEROL 0.5-2.5 (3) MG/3ML IN SOLN
3.0000 mL | Freq: Once | RESPIRATORY_TRACT | Status: AC
  Administered 2024-07-09: 3 mL via RESPIRATORY_TRACT
  Filled 2024-07-09: qty 3

## 2024-07-09 MED ORDER — HYDROMORPHONE HCL 1 MG/ML IJ SOLN
0.5000 mg | Freq: Once | INTRAMUSCULAR | Status: DC
  Filled 2024-07-09: qty 1

## 2024-07-09 MED ORDER — DIPHENHYDRAMINE HCL 50 MG/ML IJ SOLN
25.0000 mg | Freq: Once | INTRAMUSCULAR | Status: DC
  Filled 2024-07-09: qty 1

## 2024-07-09 NOTE — ED Notes (Signed)
 Patient transported to X-ray

## 2024-07-09 NOTE — ED Provider Notes (Signed)
 " [place] [facility] AT [facility] Provider Note   CSN: [id number] Arrival date & time: 07/09/24  2038     Patient presents with: Shortness of Breath   Aaron Fuller is a 69 y.o. male patient with history of CHF, alcohol dependence, paroxysmal atrial flutter, COPD on 2 L normally, chronic respiratory failure who presents to the Emergency Department today for further evaluation of shortness of breath which started an hour ago.  Patient states that he was at his home when he became very short of breath causing him to get down to the floor and he is able to get up.  Patient states he has not taken his Lasix  in 2 weeks.  He states that this feels pretty typical for a CHF exacerbation.  He denies any chest pain, cough, congestion, fever, chills, nausea, vomit, diarrhea.    Shortness of Breath      Prior to Admission medications  Medication Sig Start Date End Date Taking? Authorizing Provider  albuterol  (VENTOLIN  HFA) 108 (90 Base) MCG/ACT inhaler Inhale 2 puffs into the lungs every 6 (six) hours as needed for wheezing or shortness of breath. 04/03/22   Kara Dorn NOVAK, MD  amLODipine  (NORVASC ) 10 MG tablet Take 1 tablet (10 mg total) by mouth daily. 01/06/24 04/05/24  Shlomo Wilbert SAUNDERS, MD  apixaban  (ELIQUIS ) 5 MG TABS tablet Take 1 tablet (5 mg total) by mouth 2 (two) times daily. 05/10/22 01/06/24  Swinyer, Rosaline HERO, NP  atorvastatin  (LIPITOR) 20 MG tablet Take 1 tablet (20 mg total) by mouth daily at 6 PM. 11/10/19   Turner, Wilbert SAUNDERS, MD  diphenhydrAMINE  (BENADRYL ) 25 mg capsule Take by mouth as needed for allergies.    [provider]  folic acid  (FOLVITE ) 1 MG tablet Take 1 tablet (1 mg total) by mouth daily. 10/04/19   Hongalgi, Anand D, MD  furosemide  (LASIX ) 20 MG tablet Take 1 tablet (20 mg total) by mouth 2 (two) times daily. 11/18/21   Jadine Toribio SQUIBB, MD  metoprolol  succinate (TOPROL -XL) 100 MG 24 hr tablet Take 100 mg by mouth at bedtime. 12/04/20    [provider]  metoprolol  succinate (TOPROL -XL) 25 MG 24 hr tablet Take 1 tablet (25 mg total) by mouth daily. 11/19/21   Jadine Toribio SQUIBB, MD  Multiple Vitamin (MULTIVITAMIN WITH MINERALS) TABS tablet Take 1 tablet by mouth daily. 10/04/19   Hongalgi, Anand D, MD  nicotine  (NICODERM CQ  - DOSED IN MG/24 HOURS) 14 mg/24hr patch Place 1 patch (14 mg total) onto the skin daily. 04/29/23   Lucien Orren SAILOR, PA-C  nitroGLYCERIN  (NITROSTAT ) 0.4 MG SL tablet Place 1 tablet (0.4 mg total) under the tongue every 5 (five) minutes as needed for chest pain. 04/29/23 01/06/24  Lucien Orren SAILOR, PA-C  OXYGEN Inhale 4 L into the lungs at bedtime.    [provider]  OXYGEN Inhale into the lungs.    [provider]  thiamine  100 MG tablet Take 1 tablet (100 mg total) by mouth daily. 10/04/19   Hongalgi, Anand D, MD  Tiotropium Bromide  Monohydrate (SPIRIVA  RESPIMAT) 2.5 MCG/ACT AERS INHALE 2 SPRAY(S) BY MOUTH ONCE DAILY 05/28/23   Conte, Tessa N, PA-C  umeclidinium-vilanterol (ANORO ELLIPTA ) 62.5-25 MCG/ACT AEPB Inhale 1 puff into the lungs daily. 04/29/23   Lucien Orren SAILOR, PA-C  vitamin B-12 (CYANOCOBALAMIN) 500 MCG tablet Take 500 mcg by mouth daily.    [provider]    Allergies: Patient has no active allergies.    Review  of Systems  Respiratory:  Positive for shortness of breath.   All other systems reviewed and are negative.   Updated Vital Signs BP (!) 166/90   Pulse 100   Temp 98.2 F (36.8 C) (Oral)   Resp 19   SpO2 96% Comment: 2L [place]  Physical Exam Vitals and nursing note reviewed.  Constitutional:      General: He is not in acute distress.    Appearance: Normal appearance.  HENT:     Head: Normocephalic and atraumatic.  Eyes:     General:        Right eye: No discharge.        Left eye: No discharge.  Cardiovascular:     Rate and Rhythm: Tachycardia present.     Comments: S1/S2 are distinct without any evidence of murmur, rubs, or gallops.  Radial  pulses are 2+ bilaterally.  Dorsalis pedis pulses are 2+ bilaterally.  No evidence of pedal edema. Pulmonary:     Effort: Tachypnea present.     Breath sounds: Decreased air movement present. Examination of the right-lower field reveals rhonchi and rales. Examination of the left-lower field reveals rhonchi and rales. Rhonchi and rales present.  Abdominal:     General: Abdomen is flat. Bowel sounds are normal. There is no distension.     Tenderness: There is no abdominal tenderness. There is no guarding or rebound.  Musculoskeletal:        General: Normal range of motion.     Cervical back: Neck supple.  Skin:    General: Skin is warm and dry.     Findings: No rash.  Neurological:     General: No focal deficit present.     Mental Status: He is alert.  Psychiatric:        Mood and Affect: Mood normal.        Behavior: Behavior normal.     (all labs ordered are listed, but only abnormal results are displayed) Labs Reviewed  BASIC METABOLIC PANEL WITH GFR - Abnormal; Notable for the following components:      Result Value   Sodium 133 (*)    Chloride 95 (*)    Glucose, Bld 115 (*)    Creatinine, Ser 1.44 (*)    GFR, Estimated 52 (*)    All other components within normal limits  CBC - Abnormal; Notable for the following components:   WBC 12.3 (*)    RDW 15.8 (*)    Platelets 144 (*)    All other components within normal limits  PRO BRAIN NATRIURETIC PEPTIDE - Abnormal; Notable for the following components:   Pro Brain Natriuretic Peptide 1,668.0 (*)    All other components within normal limits  TROPONIN T, HIGH SENSITIVITY - Abnormal; Notable for the following components:   Troponin T High Sensitivity 36 (*)    All other components within normal limits  RESP PANEL BY RT-PCR (RSV, FLU A&B, COVID)  [id number]  TROPONIN T, HIGH SENSITIVITY    EKG: None  Radiology: DG Chest 2 View Result Date: 07/09/2024 EXAM: 2 VIEW(S) XRAY OF THE CHEST 07/09/2024 09:06:05 PM COMPARISON: None  available. CLINICAL HISTORY: SOB FINDINGS: LUNGS AND PLEURA: No focal pulmonary opacity. No pleural effusion. No pneumothorax. HEART AND MEDIASTINUM: Left main pulmonary artery enlargement. Aortic atherosclerosis. BONES AND SOFT TISSUES: No acute osseous abnormality. IMPRESSION: 1. No acute findings. 2. Left main pulmonary artery enlargement. 3. Aortic atherosclerosis. Electronically signed by: Greig Pique MD 07/09/2024 09:20 PM EST RP Workstation: [id number]  Procedures   Medications Ordered in the ED  furosemide  (LASIX ) injection 40 mg (has no administration in time range)  ipratropium-albuterol  (DUONEB) 0.5-2.5 (3) MG/3ML nebulizer solution 3 mL (3 mLs Nebulization Given 07/09/24 2257)    Clinical Course as of 07/09/24 2341  Fri Jul 09, 2024  2209 Creatinine(!): 1.44 Similar to prior  [SG]  2339 Basic metabolic panel(!) Creatinine similar to baseline.  Mild hyponatremia and hypochloremia. [CF]  2340 CBC(!) Mild leukocytosis. [CF]  2340 Resp panel by RT-PCR (RSV, Flu A&B, Covid) Anterior Nasal Swab Negative. [CF]  2340 DG Chest 2 View No clear signs of pulmonary edema. I do agree with the radiologist interpretation.  [CF]    Clinical Course User Index [CF] Theotis Cameron HERO, PA-C [SG] Elnor Jayson LABOR, DO   Medical Decision Making Arnell Slivinski is a 69 y.o. male patient who presents to the Emergency Department today for further evaluation of shortness of breath.  Differential diagnosis does include pneumonia, CHF, ACS, viral syndrome.  Patient does have BNP of almost 1700.  Clinically he does not appear to be volume overloaded.  Does have some decreased breath sounds and some possible rales/rhonchi in the lower bases.  No evidence of pedal edema.  However, since patient has missed 2 weeks worth of Lasix  this could be the likely culprit.  I do not have any previous records to see if this is his baseline.  Will plan to start him on 40 of Lasix  IV.  Initial troponin is elevated as well.  I  suspect this is likely a chronic troponin leak.  Again, I do not have any thing to compare to.  Delta troponin is pending.  I do feel the patient would likely benefit from further evaluation in the hospital for acute exacerbation of CHF.  Vital signs have improved since arrival.  Patient is chronically on 2 L. Due to shift change, the rest of the patient's care will be transferred to oncoming provider where ultimate disposition will be made.    Amount and/or Complexity of Data Reviewed Labs: ordered. Decision-making details documented in ED Course. Radiology: ordered. Decision-making details documented in ED Course.  Risk Prescription drug management. Decision regarding hospitalization.     Final diagnoses:  None    ED Discharge Orders     None          Theotis Cameron HERO, [place] 07/11/24 1912  "

## 2024-07-09 NOTE — ED Provider Notes (Signed)
 Patient with history of CHF, has been out of his Lasix  for 2 weeks.  Very tachypneic and short of breath with exertion.  Elevated BNP, mild troponin leak.  Consulted hospitalist, Dr. Tobie who is agreeable to admission   Aaron Fuller 07/10/24 0023    Aaron Lavonia LOISE, MD 07/10/24 [phone]

## 2024-07-09 NOTE — ED Triage Notes (Signed)
 Pt BIB EMS from home for SOB x1 hour. Hx of CHF but does not take his prescribed medication per EMS. Pt prescribed 2L [place] at home but does not use it per EMS. EMS placed pt on non rebreather en route.   EMS Vitals  BP 126/82 HR 126 RR 20 CBG 110

## 2024-07-09 NOTE — ED Provider Notes (Incomplete)
 Patient with history of CHF, has been out of his Lasix  for 2 weeks.  Very tachypneic and short of breath with exertion.  Elevated BNP, mild troponin leak.  Consulted hospitalist, Dr.

## 2024-07-10 DIAGNOSIS — J441 Chronic obstructive pulmonary disease with (acute) exacerbation: Secondary | ICD-10-CM | POA: Diagnosis not present

## 2024-07-10 LAB — BASIC METABOLIC PANEL WITH GFR
Anion gap: 8 (ref 5–15)
BUN: 17 mg/dL (ref 8–23)
CO2: 33 mmol/L — ABNORMAL HIGH (ref 22–32)
Calcium: 9.2 mg/dL (ref 8.9–10.3)
Chloride: 93 mmol/L — ABNORMAL LOW (ref 98–111)
Creatinine, Ser: 1.47 mg/dL — ABNORMAL HIGH (ref 0.61–1.24)
GFR, Estimated: 51 mL/min — ABNORMAL LOW
Glucose, Bld: 99 mg/dL (ref 70–99)
Potassium: 4.4 mmol/L (ref 3.5–5.1)
Sodium: 133 mmol/L — ABNORMAL LOW (ref 135–145)

## 2024-07-10 LAB — CBC
HCT: 40.2 % (ref 39.0–52.0)
Hemoglobin: 13.7 g/dL (ref 13.0–17.0)
MCH: 31.5 pg (ref 26.0–34.0)
MCHC: 34.1 g/dL (ref 30.0–36.0)
MCV: 92.4 fL (ref 80.0–100.0)
Platelets: 157 K/uL (ref 150–400)
RBC: 4.35 MIL/uL (ref 4.22–5.81)
RDW: 15.7 % — ABNORMAL HIGH (ref 11.5–15.5)
WBC: 13 K/uL — ABNORMAL HIGH (ref 4.0–10.5)
nRBC: 0 % (ref 0.0–0.2)

## 2024-07-10 LAB — HIV ANTIBODY (ROUTINE TESTING W REFLEX): HIV Screen 4th Generation wRfx: NONREACTIVE

## 2024-07-10 MED ORDER — METOPROLOL SUCCINATE ER 25 MG PO TB24: 25.0000 mg | ORAL_TABLET | Freq: Every day | ORAL | Status: DC

## 2024-07-10 MED ORDER — THIAMINE HCL 100 MG/ML IJ SOLN
100.0000 mg | Freq: Every day | INTRAMUSCULAR | Status: DC
  Administered 2024-07-10: 100 mg via INTRAVENOUS
  Filled 2024-07-10 (×2): qty 2

## 2024-07-10 MED ORDER — ATORVASTATIN CALCIUM 10 MG PO TABS
20.0000 mg | ORAL_TABLET | Freq: Every day | ORAL | Status: DC
  Administered 2024-07-10: 20 mg via ORAL
  Filled 2024-07-10: qty 2

## 2024-07-10 MED ORDER — SODIUM CHLORIDE 0.9% FLUSH
3.0000 mL | Freq: Two times a day (BID) | INTRAVENOUS | Status: DC
  Administered 2024-07-10 – 2024-07-11 (×4): 3 mL via INTRAVENOUS

## 2024-07-10 MED ORDER — ACETAMINOPHEN 325 MG PO TABS: 650.0000 mg | ORAL_TABLET | Freq: Four times a day (QID) | ORAL | Status: DC | PRN

## 2024-07-10 MED ORDER — ADULT MULTIVITAMIN W/MINERALS CH
1.0000 | ORAL_TABLET | Freq: Every day | ORAL | Status: DC
  Administered 2024-07-10 – 2024-07-11 (×2): 1 via ORAL
  Filled 2024-07-10 (×2): qty 1

## 2024-07-10 MED ORDER — METOPROLOL SUCCINATE ER 100 MG PO TB24
100.0000 mg | ORAL_TABLET | Freq: Every day | ORAL | Status: DC
  Administered 2024-07-10 – 2024-07-11 (×2): 100 mg via ORAL
  Filled 2024-07-10 (×2): qty 1

## 2024-07-10 MED ORDER — BISACODYL 5 MG PO TBEC: 5.0000 mg | DELAYED_RELEASE_TABLET | Freq: Every day | ORAL | Status: DC | PRN

## 2024-07-10 MED ORDER — IPRATROPIUM-ALBUTEROL 0.5-2.5 (3) MG/3ML IN SOLN: 3.0000 mL | Freq: Four times a day (QID) | RESPIRATORY_TRACT | Status: DC | PRN

## 2024-07-10 MED ORDER — NICOTINE 21 MG/24HR TD PT24
21.0000 mg | MEDICATED_PATCH | Freq: Every day | TRANSDERMAL | Status: DC
  Administered 2024-07-10 – 2024-07-11 (×3): 21 mg via TRANSDERMAL
  Filled 2024-07-10 (×3): qty 1

## 2024-07-10 MED ORDER — FUROSEMIDE 20 MG PO TABS
20.0000 mg | ORAL_TABLET | Freq: Two times a day (BID) | ORAL | Status: DC
  Administered 2024-07-10 – 2024-07-11 (×3): 20 mg via ORAL
  Filled 2024-07-10 (×3): qty 1

## 2024-07-10 MED ORDER — ONDANSETRON HCL 4 MG/2ML IJ SOLN: 4.0000 mg | Freq: Four times a day (QID) | INTRAMUSCULAR | Status: DC | PRN

## 2024-07-10 MED ORDER — SENNOSIDES-DOCUSATE SODIUM 8.6-50 MG PO TABS: 1.0000 | ORAL_TABLET | Freq: Every evening | ORAL | Status: DC | PRN

## 2024-07-10 MED ORDER — LORAZEPAM 2 MG/ML IJ SOLN: 1.0000 mg | INTRAMUSCULAR | Status: DC | PRN

## 2024-07-10 MED ORDER — FOLIC ACID 1 MG PO TABS
1.0000 mg | ORAL_TABLET | Freq: Every day | ORAL | Status: DC
  Administered 2024-07-10 – 2024-07-11 (×2): 1 mg via ORAL
  Filled 2024-07-10 (×2): qty 1

## 2024-07-10 MED ORDER — BUDESONIDE 0.25 MG/2ML IN SUSP
0.2500 mg | Freq: Two times a day (BID) | RESPIRATORY_TRACT | Status: DC
  Administered 2024-07-10 – 2024-07-11 (×4): 0.25 mg via RESPIRATORY_TRACT
  Filled 2024-07-10 (×4): qty 2

## 2024-07-10 MED ORDER — ACETAMINOPHEN 650 MG RE SUPP: 650.0000 mg | Freq: Four times a day (QID) | RECTAL | Status: DC | PRN

## 2024-07-10 MED ORDER — AMLODIPINE BESYLATE 5 MG PO TABS
10.0000 mg | ORAL_TABLET | Freq: Every day | ORAL | Status: DC
  Administered 2024-07-10 – 2024-07-11 (×2): 10 mg via ORAL
  Filled 2024-07-10 (×2): qty 2

## 2024-07-10 MED ORDER — THIAMINE MONONITRATE 100 MG PO TABS
100.0000 mg | ORAL_TABLET | Freq: Every day | ORAL | Status: DC
  Administered 2024-07-11: 100 mg via ORAL
  Filled 2024-07-10 (×2): qty 1

## 2024-07-10 MED ORDER — PREDNISONE 20 MG PO TABS
40.0000 mg | ORAL_TABLET | Freq: Every day | ORAL | Status: DC
  Administered 2024-07-10 – 2024-07-11 (×3): 40 mg via ORAL
  Filled 2024-07-10 (×3): qty 2

## 2024-07-10 MED ORDER — LORAZEPAM 1 MG PO TABS: 1.0000 mg | ORAL_TABLET | ORAL | Status: DC | PRN

## 2024-07-10 MED ORDER — ARFORMOTEROL TARTRATE 15 MCG/2ML IN NEBU
15.0000 ug | INHALATION_SOLUTION | Freq: Two times a day (BID) | RESPIRATORY_TRACT | Status: DC
  Administered 2024-07-10 – 2024-07-11 (×4): 15 ug via RESPIRATORY_TRACT
  Filled 2024-07-10 (×4): qty 2

## 2024-07-10 MED ORDER — ONDANSETRON HCL 4 MG PO TABS: 4.0000 mg | ORAL_TABLET | Freq: Four times a day (QID) | ORAL | Status: DC | PRN

## 2024-07-10 MED ORDER — APIXABAN 5 MG PO TABS
5.0000 mg | ORAL_TABLET | Freq: Two times a day (BID) | ORAL | Status: DC
  Administered 2024-07-10 – 2024-07-11 (×3): 5 mg via ORAL
  Filled 2024-07-10 (×3): qty 1

## 2024-07-10 NOTE — ED Notes (Signed)
 Pt transitioned onto hospital bed for comfort. New gown provided. Pt declined to remove his pants at this time. Call light within reach. Pt remains connected to monitor.

## 2024-07-10 NOTE — Hospital Course (Signed)
 Aaron Fuller is a 69 y.o. male with medical history significant for severe COPD, chronic respiratory failure with hypoxia (supposed to use 2 L O2 via [place] but not adherent), chronic HFpEF, paroxysmal atrial flutter on Eliquis , pulmonary HTN, CKD stage IIIa, HTN, HLD, tobacco and alcohol use who is admitted with acute COPD exacerbation.

## 2024-07-10 NOTE — Progress Notes (Signed)
 Aaron Fuller is a 69 y.o. male with medical history significant for severe COPD, chronic respiratory failure with hypoxia (supposed to use 2 L O2 via [place] but not adherent), chronic HFmrEF, paroxysmal atrial flutter on Eliquis , pulmonary HTN, CKD stage IIIa, HTN, HLD, tobacco and alcohol use who presented to the ED for evaluation of shortness of breath and eventually was admitted under hospitalist service after midnight with a diagnosis of acute COPD exacerbation.  He was requiring 2 L of oxygen which is his baseline.  Started on steroids, bronchodilators.  Patient seen and examined in the ED this morning.  He was telling me that he was feeling well and would prefer to go home.  Although clinically he did appear to be having some dyspnea while talking to me.  Also he was having atrial fibrillation with rates around 120 while he was laying in the bed.  On examination, he had diminished breath sounds, no wheezes on the lungs.  He did not appear to be stable enough for discharge yet.  I realized that he was resumed on Toprol -XL 25 mg whereas his home dose was 100 mg.  I resumed home dose of Toprol -XL.  Continue Eliquis , bronchodilators and prednisone .  Has a history of combined systolic and diastolic congestive heart failure and takes p.o. Lasix  at home.  Did not appear to be fluid overloaded, agree with admitting hospitalist.  Continue p.o. Lasix .  Total time spent 40 minutes.

## 2024-07-10 NOTE — ED Notes (Signed)
 Pt given utensils for dinner: fork, knife, spoon.

## 2024-07-10 NOTE — H&P (Signed)
 " History and Physical    Aaron Fuller FMW:[id number] DOB: 11/07/1955 DOA: 07/09/2024  PCP: Lesa Jon HERO, PA  Patient coming from: Home  I have personally briefly reviewed patient's old medical records in [facility]  Chief Complaint: Shortness of breath  HPI: Aaron Fuller is a 69 y.o. male with medical history significant for severe COPD, chronic respiratory failure with hypoxia (supposed to use 2 L O2 via [place] but not adherent), chronic HFmrEF, paroxysmal atrial flutter on Eliquis , pulmonary HTN, CKD stage IIIa, HTN, HLD, tobacco and alcohol use who presented to the ED for evaluation of shortness of breath.  Patient reports progressive and worsening shortness of breath from baseline.  He is supposed to use 2 L supplemental O2 via [place] at baseline.  He he reported to EMS and ED provider that he had not been using it but tells me that he does.  He also reported that he was out of his Lasix  for the last 2 weeks but tells me that he has been taking it.  Patient had worsening dyspnea this evening which persisted for about 1 hour.  He called EMS and was placed on nonrebreather en route to the ED.  He denies associated fevers, chills, diaphoresis, cough, chest pain, nausea, vomit, abdominal pain, lower extremity edema.  He reports good urine output without dysuria.  Patient reports he is smoking 1 pack/day.  He also reports drinking about one half of 1/5th of vodka daily with last drink about 2 days ago.  He denies any history of withdrawal.  ED Course  Labs/Imaging on admission: I have personally reviewed following labs and imaging studies.  Initial vitals showed BP 117/68, pulse 121, RR 30, temp 98.2 F, SpO2 96% on 2 L supplemental O2 via .  Labs showed troponin T 36 x 2, proBNP 1668, WBC 12.3, hemoglobin 14.0, platelets 144, sodium 133, potassium 3.9, bicarb 25, BUN 15, creatinine 1.44, serum glucose 115.  SARS-CoV-2, influenza, RSV PCR negative.  2 view chest x-ray negative for  acute findings.  Left main pulmonary artery enlargement and aortic atherosclerosis noted.  Patient was given IV Lasix  40 mg and DuoNeb treatment.  The hospitalist service was consulted for admission.  Review of Systems: All systems reviewed and are negative except as documented in history of present illness above.   Past Medical History:  Diagnosis Date   Alcohol dependence (HCC)    Chronic diastolic CHF (congestive heart failure) (HCC)    normal coronary arteries on cath   COPD (chronic obstructive pulmonary disease) (HCC)    Hypertension    Paroxysmal atrial flutter (HCC)    Pulmonary HTN (HCC)    secondary to COPD   Tobacco dependence     Past Surgical History:  Procedure Laterality Date   RIGHT HEART CATH N/A 11/16/2021   Procedure: RIGHT HEART CATH;  Surgeon: Claudene Victory ORN, MD;  Location: [facility] INVASIVE CV LAB;  Service: Cardiovascular;  Laterality: N/A;   RIGHT/LEFT HEART CATH AND CORONARY ANGIOGRAPHY Bilateral 09/29/2019   Procedure: RIGHT/LEFT HEART CATH AND CORONARY ANGIOGRAPHY;  Surgeon: Burnard Debby LABOR, MD;  Location: [facility] INVASIVE CV LAB;  Service: Cardiovascular;  Laterality: Bilateral;   ulcer surgery      Social History: Patient reports he is smoking 1 pack/day.  He also reports drinking about one half of 1/5th of vodka daily with last drink about 2 days ago.  He denies any history of withdrawal.  Allergies[1]  Family History  Problem Relation Age of Onset   CAD  Mother    Diabetes Mother    Heart failure Mother    CAD Father    Diabetes Sister      Prior to Admission medications  Medication Sig Start Date End Date Taking? Authorizing Provider  albuterol  (VENTOLIN  HFA) 108 (90 Base) MCG/ACT inhaler Inhale 2 puffs into the lungs every 6 (six) hours as needed for wheezing or shortness of breath. 04/03/22   Kara Dorn NOVAK, MD  amLODipine  (NORVASC ) 10 MG tablet Take 1 tablet (10 mg total) by mouth daily. 01/06/24 04/05/24  Shlomo Wilbert SAUNDERS, MD  apixaban  (ELIQUIS )  5 MG TABS tablet Take 1 tablet (5 mg total) by mouth 2 (two) times daily. 05/10/22 01/06/24  Swinyer, Rosaline HERO, NP  atorvastatin  (LIPITOR) 20 MG tablet Take 1 tablet (20 mg total) by mouth daily at 6 PM. 11/10/19   Turner, Wilbert SAUNDERS, MD  diphenhydrAMINE  (BENADRYL ) 25 mg capsule Take by mouth as needed for allergies.    [provider]  folic acid  (FOLVITE ) 1 MG tablet Take 1 tablet (1 mg total) by mouth daily. 10/04/19   Hongalgi, Anand D, MD  furosemide  (LASIX ) 20 MG tablet Take 1 tablet (20 mg total) by mouth 2 (two) times daily. 11/18/21   Jadine Toribio SQUIBB, MD  metoprolol  succinate (TOPROL -XL) 100 MG 24 hr tablet Take 100 mg by mouth at bedtime. 12/04/20   [provider]  metoprolol  succinate (TOPROL -XL) 25 MG 24 hr tablet Take 1 tablet (25 mg total) by mouth daily. 11/19/21   Jadine Toribio SQUIBB, MD  Multiple Vitamin (MULTIVITAMIN WITH MINERALS) TABS tablet Take 1 tablet by mouth daily. 10/04/19   Hongalgi, Anand D, MD  nicotine  (NICODERM CQ  - DOSED IN MG/24 HOURS) 14 mg/24hr patch Place 1 patch (14 mg total) onto the skin daily. 04/29/23   Lucien Orren SAILOR, PA-C  nitroGLYCERIN  (NITROSTAT ) 0.4 MG SL tablet Place 1 tablet (0.4 mg total) under the tongue every 5 (five) minutes as needed for chest pain. 04/29/23 01/06/24  Lucien Orren SAILOR, PA-C  OXYGEN Inhale 4 L into the lungs at bedtime.    [provider]  OXYGEN Inhale into the lungs.    [provider]  thiamine  100 MG tablet Take 1 tablet (100 mg total) by mouth daily. 10/04/19   Hongalgi, Anand D, MD  Tiotropium Bromide  Monohydrate (SPIRIVA  RESPIMAT) 2.5 MCG/ACT AERS INHALE 2 SPRAY(S) BY MOUTH ONCE DAILY 05/28/23   Conte, Tessa N, PA-C  umeclidinium-vilanterol (ANORO ELLIPTA ) 62.5-25 MCG/ACT AEPB Inhale 1 puff into the lungs daily. 04/29/23   Lucien Orren SAILOR, PA-C  vitamin B-12 (CYANOCOBALAMIN) 500 MCG tablet Take 500 mcg by mouth daily.    [provider]    Physical Exam: Vitals:   07/09/24 2230 07/09/24  2315 07/09/24 2324 07/10/24 0000  BP: (!) 142/90 (!) 166/90  121/68  Pulse: 100 100  100  Resp: (!) 22 19  (!) 25  Temp:      TempSrc:      SpO2: 97% 97% 96% 96%   Constitutional: Resting in bed, NAD, calm, comfortable Eyes: EOMI, lids and conjunctivae normal ENMT: Mucous membranes are moist. Posterior pharynx clear of any exudate or lesions.Normal dentition.  Neck: normal, supple, no masses. Respiratory: Distant breath sounds.  Slightly increased respiratory effort while on 2 L O2 via [place]. No accessory muscle use.  Cardiovascular: Tachycardic. No extremity edema. 2+ pedal pulses. Abdomen: no tenderness, no masses palpated. Musculoskeletal: no clubbing / cyanosis. No joint deformity upper and lower extremities. Good ROM, no contractures.  Normal muscle tone.  Skin: no rashes, lesions, ulcers. No induration Neurologic: Sensation intact. Strength 5/5 in all 4.  Psychiatric: Normal judgment and insight. Alert and oriented x 3. Normal mood.   EKG: Personally reviewed.  Sinus tachycardia, rate 127. Rate is faster when compared previous.  Assessment/Plan Principal Problem:   COPD with acute exacerbation (HCC) Active Problems:   Chronic respiratory failure with hypoxia (HCC)   Chronic heart failure with mildly reduced ejection fraction (HFmrEF, 41-49%) (HCC)   Alcohol use disorder   Paroxysmal atrial flutter (HCC)   Pulmonary hypertension (HCC)   Tobacco dependence   CKD stage IIIa   Essential hypertension   Aaron Fuller is a 69 y.o. male with medical history significant for severe COPD, chronic respiratory failure with hypoxia (supposed to use 2 L O2 via [place] but not adherent), chronic HFpEF, paroxysmal atrial flutter on Eliquis , pulmonary HTN, CKD stage IIIa, HTN, HLD, tobacco and alcohol use who is admitted with acute COPD exacerbation.  Assessment and Plan: COPD with acute exacerbation Chronic respiratory failure with hypoxia: Seems to be secondary to nonadherence and ongoing tobacco  use.  SpO2 is stable on home 2 L O2 via [place]. - Brovana /Pulmicort  twice daily - DuoNebs as needed - Prednisone  40 mg daily - Continue 2 L of home O2 [place]  Chronic HFmrEF/Pulmonary HTN: Received IV Lasix  40 mg in the ED.  Does not appear significantly volume overloaded.  TTE 11/13/2021 showed EF 40-45%, LV global hypokinesis, G2DD, moderately reduced RV systolic function, moderately elevated PA systolic pressure. - Hold further IV diuresis - Resume home oral Lasix  20 mg twice daily in the morning - Resume Toprol -XL 25 mg daily  Paroxysmal atrial flutter: Sinus tachycardia on admission.  Resume Toprol -XL and Eliquis .  CKD stage IIIa: Renal function stable, continue to monitor.  Hypertension: Resume Toprol -XL and amlodipine .  Hyperlipidemia: Continue atorvastatin .  Alcohol use: Patient reports drinking half of 1/5th of vodka daily, last drink about 2 days prior to admission.  Will place on CIWA protocol with Ativan  as needed.  Continue thiamine , folate, MVM.  Tobacco use: Patient reports smoking 1 pack/day.  Nicotine  patch provided.   DVT prophylaxis:  apixaban  (ELIQUIS ) tablet 5 mg   Code Status: Full code, confirmed with patient on admission Family Communication: Discussed with patient, he has discussed with family Disposition Plan: From home and likely discharge to home pending clinical progress Consults called: None Severity of Illness: The appropriate patient status for this patient is OBSERVATION. Observation status is judged to be reasonable and necessary in order to provide the required intensity of service to ensure the patient's safety. The patient's presenting symptoms, physical exam findings, and initial radiographic and laboratory data in the context of their medical condition is felt to place them at decreased risk for further clinical deterioration. Furthermore, it is anticipated that the patient will be medically stable for discharge from the hospital within 2 midnights  of admission.   Jorie Blanch MD [facility]  If 7PM-7AM, please contact night-coverage [email or web address]  07/10/2024, 12:47 AM      [1] No Active Allergies  "

## 2024-07-11 ENCOUNTER — Other Ambulatory Visit (HOSPITAL_COMMUNITY): Payer: Self-pay

## 2024-07-11 DIAGNOSIS — J441 Chronic obstructive pulmonary disease with (acute) exacerbation: Secondary | ICD-10-CM | POA: Diagnosis not present

## 2024-07-11 MED ORDER — DOXYCYCLINE HYCLATE 100 MG PO TABS
100.0000 mg | ORAL_TABLET | Freq: Two times a day (BID) | ORAL | 0 refills | Status: AC
  Filled 2024-07-11: qty 10, 5d supply, fill #0

## 2024-07-11 MED ORDER — PREDNISONE 20 MG PO TABS
40.0000 mg | ORAL_TABLET | Freq: Every day | ORAL | 0 refills | Status: AC
  Filled 2024-07-11: qty 6, 3d supply, fill #0

## 2024-07-11 NOTE — Progress Notes (Signed)
 OT Cancellation Note  Patient Details Name: Rameen Quinney MRN: [id number] DOB: 1956-04-26   Cancelled Treatment:    Reason Eval/Treat Not Completed: OT screened, no needs identified, will sign off (No acute OT needs, will sign off. Thank you.)  Tryce Surratt D Causey 07/11/2024, 11:01 AM

## 2024-07-11 NOTE — Evaluation (Signed)
 Physical Therapy Evaluation & Discharge Patient Details Name: Aaron Fuller MRN: [id number] DOB: 06-01-1956 Today's Date: 07/11/2024  History of Present Illness  69 y.o. male admitted 07/10/23 with SOB, workup for acute COPD exacerbation. PMH includes severe COPD (wears 2L O2 baseline), HF, aflutter on Eliquis , pulmonary HTN, CKD, HTN, HLD, tobacco abuse, Hep C.  Clinical Impression  Patient evaluated by Physical Therapy with no further acute PT needs identified. PTA, pt indep, lives with significant other, primarily sedentary and stays at home; wears 2L O2 [place] baseline. Today, pt able to transfer and ambulate with intermittent CGA for balance; pt reports plan to use SPC at home, also reports he may quit smoking. Pt preparing for d/c home today. All education has been completed and the patient has no further questions; pt declines recommendation for follow up HHPT services. Acute PT is signing off. Thank you for this referral.    SpO2 88-98% on 2L O2 [place], difficulty getting reliable pleth     If plan is discharge home, recommend the following: Assistance with cooking/housework;Assist for transportation   Can travel by private vehicle    Yes    Equipment Recommendations None recommended by PT  Recommendations for Other Services       Functional Status Assessment Patient has had a recent decline in their functional status and demonstrates the ability to make significant improvements in function in a reasonable and predictable amount of time.     Precautions / Restrictions Precautions Precautions: Fall;Other (comment) Recall of Precautions/Restrictions: Intact Precaution/Restrictions Comments: wears 2L O2 baseline Restrictions Weight Bearing Restrictions Per Provider Order: No      Mobility  Bed Mobility               General bed mobility comments: received sitting EOB    Transfers Overall transfer level: Needs assistance Equipment used: None Transfers: Sit to/from Stand Sit  to Stand: Supervision                Ambulation/Gait Ambulation/Gait assistance: Contact guard assist Gait Distance (Feet): 40 Feet Assistive device: None Gait Pattern/deviations: Step-through pattern, Decreased stride length Gait velocity: Decreased     General Gait Details: slow, mildly unsteady gait without DME, pt intermittently reaching to bed rail/furniture for UE support, 1x self-corrected posterior LOB; CGA for balance and assist to push O2 tank; pt declines hallway ambulation therefore walking laps in ED room; stability improving with distance  Stairs            Wheelchair Mobility     Tilt Bed    Modified Rankin (Stroke Patients Only)       Balance Overall balance assessment: Needs assistance   Sitting balance-Leahy Scale: Good     Standing balance support: No upper extremity supported, During functional activity Standing balance-Leahy Scale: Fair                               Pertinent Vitals/Pain Pain Assessment Pain Assessment: No/denies pain    Home Living Family/patient expects to be discharged to:: Private residence Living Arrangements: Spouse/significant other Available Help at Discharge: Friend(s);Available PRN/intermittently Type of Home: House Home Access: Level entry       Home Layout: One level Home Equipment: Cane - single point;Rolling Walker (2 wheels) Additional Comments: 2L O2 baseline. reports GF living with him who works    Prior Function Prior Level of Function : Independent/Modified Independent  Mobility Comments: indep without DME, reports he plans to start using a cane. primarily sedentary and stays at home. does not drive. retired investment banker, operational for [employer] ADLs Comments: reports indep ADLs; GF assists with cooking and household tasks     Extremity/Trunk Assessment   Upper Extremity Assessment Upper Extremity Assessment: Generalized weakness    Lower Extremity Assessment Lower  Extremity Assessment: Generalized weakness    Cervical / Trunk Assessment Cervical / Trunk Assessment: Kyphotic  Communication   Communication Communication: No apparent difficulties    Cognition Arousal: Alert Behavior During Therapy: WFL for tasks assessed/performed   PT - Cognitive impairments: No apparent impairments                       PT - Cognition Comments: WFL for simple tasks, not formally assessed. suspect overall decreased awareness/insight into current condition Following commands: Intact       Cueing       General Comments General comments (skin integrity, edema, etc.): SpO2 88-98% on 2L O2 [place] with activity. increased time discussing fall risk reduction at home, pt reports plan to use one of his canes. pt also reports since not smoking the past couple days, he may try to quit. pt declines desire for any follow up PT/OT services, declines need for acute OT eval prior to d/c today. further educ re: activity recommendations, importance of mobility, pulmonary hygiene, energy conservation strategies (including use of shower chair)    Exercises     Assessment/Plan    PT Assessment Patient does not need any further PT services  PT Problem List         PT Treatment Interventions      PT Goals (Current goals can be found in the Care Plan section)  Acute Rehab PT Goals Patient Stated Goal: home today PT Goal Formulation: All assessment and education complete, DC therapy    Frequency       Co-evaluation               AM-PAC PT 6 Clicks Mobility  Outcome Measure Help needed turning from your back to your side while in a flat bed without using bedrails?: None Help needed moving from lying on your back to sitting on the side of a flat bed without using bedrails?: None Help needed moving to and from a bed to a chair (including a wheelchair)?: A Little Help needed standing up from a chair using your arms (e.g., wheelchair or bedside chair)?:  None Help needed to walk in hospital room?: A Little Help needed climbing 3-5 steps with a railing? : A Little 6 Click Score: 21    End of Session Equipment Utilized During Treatment: Oxygen Activity Tolerance: Patient tolerated treatment well Patient left: in bed;with call bell/phone within reach;with bed alarm set Nurse Communication: Mobility status PT Visit Diagnosis: Other abnormalities of gait and mobility (R26.89)    Time: 8958-8943 PT Time Calculation (min) (ACUTE ONLY): 15 min   Charges:   PT Evaluation $PT Eval Moderate Complexity: 1 Mod   PT General Charges $$ ACUTE PT VISIT: 1 Visit       Darice Almas, PT, DPT Acute Rehabilitation Services  Personal: Secure Chat Rehab Office: [phone]  Darice LITTIE Almas 07/11/2024, 12:01 PM

## 2024-07-11 NOTE — ED Notes (Signed)
 Patient evaluated by PT who agrees with discharge for patient and is not recommending OT. Patient reports he has a ride home but no oxygen tank that someone can bring for him to get home. Spoke with case management who is arranging for portable tank for patient to get home with.

## 2024-07-11 NOTE — ED Notes (Signed)
 Patient disconnected himself from cardiac monitor, attempted to get out of bed to walk to restroom. Patient reports he is able to walk, however, he was incredibly unsteady. Bedside commode utilized.

## 2024-07-11 NOTE — Care Management Obs Status (Signed)
 MEDICARE OBSERVATION STATUS NOTIFICATION   Patient Details  Name: Aaron Fuller MRN: [id number] Date of Birth: 09/30/1955   Medicare Observation Status Notification Given:  Yes    Corean JAYSON Canary, RN 07/11/2024, 8:36 AM

## 2024-07-11 NOTE — Discharge Summary (Signed)
 Physician Discharge Summary  Aaron Fuller FMW:[id number] DOB: [birth year] DOA: 07/09/2024  PCP: Lesa Jon HERO, PA  Admit date: 07/09/2024 Discharge date: 07/11/2024    Admitted From: Home Disposition: Home  Recommendations for Outpatient Follow-up:  Follow up with PCP in 1-2 weeks Please obtain BMP/CBC in one week Please follow up with your PCP on the following pending results: Unresulted Labs (From admission, onward)    None         Home Health: None Equipment/Devices: None  Discharge Condition: Stable CODE STATUS: Full code Diet recommendation:  Diet Order             Diet Heart Room service appropriate? Yes; Fluid consistency: Thin  Diet effective now                   Subjective: Seen and examined, doing well.  Wants to go home.  Denies any shortness of breath.  He tells me that he uses oxygen all the time at home.  Brief/Interim Summary: Aaron Fuller is a 69 y.o. male with medical history significant for severe COPD, chronic respiratory failure with hypoxia (supposed to use 2 L O2 via [place] but not adherent reportedly), chronic HFmrEF and diastolic congestive heart failure,, paroxysmal atrial flutter on Eliquis , pulmonary HTN, CKD stage IIIa, HTN, HLD, tobacco and alcohol use who presented to the ED for evaluation of shortness of breath which was ongoing for 2 days. He also reported to the ED physician that he was out of his Lasix  for the last 2 weeks but tells me that he has been taking it. He called EMS and was placed on nonrebreather en route to the ED.  He denied associated fevers, chills, diaphoresis, cough, chest pain, nausea, vomit, abdominal pain, lower extremity edema.  He reports good urine output without dysuria. Patient reports he is smoking 1 pack/day.  He also reports drinking about one half of 1/5th of vodka daily with last drink about 2 days ago.  He denies any history of withdrawal. Upon arrival to ED, Labs showed troponin T 36 x 2, proBNP 1668, WBC  12.3, hemoglobin 14.0, platelets 144, sodium 133, potassium 3.9, bicarb 25, BUN 15, creatinine 1.44, serum glucose 115. SARS-CoV-2, influenza, RSV PCR negative. 2 view chest x-ray negative for acute findings.  Left main pulmonary artery enlargement and aortic atherosclerosis noted. Patient was given IV Lasix  40 mg and DuoNeb treatment.  The hospitalist service was consulted for admission.  Patient was admitted under hospitalist service mainly for COPD exacerbation and chronic hypoxic respiratory failure.  Patient did not require any more than 2 L of oxygen which is his baseline.  He was started on prednisone , bronchodilators.  Patient did not appear to be in acute exacerbation of CHF, although his proBNP was elevated but in the past only BNP as charted and this appears to be at his baseline.  Patient has no edema and no crackles on exam and chest x-ray did not show pulmonary edema.  We resumed his Lasix  as well.  His atrial fibrillation remained under control with resumption of the Toprol -XL and Eliquis .  Renal function remained at baseline, he has CKD stage IIIa.  Patient is feeling much better today.  No wheezes.  He is being discharged home and will continue 3 more days of prednisone  and 5 days of doxycycline .  Resuming all other medications.  He tried calling his wife when I was in his room so I can update her but she did not pick up the call.  He said he has already spoken to his wife this morning and he will call her later.  Discharge Diagnoses:  Principal Problem:   COPD with acute exacerbation (HCC) Active Problems:   Chronic respiratory failure with hypoxia (HCC)   Chronic heart failure with mildly reduced ejection fraction (HFmrEF, 41-49%) (HCC)   Alcohol use disorder   Paroxysmal atrial flutter (HCC)   Pulmonary hypertension (HCC)   Tobacco dependence   CKD stage IIIa   Essential hypertension    Discharge Instructions   Allergies as of 07/11/2024   No Known Allergies       Medication List     TAKE these medications    albuterol  108 (90 Base) MCG/ACT inhaler Commonly known as: VENTOLIN  HFA Inhale 2 puffs into the lungs every 6 (six) hours as needed for wheezing or shortness of breath.   amLODipine  10 MG tablet Commonly known as: NORVASC  Take 1 tablet (10 mg total) by mouth daily.   apixaban  5 MG Tabs tablet Commonly known as: ELIQUIS  Take 1 tablet (5 mg total) by mouth 2 (two) times daily.   atorvastatin  20 MG tablet Commonly known as: LIPITOR Take 1 tablet (20 mg total) by mouth daily at 6 PM.   diphenhydrAMINE  25 mg capsule Commonly known as: BENADRYL  Take 25 mg by mouth as needed for allergies.   doxycycline  100 MG tablet Commonly known as: VIBRA -TABS Take 1 tablet (100 mg total) by mouth 2 (two) times daily for 5 days.   folic acid  1 MG tablet Commonly known as: FOLVITE  Take 1 tablet (1 mg total) by mouth daily.   furosemide  20 MG tablet Commonly known as: LASIX  Take 1 tablet (20 mg total) by mouth 2 (two) times daily.   metoprolol  succinate 100 MG 24 hr tablet Commonly known as: TOPROL -XL Take 100 mg by mouth at bedtime. What changed: Another medication with the same name was removed. Continue taking this medication, and follow the directions you see here.   multivitamin with minerals Tabs tablet Take 1 tablet by mouth daily.   nicotine  14 mg/24hr patch Commonly known as: NICODERM CQ  - dosed in mg/24 hours Place 1 patch (14 mg total) onto the skin daily.   nitroGLYCERIN  0.4 MG SL tablet Commonly known as: NITROSTAT  Place 1 tablet (0.4 mg total) under the tongue every 5 (five) minutes as needed for chest pain.   OXYGEN Inhale 3 L into the lungs in the morning and at bedtime.   predniSONE  20 MG tablet Commonly known as: DELTASONE  Take 2 tablets (40 mg total) by mouth daily for 3 days.   Spiriva  Respimat 2.5 MCG/ACT Aers Generic drug: Tiotropium Bromide  INHALE 2 SPRAY(S) BY MOUTH ONCE DAILY   thiamine  100 MG  tablet Commonly known as: VITAMIN B1 Take 1 tablet (100 mg total) by mouth daily.   umeclidinium-vilanterol 62.5-25 MCG/ACT Aepb Commonly known as: ANORO ELLIPTA  Inhale 1 puff into the lungs daily.   vitamin B-12 500 MCG tablet Commonly known as: CYANOCOBALAMIN Take 500 mcg by mouth daily.        Follow-up Information     Lesa Jon HERO, PA Follow up in 1 week(s).   Specialty: Physician Assistant Contact information: [address] [place] [address] [phone]                Allergies[1]  Consultations: None   Procedures/Studies: DG Chest 2 View Result Date: 07/09/2024 EXAM: 2 VIEW(S) XRAY OF THE CHEST 07/09/2024 09:06:05 PM COMPARISON: None available. CLINICAL HISTORY: SOB FINDINGS: LUNGS  AND PLEURA: No focal pulmonary opacity. No pleural effusion. No pneumothorax. HEART AND MEDIASTINUM: Left main pulmonary artery enlargement. Aortic atherosclerosis. BONES AND SOFT TISSUES: No acute osseous abnormality. IMPRESSION: 1. No acute findings. 2. Left main pulmonary artery enlargement. 3. Aortic atherosclerosis. Electronically signed by: Greig Pique MD 07/09/2024 09:20 PM EST RP Workstation: [id number]     Discharge Exam: Vitals:   07/11/24 0646 07/11/24 0700  BP:  102/66  Pulse:  76  Resp: (!) 21 (!) 28  Temp:    SpO2:  98%   Vitals:   07/11/24 0630 07/11/24 0645 07/11/24 0646 07/11/24 0700  BP:  106/67  102/66  Pulse:  77  76  Resp: (!) 30 (!) 26 (!) 21 (!) 28  Temp:      TempSrc:      SpO2:  100%  98%    General: Pt is alert, awake, not in acute distress Cardiovascular: RRR, S1/S2 +, no rubs, no gallops Respiratory: CTA bilaterally, no wheezing, no rhonchi Abdominal: Soft, NT, ND, bowel sounds + Extremities: no edema, no cyanosis    The results of significant diagnostics from this hospitalization (including imaging, microbiology, ancillary and laboratory) are listed below for reference.     Microbiology: Recent Results  (from the past 240 hours)  Resp panel by RT-PCR (RSV, Flu A&B, Covid) Anterior Nasal Swab     Status: None   Collection Time: 07/09/24  9:09 PM   Specimen: Anterior Nasal Swab  Result Value Ref Range Status   SARS Coronavirus 2 by RT PCR NEGATIVE NEGATIVE Final   Influenza A by PCR NEGATIVE NEGATIVE Final   Influenza B by PCR NEGATIVE NEGATIVE Final    Comment: (NOTE) The Xpert Xpress SARS-CoV-2/FLU/RSV plus assay is intended as an aid in the diagnosis of influenza from Nasopharyngeal swab specimens and should not be used as a sole basis for treatment. Nasal washings and aspirates are unacceptable for Xpert Xpress SARS-CoV-2/FLU/RSV testing.  Fact Sheet for Patients: bloggercourse.com  Fact Sheet for Healthcare Providers: seriousbroker.it  This test is not yet approved or cleared by the [place]  FDA and has been authorized for detection and/or diagnosis of SARS-CoV-2 by FDA under an Emergency Use Authorization (EUA). This EUA will remain in effect (meaning this test can be used) for the duration of the COVID-19 declaration under Section 564(b)(1) of the Act, 21 U.S.C. section 360bbb-3(b)(1), unless the authorization is terminated or revoked.     Resp Syncytial Virus by PCR NEGATIVE NEGATIVE Final    Comment: (NOTE) Fact Sheet for Patients: bloggercourse.com  Fact Sheet for Healthcare Providers: seriousbroker.it  This test is not yet approved or cleared by the [place]  FDA and has been authorized for detection and/or diagnosis of SARS-CoV-2 by FDA under an Emergency Use Authorization (EUA). This EUA will remain in effect (meaning this test can be used) for the duration of the COVID-19 declaration under Section 564(b)(1) of the Act, 21 U.S.C. section 360bbb-3(b)(1), unless the authorization is terminated or revoked.  Performed at [facility], [address] [place], [place] [address]      Labs: BNP (last 3 results) No results for input(s): BNP in the last 8760 hours. Basic Metabolic Panel: Recent Labs  Lab 07/09/24 2046 07/10/24 0230  NA 133* 133*  K 3.9 4.4  CL 95* 93*  CO2 25 33*  GLUCOSE 115* 99  BUN 15 17  CREATININE 1.44* 1.47*  CALCIUM  9.0 9.2   Liver Function Tests: No results for input(s): AST, ALT, ALKPHOS,  BILITOT, PROT, ALBUMIN in the last 168 hours. No results for input(s): LIPASE, AMYLASE in the last 168 hours. No results for input(s): AMMONIA in the last 168 hours. CBC: Recent Labs  Lab 07/09/24 2046 07/10/24 0230  WBC 12.3* 13.0*  HGB 14.0 13.7  HCT 40.7 40.2  MCV 92.7 92.4  PLT 144* 157   Cardiac Enzymes: No results for input(s): CKTOTAL, CKMB, CKMBINDEX, TROPONINI in the last 168 hours. BNP: Invalid input(s): POCBNP CBG: No results for input(s): GLUCAP in the last 168 hours. D-Dimer No results for input(s): DDIMER in the last 72 hours. Hgb A1c No results for input(s): HGBA1C in the last 72 hours. Lipid Profile No results for input(s): CHOL, HDL, LDLCALC, TRIG, CHOLHDL, LDLDIRECT in the last 72 hours. Thyroid function studies No results for input(s): TSH, T4TOTAL, T3FREE, THYROIDAB in the last 72 hours.  Invalid input(s): FREET3 Anemia work up No results for input(s): VITAMINB12, FOLATE, FERRITIN, TIBC, IRON, RETICCTPCT in the last 72 hours. Urinalysis    Component Value Date/Time   COLORURINE AMBER (A) 11/15/2021 1740   APPEARANCEUR CLOUDY (A) 11/15/2021 1740   LABSPEC 1.017 11/15/2021 1740   PHURINE 5.0 11/15/2021 1740   GLUCOSEU NEGATIVE 11/15/2021 1740   HGBUR LARGE (A) 11/15/2021 1740   BILIRUBINUR NEGATIVE 11/15/2021 1740   KETONESUR 5 (A) 11/15/2021 1740   PROTEINUR 100 (A) 11/15/2021 1740   NITRITE NEGATIVE 11/15/2021 1740   LEUKOCYTESUR MODERATE (A) 11/15/2021 1740   Sepsis Labs Recent Labs  Lab  07/09/24 2046 07/10/24 0230  WBC 12.3* 13.0*   Microbiology Recent Results (from the past 240 hours)  Resp panel by RT-PCR (RSV, Flu A&B, Covid) Anterior Nasal Swab     Status: None   Collection Time: 07/09/24  9:09 PM   Specimen: Anterior Nasal Swab  Result Value Ref Range Status   SARS Coronavirus 2 by RT PCR NEGATIVE NEGATIVE Final   Influenza A by PCR NEGATIVE NEGATIVE Final   Influenza B by PCR NEGATIVE NEGATIVE Final    Comment: (NOTE) The Xpert Xpress SARS-CoV-2/FLU/RSV plus assay is intended as an aid in the diagnosis of influenza from Nasopharyngeal swab specimens and should not be used as a sole basis for treatment. Nasal washings and aspirates are unacceptable for Xpert Xpress SARS-CoV-2/FLU/RSV testing.  Fact Sheet for Patients: bloggercourse.com  Fact Sheet for Healthcare Providers: seriousbroker.it  This test is not yet approved or cleared by the [place]  FDA and has been authorized for detection and/or diagnosis of SARS-CoV-2 by FDA under an Emergency Use Authorization (EUA). This EUA will remain in effect (meaning this test can be used) for the duration of the COVID-19 declaration under Section 564(b)(1) of the Act, 21 U.S.C. section 360bbb-3(b)(1), unless the authorization is terminated or revoked.     Resp Syncytial Virus by PCR NEGATIVE NEGATIVE Final    Comment: (NOTE) Fact Sheet for Patients: bloggercourse.com  Fact Sheet for Healthcare Providers: seriousbroker.it  This test is not yet approved or cleared by the [place]  FDA and has been authorized for detection and/or diagnosis of SARS-CoV-2 by FDA under an Emergency Use Authorization (EUA). This EUA will remain in effect (meaning this test can be used) for the duration of the COVID-19 declaration under Section 564(b)(1) of the Act, 21 U.S.C. section 360bbb-3(b)(1), unless the authorization  is terminated or revoked.  Performed at [facility], [address] [place], [place] [address]     FURTHER DISCHARGE INSTRUCTIONS:   Get Medicines reviewed and adjusted: Please take all your medications  with you for your next visit with your Primary MD   Laboratory/radiological data: Please request your Primary MD to go over all hospital tests and procedure/radiological results at the follow up, please ask your Primary MD to get all Hospital records sent to his/her office.   In some cases, they will be blood work, cultures and biopsy results pending at the time of your discharge. Please request that your primary care M.D. goes through all the records of your hospital data and follows up on these results.   Also Note the following: If you experience worsening of your admission symptoms, develop shortness of breath, life threatening emergency, suicidal or homicidal thoughts you must seek medical attention immediately by calling 911 or calling your MD immediately  if symptoms less severe.   You must read complete instructions/literature along with all the possible adverse reactions/side effects for all the Medicines you take and that have been prescribed to you. Take any new Medicines after you have completely understood and accpet all the possible adverse reactions/side effects.    patient was instructed, not to drive, operate heavy machinery, perform activities at heights, swimming or participation in water activities or provide baby-sitting services while on Pain, Sleep and Anxiety Medications; until their outpatient Physician has advised to do so again. Also recommended to not to take more than prescribed Pain, Sleep and Anxiety Medications.  It is not advisable to combine anxiety, sleep and pain medications without talking with your primary care provider.     Wear Seat belts while driving.   Please note: You were cared for by a hospitalist during your hospital stay. Once you are  discharged, your primary care physician will handle any further medical issues. Please note that NO REFILLS for any discharge medications will be authorized once you are discharged, as it is imperative that you return to your primary care physician (or establish a relationship with a primary care physician if you do not have one) for your post hospital discharge needs so that they can reassess your need for medications and monitor your lab values  Time coordinating discharge: Over 30 minutes  SIGNED:   Fredia Skeeter, MD  [facility] 07/11/2024, 10:37 AM *Please note that this is a verbal dictation therefore any spelling or grammatical errors are due to the Dragon Medical One system interpretation. If 7PM-7AM, please contact night-coverage [email or web address]     [1] No Known Allergies

## 2024-07-11 NOTE — ED Notes (Signed)
"  Water given   "

## 2024-07-11 NOTE — Care Management (Addendum)
" °  Transition of Care [facility]) Screening Note   Patient Details  Name: Aaron Fuller Date of Birth: 26-Sep-1955   Transition of Care [facility]) CM/SW Contact:    Corean JAYSON Canary, RN Phone Number: 07/11/2024, 9:26 AM    Transition of Care Department [facility]) has reviewed patient and no TOC needs have been identified at this time. We will continue to monitor patient advancement through interdisciplinary progression rounds. If new patient transition needs arise, please place a TOC consult.  1110 Oxygen Tank is on the way for discharge "

## 2024-07-15 NOTE — Progress Notes (Unsigned)
 " Cardiology Office Note:  .   Date:  07/15/2024  ID:  Aaron Fuller, DOB 02-22-1956, MRN [id number] PCP: Lesa Jon HERO, PA  [facility] Providers Cardiologist:  Wilbert Bihari, MD {  History of Present Illness: Aaron Fuller   Aaron Fuller is a 69 y.o. male with a past medical history of HTN, alcohol dependence, tobacco abuse, PAF on chronic anticoagulation, chronic HFpEF, normal coronary arteries by cardiac cath 09/2019, pulmonary hypertension, COPD, celiac artery stenosis and hepatitis C here for follow-up appointment.  Seen by Dr. Bihari 07/06/2020.  Admission May 2023, cardiology consult for abnormal echocardiogram.  2D echo 11/13/2021 reviewed LVEF 44 5%, global hypokinesis of LV, severe LVH, grade 2 DD, interventricular septum flattening consistent with right ventricular volume overload.  Right ventricle systolic function moderately reduced, RV moderately enlarged, mild increased right ventricular wall thickness, moderately elevated pulmonary artery systolic pressure, RVSP 51.7 mmHg, mildly dilated LA, severely dilated RA, small pericardial effusion with no evidence of Tebben on, moderate PR, severe TR, AI mild to moderate.  Was advised to follow-up.  Was seen for follow-up November 2023 by Michelle's 1 year, NP.  Reported he had been doing okay.  Continues smoke 10 cigarettes a day.  Running out of medicines and has transportation issues.  Sleeps with oxygen.  Unfortunately was blowing leaves a day prior and had to stop because of shortness of breath.  Admits he has not been very active recently and may be deconditioned.  He did deny orthopnea, PND, edema, chest pain, palpitations, syncope/presyncope.  No bleeding concerns.  I saw him 11/24, he has a history of lung disease and hypertension, presents with intermittent chest tightness. The discomfort is sometimes associated with activity, but primarily occurs during sleep or upon waking. He also reports shortness of breath. The patient continues to  smoke and is currently on inhalers for lung disease management. The patient's blood pressure and heart rate are consistently high, despite being on metoprolol . He also reports a need for podiatry care due to difficulty managing toenails. He is on 5L [place] today during our encounter and this is his home dose but he does not have his own oxygen today. HR is also elevated but just took his 100mg  of metoprolol  right before he came to his appointment.   No edema, orthopnea, PND. Reports no palpitations.   Discussed the use of AI scribe software for clinical note transcription with the patient, who gave verbal consent to proceed.  Today, he ***  Reports no shortness of breath nor dyspnea on exertion. Reports no chest pain, pressure, or tightness. No edema, orthopnea, PND. Reports no palpitations.   Discussed the use of AI scribe software for clinical note transcription with the patient, who gave verbal consent to proceed.  ROS: Pertinent ROS in HPI  Studies Reviewed: .        Echo 11/13/21   1. Left ventricular ejection fraction, by estimation, is 40 to 45%. The  left ventricle has mildly decreased function. The left ventricle  demonstrates global hypokinesis. There is severe concentric left  ventricular hypertrophy. Left ventricular diastolic   parameters are consistent with Grade II diastolic dysfunction  (pseudonormalization). There is the interventricular septum is flattened  in diastole ('D' shaped left ventricle), consistent with right ventricular  volume overload.   2. Right ventricular systolic function is moderately reduced. The right  ventricular size is moderately enlarged. Mildly increased right  ventricular wall thickness. There is moderately elevated pulmonary artery  systolic pressure. The estimated right  ventricular systolic pressure is 51.7 mmHg.   3. Left atrial size was mildly dilated.   4. Right atrial size was severely dilated.   5. A small pericardial effusion is  present. No evidence of tamponade.   6. The mitral valve is grossly normal. Trivial mitral valve  regurgitation.   7. Tricuspid valve regurgitation is severe.   8. The aortic valve is tricuspid. Aortic valve regurgitation is mild to  moderate.   9. Pulmonic valve regurgitation is moderate.  10. Moderately dilated pulmonary artery.  11. The inferior vena cava is dilated in size with <50% respiratory  variability, suggesting right atrial pressure of 15 mmHg.   Comparison(s): Significant changes: worsening LVEF with torrential,  functional tricuspid regurgitation.   Conclusion(s)/Recommendation(s): Notable hypertrophy, atrial dilation, and  effusion: amyloid nuclear imaging may be indicated as outpatient.        Physical Exam:   VS:  There were no vitals taken for this visit.   Wt Readings from Last 3 Encounters:  01/14/24 168 lb (76.2 kg)  01/06/24 168 lb 9.6 oz (76.5 kg)  04/29/23 172 lb 12.8 oz (78.4 kg)    GEN: Well nourished, well developed in no acute distress NECK: No JVD; No carotid bruits CARDIAC: RRR, no murmurs, rubs, gallops RESPIRATORY:  Clear to auscultation without rales, wheezing or rhonchi  ABDOMEN: Soft, non-tender, non-distended EXTREMITIES:  No edema; No deformity   ASSESSMENT AND PLAN: .   PAF -no further issues -he is on metoprolol  BID  Chronic combined CHF -No swelling in his legs -continue current medications, no need for additional diuretics at this moment  Chest Pain Intermittent chest tightness, worse at night and sometimes with activity. No associated dyspnea. EKG shows tachycardia. -Order chemical stress test to evaluate for ischemic heart disease. -Prescribe nitroglycerin  sublingual tablets for use as needed for chest pain.  Hypertension Blood pressure elevated at 172/88 despite current treatment with metoprolol  100mg  in the morning and 25mg  in the evening. -Add amlodipine  5mg  daily. -Advise patient's wife to monitor blood pressure at home  for the next two weeks, an hour after morning and evening medications, and report readings.  COPD/tobacco dependence Patient reports running out of inhaler medication. -Refill inhaler prescriptions for one month until follow-up with primary care provider. -Refill nicotine  patches 14mg  for smoking cessation.       Dispo: Please return in 3 months for follow-up with me.  Signed, Orren LOISE Fabry, PA-C   "

## 2024-07-16 ENCOUNTER — Ambulatory Visit: Admitting: Physician Assistant

## 2024-07-16 DIAGNOSIS — I272 Pulmonary hypertension, unspecified: Secondary | ICD-10-CM

## 2024-07-16 DIAGNOSIS — E785 Hyperlipidemia, unspecified: Secondary | ICD-10-CM

## 2024-07-16 DIAGNOSIS — I10 Essential (primary) hypertension: Secondary | ICD-10-CM

## 2024-07-16 DIAGNOSIS — I4892 Unspecified atrial flutter: Secondary | ICD-10-CM

## 2024-07-16 DIAGNOSIS — I5032 Chronic diastolic (congestive) heart failure: Secondary | ICD-10-CM

## 2024-07-16 DIAGNOSIS — F17200 Nicotine dependence, unspecified, uncomplicated: Secondary | ICD-10-CM
